# Patient Record
Sex: Male | Born: 1945 | Race: Black or African American | Hispanic: No | Marital: Married | Smoking: Never smoker
Health system: Southern US, Community
[De-identification: ages and names within clinical notes are randomized; demographics above are authoritative.]

## PROBLEM LIST (undated history)

## (undated) DIAGNOSIS — E119 Type 2 diabetes mellitus without complications: Secondary | ICD-10-CM

## (undated) DIAGNOSIS — H269 Unspecified cataract: Secondary | ICD-10-CM

## (undated) DIAGNOSIS — K219 Gastro-esophageal reflux disease without esophagitis: Secondary | ICD-10-CM

## (undated) DIAGNOSIS — F329 Major depressive disorder, single episode, unspecified: Secondary | ICD-10-CM

## (undated) DIAGNOSIS — I10 Essential (primary) hypertension: Secondary | ICD-10-CM

## (undated) DIAGNOSIS — F419 Anxiety disorder, unspecified: Secondary | ICD-10-CM

## (undated) DIAGNOSIS — G8929 Other chronic pain: Secondary | ICD-10-CM

## (undated) DIAGNOSIS — K5790 Diverticulosis of intestine, part unspecified, without perforation or abscess without bleeding: Secondary | ICD-10-CM

## (undated) DIAGNOSIS — T7840XA Allergy, unspecified, initial encounter: Secondary | ICD-10-CM

## (undated) DIAGNOSIS — H409 Unspecified glaucoma: Secondary | ICD-10-CM

## (undated) DIAGNOSIS — K589 Irritable bowel syndrome without diarrhea: Secondary | ICD-10-CM

## (undated) DIAGNOSIS — F32A Depression, unspecified: Secondary | ICD-10-CM

## (undated) DIAGNOSIS — J45909 Unspecified asthma, uncomplicated: Secondary | ICD-10-CM

## (undated) DIAGNOSIS — J189 Pneumonia, unspecified organism: Secondary | ICD-10-CM

## (undated) DIAGNOSIS — R51 Headache: Secondary | ICD-10-CM

## (undated) DIAGNOSIS — G4730 Sleep apnea, unspecified: Secondary | ICD-10-CM

## (undated) DIAGNOSIS — E785 Hyperlipidemia, unspecified: Secondary | ICD-10-CM

## (undated) DIAGNOSIS — R519 Headache, unspecified: Secondary | ICD-10-CM

## (undated) DIAGNOSIS — I499 Cardiac arrhythmia, unspecified: Secondary | ICD-10-CM

## (undated) DIAGNOSIS — J449 Chronic obstructive pulmonary disease, unspecified: Secondary | ICD-10-CM

## (undated) HISTORY — DX: Headache: R51

## (undated) HISTORY — DX: Other chronic pain: G89.29

## (undated) HISTORY — DX: Hyperlipidemia, unspecified: E78.5

## (undated) HISTORY — DX: Allergy, unspecified, initial encounter: T78.40XA

## (undated) HISTORY — DX: Chronic obstructive pulmonary disease, unspecified: J44.9

## (undated) HISTORY — DX: Headache, unspecified: R51.9

## (undated) HISTORY — DX: Cardiac arrhythmia, unspecified: I49.9

## (undated) HISTORY — DX: Depression, unspecified: F32.A

## (undated) HISTORY — DX: Unspecified glaucoma: H40.9

## (undated) HISTORY — DX: Type 2 diabetes mellitus without complications: E11.9

## (undated) HISTORY — DX: Pneumonia, unspecified organism: J18.9

## (undated) HISTORY — DX: Unspecified cataract: H26.9

## (undated) HISTORY — DX: Sleep apnea, unspecified: G47.30

## (undated) HISTORY — PX: OTHER SURGICAL HISTORY: SHX169

## (undated) HISTORY — DX: Diverticulosis of intestine, part unspecified, without perforation or abscess without bleeding: K57.90

## (undated) HISTORY — DX: Anxiety disorder, unspecified: F41.9

## (undated) HISTORY — DX: Gastro-esophageal reflux disease without esophagitis: K21.9

## (undated) HISTORY — PX: COLONOSCOPY: SHX174

## (undated) HISTORY — DX: Major depressive disorder, single episode, unspecified: F32.9

## (undated) HISTORY — DX: Irritable bowel syndrome, unspecified: K58.9

## (undated) HISTORY — DX: Essential (primary) hypertension: I10

## (undated) HISTORY — PX: ESOPHAGOGASTRODUODENOSCOPY ENDOSCOPY: SHX5814

## (undated) HISTORY — DX: Unspecified asthma, uncomplicated: J45.909

---

## 1999-04-22 ENCOUNTER — Ambulatory Visit (HOSPITAL_COMMUNITY): Admission: RE | Admit: 1999-04-22 | Discharge: 1999-04-22 | Payer: Self-pay | Admitting: Gastroenterology

## 2000-06-08 ENCOUNTER — Emergency Department (HOSPITAL_COMMUNITY): Admission: EM | Admit: 2000-06-08 | Discharge: 2000-06-08 | Payer: Self-pay | Admitting: Emergency Medicine

## 2000-06-11 ENCOUNTER — Encounter: Payer: Self-pay | Admitting: Pulmonary Disease

## 2000-06-11 ENCOUNTER — Ambulatory Visit (HOSPITAL_COMMUNITY): Admission: RE | Admit: 2000-06-11 | Discharge: 2000-06-11 | Payer: Self-pay | Admitting: Pulmonary Disease

## 2001-04-13 ENCOUNTER — Encounter: Payer: Self-pay | Admitting: Urology

## 2001-04-13 ENCOUNTER — Encounter: Admission: RE | Admit: 2001-04-13 | Discharge: 2001-04-13 | Payer: Self-pay | Admitting: Urology

## 2001-05-20 ENCOUNTER — Ambulatory Visit (HOSPITAL_BASED_OUTPATIENT_CLINIC_OR_DEPARTMENT_OTHER): Admission: RE | Admit: 2001-05-20 | Discharge: 2001-05-20 | Payer: Self-pay | Admitting: Urology

## 2002-12-19 ENCOUNTER — Ambulatory Visit (HOSPITAL_COMMUNITY): Admission: RE | Admit: 2002-12-19 | Discharge: 2002-12-19 | Payer: Self-pay | Admitting: Pulmonary Disease

## 2002-12-19 ENCOUNTER — Encounter: Payer: Self-pay | Admitting: Pulmonary Disease

## 2003-02-03 ENCOUNTER — Ambulatory Visit (HOSPITAL_COMMUNITY): Admission: RE | Admit: 2003-02-03 | Discharge: 2003-02-03 | Payer: Self-pay | Admitting: Pulmonary Disease

## 2003-02-03 ENCOUNTER — Encounter: Payer: Self-pay | Admitting: Pulmonary Disease

## 2005-04-15 ENCOUNTER — Ambulatory Visit: Payer: Self-pay | Admitting: Gastroenterology

## 2005-04-30 ENCOUNTER — Encounter (INDEPENDENT_AMBULATORY_CARE_PROVIDER_SITE_OTHER): Payer: Self-pay | Admitting: *Deleted

## 2005-04-30 ENCOUNTER — Ambulatory Visit: Payer: Self-pay | Admitting: Gastroenterology

## 2005-11-06 ENCOUNTER — Ambulatory Visit (HOSPITAL_COMMUNITY): Admission: RE | Admit: 2005-11-06 | Discharge: 2005-11-06 | Payer: Self-pay | Admitting: Pulmonary Disease

## 2005-11-18 ENCOUNTER — Ambulatory Visit: Payer: Self-pay | Admitting: Gastroenterology

## 2005-11-19 ENCOUNTER — Encounter (INDEPENDENT_AMBULATORY_CARE_PROVIDER_SITE_OTHER): Payer: Self-pay | Admitting: *Deleted

## 2005-11-19 ENCOUNTER — Ambulatory Visit: Payer: Self-pay | Admitting: Gastroenterology

## 2005-12-05 ENCOUNTER — Ambulatory Visit: Payer: Self-pay | Admitting: Gastroenterology

## 2006-07-14 HISTORY — PX: ROTATOR CUFF REPAIR: SHX139

## 2008-07-14 HISTORY — PX: LUMBAR SPINE SURGERY: SHX701

## 2008-12-04 ENCOUNTER — Encounter: Admission: RE | Admit: 2008-12-04 | Discharge: 2008-12-04 | Payer: Self-pay | Admitting: Pulmonary Disease

## 2008-12-13 ENCOUNTER — Encounter: Admission: RE | Admit: 2008-12-13 | Discharge: 2008-12-13 | Payer: Self-pay | Admitting: Orthopedic Surgery

## 2009-01-08 ENCOUNTER — Encounter: Admission: RE | Admit: 2009-01-08 | Discharge: 2009-01-30 | Payer: Self-pay | Admitting: Orthopedic Surgery

## 2009-03-09 ENCOUNTER — Ambulatory Visit (HOSPITAL_COMMUNITY): Admission: RE | Admit: 2009-03-09 | Discharge: 2009-03-09 | Payer: Self-pay | Admitting: Pulmonary Disease

## 2009-04-05 ENCOUNTER — Encounter: Admission: RE | Admit: 2009-04-05 | Discharge: 2009-05-05 | Payer: Self-pay | Admitting: Orthopedic Surgery

## 2009-04-22 ENCOUNTER — Emergency Department (HOSPITAL_COMMUNITY): Admission: EM | Admit: 2009-04-22 | Discharge: 2009-04-22 | Payer: Self-pay | Admitting: Emergency Medicine

## 2009-04-25 ENCOUNTER — Ambulatory Visit (HOSPITAL_COMMUNITY): Admission: RE | Admit: 2009-04-25 | Discharge: 2009-04-25 | Payer: Self-pay | Admitting: Pulmonary Disease

## 2009-04-26 ENCOUNTER — Ambulatory Visit (HOSPITAL_COMMUNITY): Admission: RE | Admit: 2009-04-26 | Discharge: 2009-04-26 | Payer: Self-pay | Admitting: Pulmonary Disease

## 2009-04-27 ENCOUNTER — Ambulatory Visit (HOSPITAL_COMMUNITY): Admission: RE | Admit: 2009-04-27 | Discharge: 2009-04-28 | Payer: Self-pay | Admitting: Neurosurgery

## 2010-04-10 ENCOUNTER — Ambulatory Visit (HOSPITAL_BASED_OUTPATIENT_CLINIC_OR_DEPARTMENT_OTHER): Admission: RE | Admit: 2010-04-10 | Discharge: 2010-04-10 | Payer: Self-pay | Admitting: Pulmonary Disease

## 2010-04-13 ENCOUNTER — Ambulatory Visit: Payer: Self-pay | Admitting: Internal Medicine

## 2010-10-17 LAB — CBC
MCHC: 34.2 g/dL (ref 30.0–36.0)
MCV: 91.4 fL (ref 78.0–100.0)
Platelets: 274 10*3/uL (ref 150–400)
RDW: 12.8 % (ref 11.5–15.5)
WBC: 6.7 10*3/uL (ref 4.0–10.5)

## 2010-10-17 LAB — BASIC METABOLIC PANEL
CO2: 27 mEq/L (ref 19–32)
GFR calc Af Amer: >60 mL/min (ref >60)
GFR calc non Af Amer: >60 mL/min (ref >60)
Glucose, Bld: 133 mg/dL — ABNORMAL HIGH (ref 70–99)
Sodium: 141 mEq/L (ref 135–145)

## 2010-10-17 LAB — GLUCOSE, CAPILLARY
Glucose-Capillary: 121 mg/dL — ABNORMAL HIGH (ref 70–99)
Glucose-Capillary: 110 mg/dL — ABNORMAL HIGH (ref 70–99)

## 2010-10-17 LAB — POCT I-STAT, CHEM 8
BUN: 14 mg/dL (ref 6–23)
Creatinine, Ser: 1 mg/dL (ref 0.4–1.5)
HCT: 42 % (ref 39.0–52.0)
Sodium: 141 mEq/L (ref 135–145)

## 2010-11-29 NOTE — Op Note (Signed)
[facility]  Patient:    Derek [name], Derek [name] Visit Number: [id number] MRN: [id number]          Service Type: NES Location: [facility] Attending Physician:  Lindaann Slough Proc. Date: 05/20/01 Admit Date:  05/20/2001                             Operative Report  PREOPERATIVE DIAGNOSES: 1. Urinary tract infection. 2. Benign prostatic hypertrophy.  POSTOPERATIVE DIAGNOSES: 1. Urinary tract infection. 2. Benign prostatic hypertrophy. 3. Bladder diverticula.  PROCEDURE:  Cystoscopy.  SURGEON:  Lindaann Slough, M.D.  ANESTHESIA:  General.  INDICATION:  The patient is a 65 year old male, who [facility]  He had symptoms of frequency, hesitancy, and straining on urination.  An IVP showed a cyst in the right kidney and a bladder diverticulum.  The patient is scheduled today for cystoscopy.  DESCRIPTION OF PROCEDURE:  Under general anesthesia, the patient was prepped and draped and placed in the dorsal lithotomy position.  A #22 Wappler cystoscope was inserted in the bladder.  The patient has trilobar prostatic hypertrophy.  The bladder is trabeculated.  There are two diverticula in the bladder.  Each diverticulum is above each ureteral orifice.  There is no stone or tumor in the bladder nor in the diverticula.  The ureteral orifices are in normal position and shape with clear efflux.  The bladder was then emptied and the cystoscope removed.  The patient tolerated the procedure well and left the OR in satisfactory condition to postanesthesia care unit. Attending Physician:  Lindaann Slough DD:  05/20/01 TD:  05/21/01 Job: [id number]

## 2011-08-29 ENCOUNTER — Other Ambulatory Visit: Payer: Self-pay | Admitting: Dermatology

## 2011-11-13 ENCOUNTER — Other Ambulatory Visit: Payer: Self-pay | Admitting: Neurosurgery

## 2011-11-13 DIAGNOSIS — M5416 Radiculopathy, lumbar region: Secondary | ICD-10-CM

## 2011-11-24 ENCOUNTER — Ambulatory Visit
Admission: RE | Admit: 2011-11-24 | Discharge: 2011-11-24 | Disposition: A | Discharge status: Home / Self Care | Payer: Medicare Other | Source: Ambulatory Visit | Attending: Neurosurgery | Admitting: Neurosurgery

## 2011-11-24 DIAGNOSIS — M5416 Radiculopathy, lumbar region: Secondary | ICD-10-CM

## 2011-11-24 MED ORDER — GADOBENATE DIMEGLUMINE 529 MG/ML IV SOLN
17.0000 mL | Freq: Once | INTRAVENOUS | Status: AC | PRN
  Administered 2011-11-24: 17 mL via INTRAVENOUS

## 2012-09-04 ENCOUNTER — Ambulatory Visit (INDEPENDENT_AMBULATORY_CARE_PROVIDER_SITE_OTHER): Payer: Medicare Other | Admitting: Family Medicine

## 2012-09-04 VITALS — BP 130/83 | HR 98 | Temp 98.5°F | Resp 12 | Ht 69.0 in | Wt 178.0 lb

## 2012-09-04 DIAGNOSIS — R197 Diarrhea, unspecified: Secondary | ICD-10-CM

## 2012-09-04 DIAGNOSIS — R1032 Left lower quadrant pain: Secondary | ICD-10-CM

## 2012-09-04 DIAGNOSIS — Z8719 Personal history of other diseases of the digestive system: Secondary | ICD-10-CM

## 2012-09-04 DIAGNOSIS — K5732 Diverticulitis of large intestine without perforation or abscess without bleeding: Secondary | ICD-10-CM

## 2012-09-04 LAB — POCT CBC
Hemoglobin: 14.4 g/dL (ref 14.1–18.1)
Lymph, poc: 1.6 (ref 0.6–3.4)
MCHC: 32.3 g/dL (ref 31.8–35.4)
MID (cbc): 0.6 (ref 0–0.9)
MPV: 10.5 fL (ref 0–99.8)
POC Granulocyte: 4.7 (ref 2–6.9)
POC LYMPH PERCENT: 23.2 %L (ref 10–50)
POC MID %: 8.1 %M (ref 0–12)
Platelet Count, POC: 302 10*3/uL (ref 142–424)
RDW, POC: 13.4 %

## 2012-09-04 LAB — COMPREHENSIVE METABOLIC PANEL
ALT: 24 U/L (ref 0–53)
AST: 22 U/L (ref 0–37)
Alkaline Phosphatase: 108 U/L (ref 39–117)
Creat: 0.87 mg/dL (ref 0.50–1.35)
Sodium: 139 mEq/L (ref 135–145)
Total Bilirubin: 0.4 mg/dL (ref 0.3–1.2)
Total Protein: 7.8 g/dL (ref 6.0–8.3)

## 2012-09-04 MED ORDER — CIPROFLOXACIN HCL 500 MG PO TABS: 500.0000 mg | ORAL_TABLET | Freq: Two times a day (BID) | ORAL | Status: DC

## 2012-09-04 MED ORDER — DICYCLOMINE HCL 10 MG PO CAPS: ORAL_CAPSULE | ORAL | Status: DC

## 2012-09-04 NOTE — Progress Notes (Signed)
Subjective: 67 year old man who is here with history of diarrhea for the past 10 days or so. He been doing okay before that. Over 20 years ago he had some problem with ulcers. He was treated for that. Off and on he has been had having some episodes of diarrhea. A few years ago he may have had a ten-day stretch of it. However over the last 10 days he is persisted with loose bowel movements, sometimes 3 or more times a day. They're usually a dumping syndrome type of thing. When he eats, he gets pain in his left lower quadrant about an hour later. He then moves his bowels and feels better. He's not been seen blood is a little bit of pain 1 day, just a speck of it.   He is retired, generally does pretty well. He is on medication for his blood pressure, nerves and and a few other things. He last had a colonoscopy about 7 years ago. He did have some diverticulosis on that. He has been drinking Ensure and Atkins Adavantage.  He doesn't have much of an appetite. However his weight is about at the lower end of the usual range that he has.  Objective: Well-developed well-nourished alert gentleman in no obvious distress. Neck supple without nodes. Throat clear. Chest clear. Heart regular without murmurs. And soft without masses or major tenderness. Mild tenderness in the left mid descending colon region. Bowel sounds a little bit active. Digital rectal exam was normal with an unremarkable prostate gland. Only trace of stool was present, but Hemoccult was taken.  Assessment: Left abdominal pain Diarrhea History of peptic ulcer disease History of diverticulosis/diverticulitis History of irritable bowel syndrome  Plan: Check labs and decide treat  Results for orders placed in visit on 09/04/12  POCT CBC      Result Value Range   WBC 6.9  4.6 - 10.2 K/uL   Lymph, poc 1.6  0.6 - 3.4   POC LYMPH PERCENT 23.2  10 - 50 %L   MID (cbc) 0.6  0 - 0.9   POC MID % 8.1  0 - 12 %M   POC Granulocyte 4.7  2 - 6.9   Granulocyte percent 68.7  37 - 80 %G   RBC 4.88  4.69 - 6.13 M/uL   Hemoglobin 14.4  14.1 - 18.1 g/dL   HCT, POC 16.1  09.6 - 53.7 %   MCV 91.3  80 - 97 fL   MCH, POC 29.5  27 - 31.2 pg   MCHC 32.3  31.8 - 35.4 g/dL   RDW, POC 04.5     Platelet Count, POC 302  142 - 424 K/uL   MPV 10.5  0 - 99.8 fL  IFOBT (OCCULT BLOOD)      Result Value Range   IFOBT Negative     Irritable bowel syndrome Normal diverticulitis  Plan: Bentyl 10 before meals and at bedtime Cipro 500 twice a day  Return for

## 2012-09-04 NOTE — Patient Instructions (Signed)
Continue to drink plenty of fluids.  Avoid dairy products   Gradually advance diet with bland type foods initially  Return if worse

## 2012-09-06 ENCOUNTER — Encounter: Payer: Self-pay | Admitting: *Deleted

## 2013-03-07 ENCOUNTER — Other Ambulatory Visit: Payer: Self-pay | Admitting: *Deleted

## 2013-03-07 DIAGNOSIS — E119 Type 2 diabetes mellitus without complications: Secondary | ICD-10-CM | POA: Insufficient documentation

## 2013-03-08 ENCOUNTER — Other Ambulatory Visit (INDEPENDENT_AMBULATORY_CARE_PROVIDER_SITE_OTHER): Payer: Medicare Other

## 2013-03-08 ENCOUNTER — Other Ambulatory Visit: Payer: Self-pay | Admitting: *Deleted

## 2013-03-08 DIAGNOSIS — E119 Type 2 diabetes mellitus without complications: Secondary | ICD-10-CM

## 2013-03-08 LAB — URINALYSIS
Hgb urine dipstick: NEGATIVE
Nitrite: NEGATIVE
Specific Gravity, Urine: 1.015 (ref 1.000–1.030)
Urine Glucose: NEGATIVE
Urobilinogen, UA: 0.2 (ref 0.0–1.0)

## 2013-03-08 LAB — POTASSIUM: Potassium: 3.7 mEq/L (ref 3.5–5.1)

## 2013-03-08 LAB — MICROALBUMIN / CREATININE URINE RATIO
Creatinine,U: 119.1 mg/dL
Microalb, Ur: 0.5 mg/dL (ref 0.0–1.9)

## 2013-03-10 ENCOUNTER — Ambulatory Visit: Payer: PRIVATE HEALTH INSURANCE | Admitting: Endocrinology

## 2013-03-16 ENCOUNTER — Encounter: Payer: Self-pay | Admitting: Endocrinology

## 2013-03-16 ENCOUNTER — Ambulatory Visit (INDEPENDENT_AMBULATORY_CARE_PROVIDER_SITE_OTHER): Payer: Medicare Other | Admitting: Endocrinology

## 2013-03-16 VITALS — BP 118/74 | HR 86 | Temp 98.1°F | Resp 12 | Ht 70.0 in | Wt 184.2 lb

## 2013-03-16 DIAGNOSIS — E7800 Pure hypercholesterolemia, unspecified: Secondary | ICD-10-CM

## 2013-03-16 DIAGNOSIS — E119 Type 2 diabetes mellitus without complications: Secondary | ICD-10-CM

## 2013-03-16 DIAGNOSIS — E785 Hyperlipidemia, unspecified: Secondary | ICD-10-CM | POA: Insufficient documentation

## 2013-03-16 DIAGNOSIS — I10 Essential (primary) hypertension: Secondary | ICD-10-CM

## 2013-03-16 NOTE — Patient Instructions (Signed)
Please check blood sugars at least half the time about 2 hours after any meal and as directed on waking up.   Please bring blood sugar monitor to each visit  

## 2013-03-16 NOTE — Progress Notes (Signed)
Patient ID: Derek [name], male   DOB: [birth year], 67 y.o.   MRN: [id number]  Derek [name] is an 67 y.o. male.   Reason for Appointment: Diabetes follow-up   History of Present Illness   Diagnosis: Type 2 DIABETES MELITUS, date of diagnosis: 2008      Past history: Although his glucose was 600 at diagnosis he was initially treated with metformin and subsequently had relatively milder diabetes Januvia was added to his regimen in 2011  He has had relatively mild diabetes which is consistently well controlled with regimen of Janumet twice a day Usually has upper normal A1c He has checked blood sugar somewhat sporadically and overall does not appear to be as compliant as before      Side effects from medications: None      Monitors blood glucose:  less than once a day.    Glucometer:  FreeStyle           Blood Glucose readings from meter download: readings before breakfast: 118-121,  afternoon 72, evening 100 Hypoglycemia frequency: Never.          Meals: 3 meals per day.  eating out more  and not consistently following diet        Physical activity: exercise:  Less walking recently           Dietician visit: Most recent: Unknown         Complications: are: None    Wt Readings from Last 3 Encounters:  03/16/13 184 lb 3.2 oz (83.553 kg)  09/04/12 178 lb (80.74 kg)    No visits with results within 1 Week(s) from this visit. Latest known visit with results is:  Appointment on 03/08/2013  Component Date Value Range Status  . Hemoglobin A1C 03/08/2013 6.5  4.6 - 6.5 % Final   Glycemic Control Guidelines for People with Diabetes:Non Diabetic:  <6%Goal of Therapy: <7%Additional Action Suggested:  >8%   . Microalb, Ur 03/08/2013 0.5  0.0 - 1.9 mg/dL Final  . Creatinine,U 40/98/1191 119.1   Final  . Microalb Creat Ratio 03/08/2013 0.4  0.0 - 30.0 mg/g Final  . Color, Urine 03/08/2013 LT. YELLOW  Yellow;Lt. Yellow Final  . APPearance 03/08/2013 CLEAR  Clear Final  . Specific Gravity, Urine  03/08/2013 1.015  1.000-1.030 Final  . pH 03/08/2013 6.0  5.0 - 8.0 Final  . Total Protein, Urine 03/08/2013 NEGATIVE  Negative Final  . Urine Glucose 03/08/2013 NEGATIVE  Negative Final  . Ketones, ur 03/08/2013 NEGATIVE  Negative Final  . Bilirubin Urine 03/08/2013 NEGATIVE  Negative Final  . Hgb urine dipstick 03/08/2013 NEGATIVE  Negative Final  . Urobilinogen, UA 03/08/2013 0.2  0.0 - 1.0 Final  . Leukocytes, UA 03/08/2013 NEGATIVE  Negative Final  . Nitrite 03/08/2013 NEGATIVE  Negative Final  . Potassium 03/08/2013 3.7  3.5 - 5.1 mEq/L Final      Medication List       This list is accurate as of: 03/16/13  2:01 PM.  Always use your most recent med list.               aspirin 81 MG tablet  Take 81 mg by mouth daily.     bimatoprost 0.03 % ophthalmic solution  Commonly known as:  LUMIGAN  1 drop at bedtime.     cetirizine 10 MG tablet  Commonly known as:  ZYRTEC  Take 10 mg by mouth daily.     chlordiazePOXIDE 10 MG capsule  Commonly known as:  LIBRIUM  Take 10 mg by mouth daily.     ciprofloxacin 500 MG tablet  Commonly known as:  CIPRO  Take 1 tablet (500 mg total) by mouth 2 (two) times daily.     cyclobenzaprine 5 MG tablet  Commonly known as:  FLEXERIL  Take 5 mg by mouth at bedtime.     diclofenac 50 MG EC tablet  Commonly known as:  VOLTAREN  Take 50 mg by mouth daily.     dicyclomine 10 MG capsule  Commonly known as:  BENTYL  Use one before meals and at bedtime as needed for diarrhea or cramping     esomeprazole 20 MG capsule  Commonly known as:  NEXIUM  Take 20 mg by mouth daily before breakfast.     HYDROcodone-acetaminophen 5-325 MG per tablet  Commonly known as:  NORCO/VICODIN  Take 1 tablet by mouth every 6 (six) hours as needed for pain.     irbesartan-hydrochlorothiazide 150-12.5 MG per tablet  Commonly known as:  AVALIDE  Take 1 tablet by mouth daily.     JANUMET 50-1000 MG per tablet  Generic drug:  sitaGLIPtin-metformin  Take 1  tablet by mouth daily.     metoprolol succinate 25 MG 24 hr tablet  Commonly known as:  TOPROL-XL  Take 25 mg by mouth daily.     potassium chloride 10 MEQ tablet  Commonly known as:  K-DUR,KLOR-CON  Take 10 mEq by mouth 2 (two) times daily.     ranitidine 150 MG capsule  Commonly known as:  ZANTAC  Take 150 mg by mouth 2 (two) times daily.     sertraline 100 MG tablet  Commonly known as:  ZOLOFT  Take 100 mg by mouth daily.     tamsulosin 0.4 MG Caps capsule  Commonly known as:  FLOMAX  Take 0.4 mg by mouth.     TraZODone HCl 150 MG Tb24  Take 150 mg by mouth.     triamcinolone cream 0.1 %  Commonly known as:  KENALOG  Apply 1 application topically daily.     zolpidem 10 MG tablet  Commonly known as:  AMBIEN  Take 10 mg by mouth at bedtime as needed for sleep.        Allergies:  Allergies  Allergen Reactions  . Penicillins Rash    No past medical history on file.  No past surgical history on file.  No family history on file.  Social History:  reports that he has never smoked. He has never used smokeless tobacco. His alcohol and drug histories are not on file.  Review of Systems:  HYPERTENSION:  appears well controlled with Avalide which he has taken for years   HYPERLIPIDEMIA: The lipid abnormality consists of elevated LDL treated with simvastatin.     Examination:   BP 118/74  Pulse 86  Temp(Src) 98.1 F (36.7 C)  Resp 12  Ht 5\' 10"  (1.778 m)  Wt 184 lb 3.2 oz (83.553 kg)  BMI 26.43 kg/m2  SpO2 96%  Body mass index is 26.43 kg/(m^2).   ASSESSMENT/ PLAN::   Diabetes type 2   The patient's diabetes control appears to be well controlled with near-normal blood sugars  Although he has an A1c of 6.5 recently has not been very compliant with his diet and exercise regimen and he feels like he can improve He will call if blood sugars are unusually high and also discussed checking more consistently including postprandial No obvious diabetes  complications  Derek [name] 03/16/2013, 2:01  PM    

## 2013-07-19 ENCOUNTER — Other Ambulatory Visit (INDEPENDENT_AMBULATORY_CARE_PROVIDER_SITE_OTHER): Payer: Medicare Other

## 2013-07-19 DIAGNOSIS — E7800 Pure hypercholesterolemia, unspecified: Secondary | ICD-10-CM

## 2013-07-19 DIAGNOSIS — E119 Type 2 diabetes mellitus without complications: Secondary | ICD-10-CM

## 2013-07-19 LAB — COMPREHENSIVE METABOLIC PANEL
ALBUMIN: 4.3 g/dL (ref 3.5–5.2)
ALK PHOS: 98 U/L (ref 39–117)
ALT: 23 U/L (ref 0–53)
AST: 22 U/L (ref 0–37)
BILIRUBIN TOTAL: 0.4 mg/dL (ref 0.3–1.2)
BUN: 11 mg/dL (ref 6–23)
CO2: 29 mEq/L (ref 19–32)
Calcium: 10.6 mg/dL — ABNORMAL HIGH (ref 8.4–10.5)
Chloride: 105 mEq/L (ref 96–112)
Creatinine, Ser: 1 mg/dL (ref 0.4–1.5)
GFR: 92.14 mL/min (ref >60.00)
GLUCOSE: 114 mg/dL — AB (ref 70–99)
POTASSIUM: 4.2 meq/L (ref 3.5–5.1)
SODIUM: 143 meq/L (ref 135–145)
TOTAL PROTEIN: 8 g/dL (ref 6.0–8.3)

## 2013-07-19 LAB — HEMOGLOBIN A1C: Hgb A1c MFr Bld: 6.4 % (ref 4.6–6.5)

## 2013-07-19 LAB — LIPID PANEL
CHOL/HDL RATIO: 4
CHOLESTEROL: 149 mg/dL (ref 0–200)
HDL: 33.4 mg/dL — ABNORMAL LOW (ref >39.00)
LDL CALC: 89 mg/dL (ref 0–99)
Triglycerides: 133 mg/dL (ref 0.0–149.0)
VLDL: 26.6 mg/dL (ref 0.0–40.0)

## 2013-07-20 ENCOUNTER — Encounter: Payer: Self-pay | Admitting: Endocrinology

## 2013-07-20 ENCOUNTER — Ambulatory Visit (INDEPENDENT_AMBULATORY_CARE_PROVIDER_SITE_OTHER): Payer: Medicare Other | Admitting: Endocrinology

## 2013-07-20 VITALS — BP 124/70 | HR 78 | Temp 98.2°F | Resp 14 | Ht 70.0 in | Wt 188.2 lb

## 2013-07-20 DIAGNOSIS — I10 Essential (primary) hypertension: Secondary | ICD-10-CM

## 2013-07-20 DIAGNOSIS — E119 Type 2 diabetes mellitus without complications: Secondary | ICD-10-CM

## 2013-07-20 DIAGNOSIS — E7800 Pure hypercholesterolemia, unspecified: Secondary | ICD-10-CM

## 2013-07-20 NOTE — Patient Instructions (Addendum)
Walk daily  Please check blood sugars at least half the time about 2 hours after any meal and as directed on waking up. Please bring blood sugar monitor to each visit   

## 2013-07-20 NOTE — Progress Notes (Signed)
Patient ID: Derek [name], male   DOB: 1946/02/14, 68 y.o.   MRN: [id number]  Reason for Appointment: Diabetes follow-up   History of Present Illness   Diagnosis: Type 2 DIABETES MELITUS, date of diagnosis: 2008      Past history: Although his glucose was 600 at diagnosis he was initially treated with metformin and subsequently had relatively milder diabetes Januvia was added to his regimen in 2011  He has had relatively mild diabetes which is consistently well controlled with regimen of Janumet twice a day Usually has upper normal A1c He has checked blood sugar somewhat sporadically and do not have meter for review Has gained weight from inconsistent diet and exercise     Side effects from medications: None      Monitors blood glucose:  less than once a day.    Glucometer:  FreeStyle           Blood Glucose readings from recall 130 or less; am readings are about 110    Hypoglycemia frequency: Never.          Meals: 3 meals per day.  eating out more  and not consistently following diet        Physical activity: exercise:  Less walking last month           Dietician visit: Most recent: Unknown         Complications: are: None    Wt Readings from Last 3 Encounters:  07/20/13 188 lb 3.2 oz (85.367 kg)  03/16/13 184 lb 3.2 oz (83.553 kg)  09/04/12 178 lb (80.74 kg)   Lab Results  Component Value Date   HGBA1C 6.4 07/19/2013   HGBA1C 6.5 03/08/2013   Lab Results  Component Value Date   MICROALBUR 0.5 03/08/2013   LDLCALC 89 07/19/2013   CREATININE 1.0 07/19/2013    Appointment on 07/19/2013  Component Date Value Range Status  . Sodium 07/19/2013 143  135 - 145 mEq/L Final  . Potassium 07/19/2013 4.2  3.5 - 5.1 mEq/L Final  . Chloride 07/19/2013 105  96 - 112 mEq/L Final  . CO2 07/19/2013 29  19 - 32 mEq/L Final  . Glucose, Bld 07/19/2013 114* 70 - 99 mg/dL Final  . BUN 40/98/1191 11  6 - 23 mg/dL Final  . Creatinine, Ser 07/19/2013 1.0  0.4 - 1.5 mg/dL Final  . Total Bilirubin  07/19/2013 0.4  0.3 - 1.2 mg/dL Final  . Alkaline Phosphatase 07/19/2013 98  39 - 117 U/L Final  . AST 07/19/2013 22  0 - 37 U/L Final  . ALT 07/19/2013 23  0 - 53 U/L Final  . Total Protein 07/19/2013 8.0  6.0 - 8.3 g/dL Final  . Albumin 47/82/9562 4.3  3.5 - 5.2 g/dL Final  . Calcium 13/02/6577 10.6* 8.4 - 10.5 mg/dL Final  . GFR 46/96/2952 92.14  >60.00 mL/min Final  . Cholesterol 07/19/2013 149  0 - 200 mg/dL Final   ATP III Classification       Desirable:  < 200 mg/dL               Borderline High:  200 - 239 mg/dL          High:  > = 841 mg/dL  . Triglycerides 07/19/2013 133.0  0.0 - 149.0 mg/dL Final   Normal:  <324 mg/dLBorderline High:  150 - 199 mg/dL  . HDL 07/19/2013 33.40* >39.00 mg/dL Final  . VLDL 40/04/2724 26.6  0.0 - 40.0 mg/dL Final  .  LDL Cholesterol 07/19/2013 89  0 - 99 mg/dL Final  . Total CHOL/HDL Ratio 07/19/2013 4   Final                  Men          Women1/2 Average Risk     3.4          3.3Average Risk          5.0          4.42X Average Risk          9.6          7.13X Average Risk          15.0          11.0                      . Hemoglobin A1C 07/19/2013 6.4  4.6 - 6.5 % Final   Glycemic Control Guidelines for People with Diabetes:Non Diabetic:  <6%Goal of Therapy: <7%Additional Action Suggested:  >8%       Medication List       This list is accurate as of: 07/20/13  1:46 PM.  Always use your most recent med list.               aspirin 81 MG tablet  Take 81 mg by mouth daily.     bimatoprost 0.03 % ophthalmic solution  Commonly known as:  LUMIGAN  1 drop at bedtime.     cetirizine 10 MG tablet  Commonly known as:  ZYRTEC  Take 10 mg by mouth daily.     chlordiazePOXIDE 10 MG capsule  Commonly known as:  LIBRIUM  Take 10 mg by mouth daily.     cyclobenzaprine 5 MG tablet  Commonly known as:  FLEXERIL  Take 5 mg by mouth at bedtime.     diclofenac 50 MG EC tablet  Commonly known as:  VOLTAREN  Take 50 mg by mouth daily.      dicyclomine 10 MG capsule  Commonly known as:  BENTYL  Use one before meals and at bedtime as needed for diarrhea or cramping     esomeprazole 20 MG capsule  Commonly known as:  NEXIUM  Take 20 mg by mouth daily before breakfast.     HYDROcodone-acetaminophen 5-325 MG per tablet  Commonly known as:  NORCO/VICODIN  Take 1 tablet by mouth every 6 (six) hours as needed for pain.     irbesartan-hydrochlorothiazide 150-12.5 MG per tablet  Commonly known as:  AVALIDE  Take 1 tablet by mouth daily.     JANUMET 50-1000 MG per tablet  Generic drug:  sitaGLIPtin-metformin  Take 1 tablet by mouth daily.     metoprolol succinate 25 MG 24 hr tablet  Commonly known as:  TOPROL-XL  Take 25 mg by mouth daily.     potassium chloride 10 MEQ tablet  Commonly known as:  K-DUR,KLOR-CON  Take 10 mEq by mouth 2 (two) times daily.     ranitidine 150 MG capsule  Commonly known as:  ZANTAC  Take 150 mg by mouth 2 (two) times daily.     sertraline 100 MG tablet  Commonly known as:  ZOLOFT  Take 100 mg by mouth daily.     simvastatin 40 MG tablet  Commonly known as:  ZOCOR  Take 40 mg by mouth every evening.     tamsulosin 0.4 MG Caps capsule  Commonly known as:  FLOMAX  Take 0.4 mg by mouth.     TraZODone HCl 150 MG Tb24  Take 150 mg by mouth.     zolpidem 10 MG tablet  Commonly known as:  AMBIEN  Take 10 mg by mouth at bedtime as needed for sleep.        Allergies:  Allergies  Allergen Reactions  . Penicillins Rash    No past medical history on file.  No past surgical history on file.  No family history on file.  Social History:  reports that he has never smoked. He has never used smokeless tobacco. His alcohol and drug histories are not on file.  Review of Systems:  HYPERTENSION:  appears well controlled with Avalide which he has taken for years   HYPERLIPIDEMIA: The lipid abnormality consists of elevated LDL and low HDL treated with simvastatin.  Lab Results   Component Value Date   CHOL 149 07/19/2013   HDL 33.40* 07/19/2013   LDLCALC 89 07/19/2013   TRIG 133.0 07/19/2013   CHOLHDL 4 07/19/2013       Examination:   BP 124/70  Pulse 78  Temp(Src) 98.2 F (36.8 C)  Resp 14  Ht 5\' 10"  (1.778 m)  Wt 188 lb 3.2 oz (85.367 kg)  BMI 27.00 kg/m2  SpO2 98%  Body mass index is 27 kg/(m^2).   ASSESSMENT/ PLAN::   Diabetes type 2   The patient's diabetes control appears to be  Reasonably well controlled with near-normal blood sugars  and upper normal in A1c   He  has not been very compliant with his diet and exercise regimen and Has gained some weight gain Half discussed walking indoors during winter months and also discussed benefits of exercise and weight loss an HDL and cardiovascular events long-term Hypertension: Well controlled  HYPERCHOLESTEROLEMIA: LDL is at target but HDL needs improvement, previous levels not available for comparison  No obvious diabetes complications  Day Greb 07/20/2013, 1:46 PM

## 2013-07-21 ENCOUNTER — Other Ambulatory Visit: Payer: Self-pay | Admitting: *Deleted

## 2013-07-21 MED ORDER — GLUCOSE BLOOD VI STRP: ORAL_STRIP | Status: DC

## 2013-08-22 ENCOUNTER — Other Ambulatory Visit: Payer: Self-pay | Admitting: *Deleted

## 2013-08-22 MED ORDER — SITAGLIPTIN PHOS-METFORMIN HCL 50-1000 MG PO TABS: 1.0000 | ORAL_TABLET | Freq: Every day | ORAL | Status: DC

## 2013-10-01 ENCOUNTER — Ambulatory Visit (INDEPENDENT_AMBULATORY_CARE_PROVIDER_SITE_OTHER): Payer: Medicare Other | Admitting: Emergency Medicine

## 2013-10-01 VITALS — BP 110/72 | HR 76 | Temp 98.1°F | Resp 16 | Ht 69.25 in | Wt 182.0 lb

## 2013-10-01 DIAGNOSIS — B356 Tinea cruris: Secondary | ICD-10-CM

## 2013-10-01 DIAGNOSIS — L509 Urticaria, unspecified: Secondary | ICD-10-CM

## 2013-10-01 DIAGNOSIS — R972 Elevated prostate specific antigen [PSA]: Secondary | ICD-10-CM

## 2013-10-01 LAB — POCT CBC
Granulocyte percent: 67.1 %G (ref 37–80)
HCT, POC: 42.5 % — AB (ref 43.5–53.7)
Hemoglobin: 13.8 g/dL — AB (ref 14.1–18.1)
Lymph, poc: 1.9 (ref 0.6–3.4)
MCH, POC: 29.7 pg (ref 27–31.2)
MCHC: 32.5 g/dL (ref 31.8–35.4)
MCV: 91.7 fL (ref 80–97)
MID (cbc): 0.6 (ref 0–0.9)
MPV: 10.9 fL (ref 0–99.8)
PLATELET COUNT, POC: 275 10*3/uL (ref 142–424)
POC GRANULOCYTE: 5.2 (ref 2–6.9)
POC LYMPH %: 24.6 % (ref 10–50)
POC MID %: 8.3 %M (ref 0–12)
RBC: 4.64 M/uL — AB (ref 4.69–6.13)
RDW, POC: 14.6 %
WBC: 7.7 10*3/uL (ref 4.6–10.2)

## 2013-10-01 MED ORDER — MICONAZOLE NITRATE 2 % EX AERP: INHALATION_SPRAY | CUTANEOUS | Status: DC

## 2013-10-01 NOTE — Progress Notes (Signed)
[facility] [address], [place] [id number] [phone]  Date:  10/01/2013   Name:  Derek BoatmanFred A [name]   DOB:  17-Feb-1946   MRN:  [id number]  PCP:  Eino FarberKILPATRICK [name],Derek R, MD    Chief Complaint: Sore on Groin   History of Present Illness:  Derek BoatmanFred A [name] is a 68 y.o. very pleasant male patient who presents with the following:  Has a three month history of pruritis.  Has tried a number of OTC meds with no result.  No improvement with over the counter medications or other home remedies. Denies other complaint or health concern today.   Patient Active Problem List   Diagnosis Date Noted  . Unspecified essential hypertension 03/16/2013  . Pure hypercholesterolemia 03/16/2013  . Type II or unspecified type diabetes mellitus without mention of complication, not stated as uncontrolled 03/07/2013    History reviewed. No pertinent past medical history.  History reviewed. No pertinent past surgical history.  History  Substance Use Topics  . Smoking status: Never Smoker   . Smokeless tobacco: Never Used  . Alcohol Use: Not on file    History reviewed. No pertinent family history.  Allergies  Allergen Reactions  . Penicillins Rash    Medication list has been reviewed and updated.  Current Outpatient Prescriptions on File Prior to Visit  Medication Sig Dispense Refill  . aspirin 81 MG tablet Take 81 mg by mouth daily.      . bimatoprost (LUMIGAN) 0.03 % ophthalmic solution 1 drop at bedtime.      . cetirizine (ZYRTEC) 10 MG tablet Take 10 mg by mouth daily.      . chlordiazePOXIDE (LIBRIUM) 10 MG capsule Take 10 mg by mouth daily.      . cyclobenzaprine (FLEXERIL) 5 MG tablet Take 5 mg by mouth at bedtime.      . diclofenac (VOLTAREN) 50 MG EC tablet Take 50 mg by mouth daily.      Marland Kitchen. dicyclomine (BENTYL) 10 MG capsule Use one before meals and at bedtime as needed for diarrhea or cramping  30 capsule  0  . esomeprazole (NEXIUM) 20 MG capsule Take 20 mg by mouth  daily before breakfast.      . glucose blood (FREESTYLE LITE) test strip Use as instructed to check blood sugars 2 times per day dx code 250.00  100 each  5  . HYDROcodone-acetaminophen (NORCO/VICODIN) 5-325 MG per tablet Take 1 tablet by mouth every 6 (six) hours as needed for pain.      Marland Kitchen. irbesartan-hydrochlorothiazide (AVALIDE) 150-12.5 MG per tablet Take 1 tablet by mouth daily.      . metoprolol succinate (TOPROL-XL) 25 MG 24 hr tablet Take 25 mg by mouth daily.      . potassium chloride (K-DUR,KLOR-CON) 10 MEQ tablet Take 10 mEq by mouth 2 (two) times daily.      . ranitidine (ZANTAC) 150 MG capsule Take 150 mg by mouth 2 (two) times daily.      . sertraline (ZOLOFT) 100 MG tablet Take 100 mg by mouth daily.      . simvastatin (ZOCOR) 40 MG tablet Take 40 mg by mouth every evening.      . sitaGLIPtin-metformin (JANUMET) 50-1000 MG per tablet Take 1 tablet by mouth daily.  30 tablet  5  . tamsulosin (FLOMAX) 0.4 MG CAPS capsule Take 0.4 mg by mouth.      . TraZODone HCl 150 MG TB24 Take 150 mg by mouth.      .Marland Kitchen  zolpidem (AMBIEN) 10 MG tablet Take 10 mg by mouth at bedtime as needed for sleep.       No current facility-administered medications on file prior to visit.    Review of Systems:  As per HPI, otherwise negative.    Physical Examination: Filed Vitals:   10/01/13 1325  BP: 110/72  Pulse: 76  Temp: 98.1 F (36.7 C)  Resp: 16   Filed Vitals:   10/01/13 1325  Height: 5' 9.25" (1.759 m)  Weight: 182 lb (82.555 kg)   Body mass index is 26.68 kg/(m^2). Ideal Body Weight: Weight in (lb) to have BMI = 25: 170.2   GEN: WDWN, NAD, Non-toxic, Alert & Oriented x 3 HEENT: Atraumatic, Normocephalic.  Ears and Nose: No external deformity. EXTR: No clubbing/cyanosis/edema NEURO: Normal gait.  PSYCH: Normally interactive. Conversant. Not depressed or anxious appearing.  Calm demeanor.  SKIN:  Erythematous rash on groin.    Assessment and Plan: Tinea cruris   Signed,   Phillips Odor, MD

## 2013-10-01 NOTE — Addendum Note (Signed)
Addended by: Carmelina DaneANDERSON, Ario Mcdiarmid S on: 10/01/2013 02:03 PM   Modules accepted: Orders

## 2013-10-01 NOTE — Patient Instructions (Signed)
Jock Itch Jock itch is a fungal infection of the skin in the groin area. It is sometimes called "ringworm" even though it is not caused by a worm. A fungus is a type of germ that thrives in dark, damp places.  CAUSES  This infection may spread from:  A fungus infection elsewhere on the body (such as athlete's foot).  Sharing towels or clothing. This infection is more common in:  Hot, humid climates.  People who wear tight-fitting clothing or wet bathing suits for long periods of time.  Athletes.  Overweight people.  People with diabetes. SYMPTOMS  Jock itch causes the following symptoms:  Red, pink or brown rash in the groin. Rash may spread to the thighs, anus, and buttocks.  Itching. DIAGNOSIS  Your caregiver may make the diagnosis by looking at the rash. Sometimes a skin scraping will be sent to test for fungus. Testing can be done either by looking under the microscope or by doing a culture (test to try to grow the fungus). A culture can take up to 2 weeks to come back. TREATMENT  Jock itch may be treated with:  Skin cream or ointment to kill fungus.  Medicine by mouth to kill fungus.  Skin cream or ointment to calm the itching.  Compresses or medicated powders to dry the infected skin. HOME CARE INSTRUCTIONS   Be sure to treat the rash completely. Follow your caregiver's instructions. It can take a couple of weeks to treat. If you do not treat the infection long enough, the rash can come back.  Wear loose-fitting clothing.  Men should wear cotton boxer shorts.  Women should wear cotton underwear.  Avoid hot baths.  Dry the groin area well after bathing. SEEK MEDICAL CARE IF:   Your rash is worse.  Your rash is spreading.  Your rash returns after treatment is finished.  Your rash is not gone in 4 weeks. Fungal infections are slow to respond to treatment. Some redness may remain for several weeks after the fungus is gone. SEEK IMMEDIATE MEDICAL CARE  IF:  The area becomes red, warm, tender, and swollen.  You have a fever. Document Released: 06/20/2002 Document Revised: 09/22/2011 Document Reviewed: 05/19/2008 Exit[employer] Patient Information 2014 [employer].  

## 2013-10-02 LAB — COMPREHENSIVE METABOLIC PANEL
ALBUMIN: 4.5 g/dL (ref 3.5–5.2)
ALT: 18 U/L (ref 0–53)
AST: 19 U/L (ref 0–37)
Alkaline Phosphatase: 94 U/L (ref 39–117)
BUN: 10 mg/dL (ref 6–23)
CALCIUM: 10.3 mg/dL (ref 8.4–10.5)
CHLORIDE: 100 meq/L (ref 96–112)
CO2: 28 mEq/L (ref 19–32)
CREATININE: 0.85 mg/dL (ref 0.50–1.35)
Glucose, Bld: 58 mg/dL — ABNORMAL LOW (ref 70–99)
Potassium: 4.1 mEq/L (ref 3.5–5.3)
Sodium: 138 mEq/L (ref 135–145)
Total Bilirubin: 0.6 mg/dL (ref 0.2–1.2)
Total Protein: 7.9 g/dL (ref 6.0–8.3)

## 2013-10-02 LAB — TSH: TSH: 2.03 u[IU]/mL (ref 0.350–4.500)

## 2013-10-02 LAB — ACUTE HEP PANEL AND HEP B SURFACE AB
HCV Ab: NEGATIVE
HEP B S AB: NEGATIVE
HEP B S AG: NEGATIVE
Hep A IgM: NONREACTIVE
Hep B C IgM: NONREACTIVE

## 2013-10-03 LAB — PSA: PSA: 7.05 ng/mL — ABNORMAL HIGH (ref <=4.00)

## 2013-10-05 NOTE — Addendum Note (Signed)
Addended by: Carmelina DaneANDERSON, Leathie Weich S on: 10/05/2013 09:02 AM   Modules accepted: Orders

## 2013-10-10 ENCOUNTER — Telehealth: Payer: Self-pay | Admitting: Gastroenterology

## 2013-10-10 NOTE — Telephone Encounter (Signed)
Patient does not have a preference for new MD. He reports several years of burping and some GERD. It is now very severe. Reports he has to eat bland diet or he has terrible GERD and burping. Scheduled with Doug SouJessica Zehr, PA on 10/11/13 at 1:30 PM.

## 2013-10-11 ENCOUNTER — Encounter: Payer: Self-pay | Admitting: Gastroenterology

## 2013-10-11 ENCOUNTER — Ambulatory Visit (INDEPENDENT_AMBULATORY_CARE_PROVIDER_SITE_OTHER): Payer: Medicare Other | Admitting: Gastroenterology

## 2013-10-11 VITALS — BP 116/78 | HR 80 | Ht 70.0 in | Wt 183.0 lb

## 2013-10-11 DIAGNOSIS — R141 Gas pain: Secondary | ICD-10-CM

## 2013-10-11 DIAGNOSIS — R143 Flatulence: Secondary | ICD-10-CM

## 2013-10-11 DIAGNOSIS — K219 Gastro-esophageal reflux disease without esophagitis: Secondary | ICD-10-CM

## 2013-10-11 DIAGNOSIS — R142 Eructation: Secondary | ICD-10-CM

## 2013-10-11 MED ORDER — ESOMEPRAZOLE MAGNESIUM 40 MG PO CPDR: 40.0000 mg | DELAYED_RELEASE_CAPSULE | Freq: Every day | ORAL | Status: DC

## 2013-10-11 NOTE — Progress Notes (Addendum)
10/11/2013 Derek BoatmanFred A [name] [id number] 08/16/1945   HISTORY OF PRESENT ILLNESS:  This is a pleasant 68 year old male who was known to Dr. Jarold MottoPatterson previously for colonoscopy (says that it was 5 or 6 years ago and it was normal at that time, but we cannot access those records at this time).  He is being seen today for complaints of reflux and severe belching.  He describes the belching as "violent".  Says that he's had these issues for several years, but they have been worsening.  This is worse with any type of seasoned foods; if he eats a bland diet then it is much better for a while.  He is taking Nexium 20 mg alternating with zantac 150 mg (takes one for a while then switches to the other).  The symptoms are worse usually about 2 hours or so after eating and then last a couple of hours.  Denies any nausea, vomiting, weight loss, or abdominal pain.  No dysphagia.    Past Medical History  Diagnosis Date  . COPD (chronic obstructive pulmonary disease)   . Anxiety   . Arrhythmia   . Asthma   . Chronic headaches   . COPD (chronic obstructive pulmonary disease)   . Depression   . Diabetes   . Diverticulosis   . GERD (gastroesophageal reflux disease)   . Glaucoma   . Hyperlipidemia   . Hypertension   . IBS (irritable bowel syndrome)   . Pneumonia   . Sleep apnea    Past Surgical History  Procedure Laterality Date  . Rotator cuff repair  2008  . Lumbar spine surgery  2010    reports that he has never smoked. He has never used smokeless tobacco. He reports that he does not drink alcohol or use illicit drugs. family history includes Diabetes in an other family member. Allergies  Allergen Reactions  . Penicillins Rash      Outpatient Encounter Prescriptions as of 10/11/2013  Medication Sig  . aspirin 81 MG tablet Take 81 mg by mouth daily.  . bimatoprost (LUMIGAN) 0.03 % ophthalmic solution 1 drop at bedtime.  . cetirizine (ZYRTEC) 10 MG tablet Take 10 mg by mouth daily.  .  chlordiazePOXIDE (LIBRIUM) 10 MG capsule Take 10 mg by mouth daily.  . cyclobenzaprine (FLEXERIL) 5 MG tablet Take 5 mg by mouth at bedtime.  . diclofenac (VOLTAREN) 50 MG EC tablet Take 50 mg by mouth daily.  Marland Kitchen. dicyclomine (BENTYL) 10 MG capsule Use one before meals and at bedtime as needed for diarrhea or cramping  . esomeprazole (NEXIUM) 20 MG capsule Take 20 mg by mouth daily before breakfast.  . glucose blood (FREESTYLE LITE) test strip Use as instructed to check blood sugars 2 times per day dx code 250.00  . HYDROcodone-acetaminophen (NORCO/VICODIN) 5-325 MG per tablet Take 1 tablet by mouth every 6 (six) hours as needed for pain.  Marland Kitchen. irbesartan-hydrochlorothiazide (AVALIDE) 150-12.5 MG per tablet Take 1 tablet by mouth daily.  . metoprolol succinate (TOPROL-XL) 25 MG 24 hr tablet Take 25 mg by mouth daily.  . Miconazole Nitrate 2 % AERP Apply to groin BID  . potassium chloride (K-DUR,KLOR-CON) 10 MEQ tablet Take 10 mEq by mouth 2 (two) times daily.  . ranitidine (ZANTAC) 150 MG capsule Take 150 mg by mouth 2 (two) times daily.  . sertraline (ZOLOFT) 100 MG tablet Take 100 mg by mouth daily.  . simvastatin (ZOCOR) 40 MG tablet Take 40 mg by mouth every evening.  . sitaGLIPtin-metformin (  JANUMET) 50-1000 MG per tablet Take 1 tablet by mouth daily.  . tamsulosin (FLOMAX) 0.4 MG CAPS capsule Take 0.4 mg by mouth.  . TraZODone HCl 150 MG TB24 Take 150 mg by mouth.  . zolpidem (AMBIEN) 10 MG tablet Take 10 mg by mouth at bedtime as needed for sleep.     REVIEW OF SYSTEMS  : All other systems reviewed and negative except where noted in the History of Present Illness.   PHYSICAL EXAM: BP 116/78  Pulse 80  Ht 5\' 10"  (1.778 m)  Wt 183 lb (83.008 kg)  BMI 26.26 kg/m2 General: Well developed black male in no acute distress Head: Normocephalic and atraumatic Eyes:  Sclerae anicteric, conjunctiva pink. Ears: Normal auditory acuity. Lungs: Clear throughout to auscultation Heart: Regular  rate and rhythm Abdomen: Soft, non-distended.  Normal bowel sounds.  Non-tender. Musculoskeletal: Symmetrical with no gross deformities  Skin: No lesions on visible extremities Extremities: No edema  Neurological: Alert oriented x 4, grossly non-focal Psychological:  Alert and cooperative. Normal mood and affect  ASSESSMENT AND PLAN: -GERD/belching:  Present for years, but worse recently.  Will have him take Nexium 40 mg daily.  Also, we will give him the GERD dietary measures to follow.  Schedule EGD for further evaluation.  The risks, benefits, and alternatives were discussed with the patient and he consents to proceed.   Addendum: Reviewed and agree with initial management. Beverley Fiedler, MD

## 2013-10-11 NOTE — Patient Instructions (Signed)
You have been scheduled for an endoscopy with propofol. Please follow written instructions given to you at your visit today. If you use inhalers (even only as needed), please bring them with you on the day of your procedure. Your physician has requested that you go to www.startemmi.com and enter the access code given to you at your visit today. This web site gives a general overview about your procedure. However, you should still follow specific instructions given to you by our office regarding your preparation for the procedure.  Nexium 40 mg was sent to your pharmacy, please take one capsule by mouth once daily  _________________________________________________________________________________________  Diet for Gastroesophageal Reflux Disease, Adult Reflux (acid reflux) is when acid from your stomach flows up into the esophagus. When acid comes in contact with the esophagus, the acid causes irritation and soreness (inflammation) in the esophagus. When reflux happens often or so severely that it causes damage to the esophagus, it is called gastroesophageal reflux disease (GERD). Nutrition therapy can help ease the discomfort of GERD. FOODS OR DRINKS TO AVOID OR LIMIT  Smoking or chewing tobacco. Nicotine is one of the most potent stimulants to acid production in the gastrointestinal tract.  Caffeinated and decaffeinated coffee and black tea.  Regular or low-calorie carbonated beverages or energy drinks (caffeine-free carbonated beverages are allowed).   Strong spices, such as black pepper, white pepper, red pepper, cayenne, curry powder, and chili powder.  Peppermint or spearmint.  Chocolate.  High-fat foods, including meats and fried foods. Extra added fats including oils, butter, salad dressings, and nuts. Limit these to less than 8 tsp per day.  Fruits and vegetables if they are not tolerated, such as citrus fruits or tomatoes.  Alcohol.  Any food that seems to aggravate your  condition. If you have questions regarding your diet, call your caregiver or a registered dietitian. OTHER THINGS THAT MAY HELP GERD INCLUDE:   Eating your meals slowly, in a relaxed setting.  Eating 5 to 6 small meals per day instead of 3 large meals.  Eliminating food for a period of time if it causes distress.  Not lying down until 3 hours after eating a meal.  Keeping the head of your bed raised 6 to 9 inches (15 to 23 cm) by using a foam wedge or blocks under the legs of the bed. Lying flat may make symptoms worse.  Being physically active. Weight loss may be helpful in reducing reflux in overweight or obese adults.  Wear loose fitting clothing EXAMPLE MEAL PLAN This meal plan is approximately 2,000 calories based on https://www.[place].org/ChooseMyPlate.gov meal planning guidelines. Breakfast   cup cooked oatmeal.  1 cup strawberries.  1 cup low-fat milk.  1 oz almonds. Snack  1 cup cucumber slices.  6 oz yogurt (made from low-fat or fat-free milk). Lunch  2 slice whole-wheat bread.  2 oz sliced [place].  2 tsp mayonnaise.  1 cup blueberries.  1 cup snap peas. Snack  6 whole-wheat crackers.  1 oz string cheese. Dinner   cup brown rice.  1 cup mixed veggies.  1 tsp olive oil.  3 oz grilled fish. Document Released: 06/30/2005 Document Revised: 09/22/2011 Document Reviewed: 05/16/2011 [facility] Patient Information 2014 [place], [place].

## 2013-10-17 ENCOUNTER — Encounter: Payer: Medicare Other | Admitting: Internal Medicine

## 2013-10-27 ENCOUNTER — Encounter: Payer: Medicare Other | Admitting: Internal Medicine

## 2013-11-17 ENCOUNTER — Other Ambulatory Visit (INDEPENDENT_AMBULATORY_CARE_PROVIDER_SITE_OTHER): Payer: Medicare Other

## 2013-11-17 DIAGNOSIS — E119 Type 2 diabetes mellitus without complications: Secondary | ICD-10-CM

## 2013-11-17 DIAGNOSIS — E7800 Pure hypercholesterolemia, unspecified: Secondary | ICD-10-CM

## 2013-11-17 LAB — COMPREHENSIVE METABOLIC PANEL
ALK PHOS: 75 U/L (ref 39–117)
ALT: 14 U/L (ref 0–53)
AST: 21 U/L (ref 0–37)
Albumin: 4 g/dL (ref 3.5–5.2)
BUN: 12 mg/dL (ref 6–23)
CO2: 29 mEq/L (ref 19–32)
Calcium: 9.3 mg/dL (ref 8.4–10.5)
Chloride: 104 mEq/L (ref 96–112)
Creatinine, Ser: 1 mg/dL (ref 0.4–1.5)
GFR: 96.35 mL/min (ref >60.00)
GLUCOSE: 81 mg/dL (ref 70–99)
Potassium: 4 mEq/L (ref 3.5–5.1)
SODIUM: 139 meq/L (ref 135–145)
TOTAL PROTEIN: 7.3 g/dL (ref 6.0–8.3)
Total Bilirubin: 0.5 mg/dL (ref 0.2–1.2)

## 2013-11-17 LAB — LIPID PANEL
Cholesterol: 123 mg/dL (ref 0–200)
HDL: 37.2 mg/dL — ABNORMAL LOW (ref >39.00)
LDL CALC: 63 mg/dL (ref 0–99)
Total CHOL/HDL Ratio: 3
Triglycerides: 112 mg/dL (ref 0.0–149.0)
VLDL: 22.4 mg/dL (ref 0.0–40.0)

## 2013-11-17 LAB — HEMOGLOBIN A1C: HEMOGLOBIN A1C: 6.2 % (ref 4.6–6.5)

## 2013-11-23 ENCOUNTER — Ambulatory Visit: Payer: Medicare Other | Admitting: Endocrinology

## 2013-11-23 ENCOUNTER — Encounter: Payer: Medicare Other | Admitting: Internal Medicine

## 2013-12-30 ENCOUNTER — Ambulatory Visit (INDEPENDENT_AMBULATORY_CARE_PROVIDER_SITE_OTHER): Payer: Medicare Other | Admitting: Endocrinology

## 2013-12-30 ENCOUNTER — Encounter: Payer: Self-pay | Admitting: Endocrinology

## 2013-12-30 VITALS — BP 118/64 | HR 80 | Temp 98.0°F | Resp 16 | Ht 70.0 in | Wt 178.0 lb

## 2013-12-30 DIAGNOSIS — E119 Type 2 diabetes mellitus without complications: Secondary | ICD-10-CM

## 2013-12-30 DIAGNOSIS — I10 Essential (primary) hypertension: Secondary | ICD-10-CM

## 2013-12-30 DIAGNOSIS — E785 Hyperlipidemia, unspecified: Secondary | ICD-10-CM

## 2013-12-30 NOTE — Patient Instructions (Signed)
Janumet 50/500, 2 daily; the XR tab can be taken 1x daily

## 2013-12-30 NOTE — Progress Notes (Addendum)
Patient ID: Derek [name], male   DOB: 1945/10/26, 68 y.o.   MRN: [id number]  Reason for Appointment: Diabetes follow-up   History of Present Illness   Diagnosis: Type 2 DIABETES MELITUS, date of diagnosis: 2008      Past history: Although his glucose was 600 at diagnosis he was initially treated with metformin and subsequently had relatively milder diabetes Januvia was added to his regimen in 2011  He has had relatively mild diabetes which is consistently well controlled with regimen of Janumet  He is now complaining about the cost of Janumet and apparently is taking it only once a day Usually has upper normal A1c and this is excellent now He has checked blood sugar somewhat sporadically and has only a couple of readings Has has lost some of the previous weight that he had gained and is back to walking     Side effects from medications: None      Monitors blood glucose:  less than once a day.    Glucometer:  FreeStyle           Blood Glucose readings 131 in 115 in the afternoon/6 PM, only 2 readings recently  Hypoglycemia: Never.          Meals: 3 meals per day.  eating out less now          Physical activity: exercise: walking 3/7, 2 miles            Dietician visit: Most recent: Unknown         Complications: are: None    Wt Readings from Last 3 Encounters:  12/30/13 178 lb (80.74 kg)  10/11/13 183 lb (83.008 kg)  10/01/13 182 lb (82.555 kg)   Lab Results  Component Value Date   HGBA1C 6.2 11/17/2013   HGBA1C 6.4 07/19/2013   HGBA1C 6.5 03/08/2013   Lab Results  Component Value Date   MICROALBUR 0.5 03/08/2013   LDLCALC 63 11/17/2013   CREATININE 1.0 11/17/2013    No visits with results within 1 Week(s) from this visit. Latest known visit with results is:  Appointment on 11/17/2013  Component Date Value Ref Range Status  . Hemoglobin A1C 11/17/2013 6.2  4.6 - 6.5 % Final   Glycemic Control Guidelines for People with Diabetes:Non Diabetic:  <6%Goal of Therapy: <7%Additional  Action Suggested:  >8%   . Sodium 11/17/2013 139  135 - 145 mEq/L Final  . Potassium 11/17/2013 4.0  3.5 - 5.1 mEq/L Final  . Chloride 11/17/2013 104  96 - 112 mEq/L Final  . CO2 11/17/2013 29  19 - 32 mEq/L Final  . Glucose, Bld 11/17/2013 81  70 - 99 mg/dL Final  . BUN 95/62/1308 12  6 - 23 mg/dL Final  . Creatinine, Ser 11/17/2013 1.0  0.4 - 1.5 mg/dL Final  . Total Bilirubin 11/17/2013 0.5  0.2 - 1.2 mg/dL Final  . Alkaline Phosphatase 11/17/2013 75  39 - 117 U/L Final  . AST 11/17/2013 21  0 - 37 U/L Final  . ALT 11/17/2013 14  0 - 53 U/L Final  . Total Protein 11/17/2013 7.3  6.0 - 8.3 g/dL Final  . Albumin 65/78/4696 4.0  3.5 - 5.2 g/dL Final  . Calcium 29/52/8413 9.3  8.4 - 10.5 mg/dL Final  . GFR 24/40/1027 96.35  >60.00 mL/min Final  . Cholesterol 11/17/2013 123  0 - 200 mg/dL Final   ATP III Classification       Desirable:  < 200 mg/dL  Borderline High:  200 - 239 mg/dL          High:  > = 098240 mg/dL  . Triglycerides 11/17/2013 112.0  0.0 - 149.0 mg/dL Final   Normal:  <119<150 mg/dLBorderline High:  150 - 199 mg/dL  . HDL 11/17/2013 37.20* >39.00 mg/dL Final  . VLDL 14/78/295605/01/2014 22.4  0.0 - 40.0 mg/dL Final  . LDL Cholesterol 11/17/2013 63  0 - 99 mg/dL Final  . Total CHOL/HDL Ratio 11/17/2013 3   Final                  Men          Women1/2 Average Risk     3.4          3.3Average Risk          5.0          4.42X Average Risk          9.6          7.13X Average Risk          15.0          11.0                          Medication List       This list is accurate as of: 12/30/13  3:39 PM.  Always use your most recent med list.               aspirin 81 MG tablet  Take 81 mg by mouth daily.     bimatoprost 0.03 % ophthalmic solution  Commonly known as:  LUMIGAN  1 drop at bedtime.     cetirizine 10 MG tablet  Commonly known as:  ZYRTEC  Take 10 mg by mouth daily.     chlordiazePOXIDE 10 MG capsule  Commonly known as:  LIBRIUM  Take 10 mg by mouth daily.      cyclobenzaprine 5 MG tablet  Commonly known as:  FLEXERIL  Take 5 mg by mouth at bedtime.     diclofenac 50 MG EC tablet  Commonly known as:  VOLTAREN  Take 50 mg by mouth daily.     dicyclomine 10 MG capsule  Commonly known as:  BENTYL  Use one before meals and at bedtime as needed for diarrhea or cramping     esomeprazole 40 MG capsule  Commonly known as:  NEXIUM  Take 1 capsule (40 mg total) by mouth daily.     glucose blood test strip  Commonly known as:  FREESTYLE LITE  Use as instructed to check blood sugars 2 times per day dx code 250.00     HYDROcodone-acetaminophen 5-325 MG per tablet  Commonly known as:  NORCO/VICODIN  Take 1 tablet by mouth every 6 (six) hours as needed for pain.     irbesartan-hydrochlorothiazide 150-12.5 MG per tablet  Commonly known as:  AVALIDE  Take 1 tablet by mouth daily.     metoprolol succinate 25 MG 24 hr tablet  Commonly known as:  TOPROL-XL  Take 25 mg by mouth daily.     Miconazole Nitrate 2 % Aerp  Apply to groin BID     potassium chloride 10 MEQ tablet  Commonly known as:  K-DUR,KLOR-CON  Take 10 mEq by mouth 2 (two) times daily.     ranitidine 150 MG capsule  Commonly known as:  ZANTAC  Take 150 mg by mouth 2 (two) times daily.  sertraline 100 MG tablet  Commonly known as:  ZOLOFT  Take 100 mg by mouth daily.     simvastatin 40 MG tablet  Commonly known as:  ZOCOR  Take 40 mg by mouth every evening.     sitaGLIPtin-metformin 50-1000 MG per tablet  Commonly known as:  JANUMET  Take 1 tablet by mouth daily.     tamsulosin 0.4 MG Caps capsule  Commonly known as:  FLOMAX  Take 0.4 mg by mouth.     TraZODone HCl 150 MG Tb24  Take 150 mg by mouth.     zolpidem 10 MG tablet  Commonly known as:  AMBIEN  Take 10 mg by mouth at bedtime as needed for sleep.        Allergies:  Allergies  Allergen Reactions  . Penicillins Rash    Past Medical History  Diagnosis Date  . COPD (chronic obstructive  pulmonary disease)   . Anxiety   . Arrhythmia   . Asthma   . Chronic headaches   . COPD (chronic obstructive pulmonary disease)   . Depression   . Diabetes   . Diverticulosis   . GERD (gastroesophageal reflux disease)   . Glaucoma   . Hyperlipidemia   . Hypertension   . IBS (irritable bowel syndrome)   . Pneumonia   . Sleep apnea     Past Surgical History  Procedure Laterality Date  . Rotator cuff repair  2008  . Lumbar spine surgery  2010    Family History  Problem Relation Age of Onset  . Diabetes      Social History:  reports that he has never smoked. He has never used smokeless tobacco. He reports that he does not drink alcohol or use illicit drugs.  Review of Systems:  HYPERTENSION:  appears well controlled with Avalide which he has taken for years   On Toprol for relatively fast heart rate  HYPERLIPIDEMIA: The lipid abnormality consists of elevated LDL and low HDL treated with simvastatin.  Lab Results  Component Value Date   CHOL 123 11/17/2013   HDL 37.20* 11/17/2013   LDLCALC 63 11/17/2013   TRIG 112.0 11/17/2013   CHOLHDL 3 11/17/2013     Numbness in feet: Mild    Examination:   BP 118/64  Pulse 80  Temp(Src) 98 F (36.7 C)  Resp 16  Ht 5\' 10"  (1.778 m)  Wt 178 lb (80.74 kg)  BMI 25.54 kg/m2  SpO2 97%  Body mass index is 25.54 kg/(m^2).   ASSESSMENT/ PLAN:   Diabetes type 2   The patient's diabetes control appears to be  well controlled with near-normal blood sugars  and upper normal in A1c  On this visit he has done better with his diet and exercise regimen and weight is coming down He is taking relatively low dose of Januvia and metformin He is asking for a lower cost alternative and since [facility] he can take 2 of these daily Discussed again blood sugar targets and to check periodically after meals also  Hypertension: Well controlled  HYPERCHOLESTEROLEMIA: LDL is at target but HDL  needs improvement    Pierre Dellarocco 12/30/2013, 3:39 PM

## 2014-01-11 ENCOUNTER — Telehealth: Payer: Self-pay | Admitting: Endocrinology

## 2014-01-11 NOTE — Telephone Encounter (Signed)
Patient needs help with his meter  He states there is something PC on it and needs help taking it off  Thank you

## 2014-03-14 LAB — HM DIABETES EYE EXAM

## 2014-03-15 ENCOUNTER — Encounter: Payer: Self-pay | Admitting: *Deleted

## 2014-03-28 ENCOUNTER — Other Ambulatory Visit: Payer: Self-pay | Admitting: *Deleted

## 2014-03-28 MED ORDER — SITAGLIPTIN PHOS-METFORMIN HCL 50-1000 MG PO TABS: 1.0000 | ORAL_TABLET | Freq: Every day | ORAL | Status: DC

## 2014-05-12 ENCOUNTER — Ambulatory Visit (INDEPENDENT_AMBULATORY_CARE_PROVIDER_SITE_OTHER): Payer: Medicare Other | Admitting: Cardiovascular Disease

## 2014-05-12 ENCOUNTER — Encounter: Payer: Self-pay | Admitting: Cardiovascular Disease

## 2014-05-12 ENCOUNTER — Telehealth: Payer: Self-pay | Admitting: *Deleted

## 2014-05-12 VITALS — BP 120/84 | HR 71 | Resp 16 | Ht 70.0 in | Wt 173.4 lb

## 2014-05-12 DIAGNOSIS — E119 Type 2 diabetes mellitus without complications: Secondary | ICD-10-CM

## 2014-05-12 DIAGNOSIS — I38 Endocarditis, valve unspecified: Secondary | ICD-10-CM | POA: Insufficient documentation

## 2014-05-12 DIAGNOSIS — I080 Rheumatic disorders of both mitral and aortic valves: Secondary | ICD-10-CM

## 2014-05-12 DIAGNOSIS — E785 Hyperlipidemia, unspecified: Secondary | ICD-10-CM

## 2014-05-12 DIAGNOSIS — I10 Essential (primary) hypertension: Secondary | ICD-10-CM

## 2014-05-12 NOTE — Progress Notes (Signed)
Patient ID: Derek [name], male   DOB: 1946-01-19, 68 y.o.   MRN: [id number]      Reason for office visit Hypertension, leaky heart valve, abnormal stress test  Derek [name] is a 68 year old gentleman with several cardiac risk factors (hypertension, type 2 diabetes mellitus, hyperlipidemia) which all appear to be well addressed with pharmacological therapy and lifestyle. He recently had an echocardiogram and a stress test in Dr. Merrilee Jansky office. Unfortunately those results are not available. He was told that his stress test was "not too bad" and that his echocardiogram showed leaky heart valve. It was recommended that he continue taking the same medications. Further workup was not recommended.  Derek [name] requests another evaluation. I think he may simply not have found enough reassurance that there was no evidence of significant heart disease.  He is physically active. He walks 3 days a week usually 2 miles in 45 minutes. He is a retired [employer]. He does not feel limited by shortness of breath or chest discomfort when walking. He does have occasional acid reflux which is clearly associated with meals and he never has "heartburn" during physical activity. He denies edema, intermittent claudication, palpitations, syncope or focal neurological deficits.   His father had a heart attack at age 40 but lived till he was 69 years old. There is a very strong family pattern of diabetes and high blood pressure in all 4 of his siblings, 3 of which still survive. One of his children had a congenital heart abnormality and died at age 68. His other 2 children are in good health. His mother is alive at age 49 and has high blood pressure.   Allergies  Allergen Reactions  . Penicillins Rash    Current Outpatient Prescriptions  Medication Sig Dispense Refill  . aspirin 81 MG tablet Take 81 mg by mouth daily.      . bimatoprost (LUMIGAN) 0.03 % ophthalmic solution 1 drop at bedtime.      . cetirizine (ZYRTEC)  10 MG tablet Take 10 mg by mouth daily.      . chlordiazePOXIDE (LIBRIUM) 10 MG capsule Take 10 mg by mouth daily.      Marland Kitchen esomeprazole (NEXIUM) 40 MG capsule Take 1 capsule (40 mg total) by mouth daily.  30 capsule  3  . glucose blood (FREESTYLE LITE) test strip Use as instructed to check blood sugars 2 times per day dx code 250.00  100 each  5  . HYDROcodone-acetaminophen (NORCO/VICODIN) 5-325 MG per tablet Take 1 tablet by mouth every 6 (six) hours as needed for pain.      Marland Kitchen irbesartan-hydrochlorothiazide (AVALIDE) 150-12.5 MG per tablet Take 1 tablet by mouth daily.      . metoprolol succinate (TOPROL-XL) 25 MG 24 hr tablet Take 25 mg by mouth daily.      . Miconazole Nitrate 2 % AERP Apply to groin BID  85 g  1  . potassium chloride (K-DUR,KLOR-CON) 10 MEQ tablet Take 10 mEq by mouth 2 (two) times daily.      . ranitidine (ZANTAC) 150 MG capsule Take 150 mg by mouth 2 (two) times daily.      . sertraline (ZOLOFT) 100 MG tablet Take 100 mg by mouth daily.      . simvastatin (ZOCOR) 40 MG tablet Take 40 mg by mouth every evening.      . sitaGLIPtin-metformin (JANUMET) 50-1000 MG per tablet Take 1 tablet by mouth daily.  30 tablet  3  . tamsulosin (FLOMAX) 0.4  MG CAPS capsule Take 0.4 mg by mouth.      . zolpidem (AMBIEN) 10 MG tablet Take 10 mg by mouth at bedtime as needed for sleep.       No current facility-administered medications for this visit.    Past Medical History  Diagnosis Date  . COPD (chronic obstructive pulmonary disease)   . Anxiety   . Arrhythmia   . Asthma   . Chronic headaches   . COPD (chronic obstructive pulmonary disease)   . Depression   . Diabetes   . Diverticulosis   . GERD (gastroesophageal reflux disease)   . Glaucoma   . Hyperlipidemia   . Hypertension   . IBS (irritable bowel syndrome)   . Pneumonia   . Sleep apnea     Past Surgical History  Procedure Laterality Date  . Rotator cuff repair  2008  . Lumbar spine surgery  2010    Family  History  Problem Relation Age of Onset  . Diabetes      History   Social History  . Marital Status: Married    Spouse Name: N/A    Number of Children: 2  . Years of Education: N/A   Occupational History  . Retired    Social History Main Topics  . Smoking status: Never Smoker   . Smokeless tobacco: Never Used  . Alcohol Use: No  . Drug Use: No  . Sexual Activity: Not on file   Other Topics Concern  . Not on file   Social History Narrative  . No narrative on file    Review of systems: The patient specifically denies any chest pain at rest or with exertion, dyspnea at rest or with exertion, orthopnea, paroxysmal nocturnal dyspnea, syncope, palpitations, focal neurological deficits, intermittent claudication, lower extremity edema, unexplained weight gain, cough, hemoptysis or wheezing.  The patient also denies abdominal pain, nausea, vomiting, dysphagia, diarrhea, constipation, polyuria, polydipsia, dysuria, hematuria, frequency, urgency, abnormal bleeding or bruising, fever, chills, unexpected weight changes, mood swings, change in skin or hair texture, change in voice quality, auditory or visual problems, allergic reactions or rashes, new musculoskeletal complaints other than usual "aches and pains".   PHYSICAL EXAM BP 120/84  Pulse 71  Resp 16  Ht $R'5\' 10"'dZ$  (1.778 m)  Wt 173 lb 6.4 oz (78.654 kg)  BMI 24.88 kg/m2  General: Alert, oriented x3, no distress Head: no evidence of trauma, PERRL, EOMI, no exophtalmos or lid lag, no myxedema, no xanthelasma; normal ears, nose and oropharynx Neck: normal jugular venous pulsations and no hepatojugular reflux; brisk carotid pulses without delay and no carotid bruits Chest: clear to auscultation, no signs of consolidation by percussion or palpation, normal fremitus, symmetrical and full respiratory excursions Cardiovascular: normal position and quality of the apical impulse, regular rhythm, normal first and second heart sounds, no  murmurs, rubs or gallops. He seems to have a very faint systolic click that changes slightly with the Valsalva maneuver. I cannot induce a murmur with any provocative maneuvers. Abdomen: no tenderness or distention, no masses by palpation, no abnormal pulsatility or arterial bruits, normal bowel sounds, no hepatosplenomegaly Extremities: no clubbing, cyanosis or edema; 2+ radial, ulnar and brachial pulses bilaterally; 2+ right femoral, posterior tibial and dorsalis pedis pulses; 2+ left femoral, posterior tibial and dorsalis pedis pulses; no subclavian or femoral bruits Neurological: grossly nonfocal   EKG: Normal sinus rhythm, normal tracing  Lipid Panel     Component Value Date/Time   CHOL 123 11/17/2013 1325   TRIG  112.0 11/17/2013 1325   HDL 37.20* 11/17/2013 1325   CHOLHDL 3 11/17/2013 1325   VLDL 22.4 11/17/2013 1325   LDLCALC 63 11/17/2013 1325   hemoglobin A1c consistently between 6.2 and 6.5  BMET    Component Value Date/Time   NA 139 11/17/2013 1325   K 4.0 11/17/2013 1325   CL 104 11/17/2013 1325   CO2 29 11/17/2013 1325   GLUCOSE 81 11/17/2013 1325   BUN 12 11/17/2013 1325   CREATININE 1.0 11/17/2013 1325   CREATININE 0.85 10/01/2013 1406   CALCIUM 9.3 11/17/2013 1325   GFRNONAA >60 04/27/2009 1523   GFRAA  Value: >60        The eGFR has been calculated using the MDRD equation. This calculation has not been validated in all clinical situations. eGFR's persistently <60 mL/min signify possible Chronic Kidney Disease. 04/27/2009 1523     ASSESSMENT AND PLAN  Mr. Hence has well-controlled type 2 diabetes mellitus, hypertension and hyperlipidemia. He has a healthy diet and exercises regularly and is on appropriate medications. He does not have symptoms that suggest active cardiac problems. His physical exam does not suggest that he has significant valve regurgitation. He has subtle findings that suggest possible mitral valve prolapse without significant insufficiency.  Unfortunately Dr. Merrilee Jansky  office is not open yet today and I cannot retrieve the results of his echocardiogram and stress test. My suspicion will be that the studies showed normal results or at most mild abnormalities, but there simply was a gap in communication. Once I have a chance to review those records I will give Mr. Ambrosia a call and tell him my opinion. I suspect that he does not require additional cardiac workup and he can return to the excellent care of his primary care physician.   Holli Humbles, MD, [place] [phone] office [phone] pager

## 2014-05-12 NOTE — Telephone Encounter (Signed)
[facility] results of echo and stress test faxed from Dr. Algie CofferKadakia- per Dr. Salena Saner tests are basically normal.  No need for follow-up unless he has any problems.

## 2014-05-12 NOTE — Patient Instructions (Signed)
We will call you once we receive your test results from Dr. Algie CofferKadakia.  Dr. Royann Shiversroitoru recommends that you schedule a follow-up appointment in: AS NEEDED.

## 2014-05-17 ENCOUNTER — Telehealth: Payer: Self-pay | Admitting: Cardiovascular Disease

## 2014-05-17 NOTE — Telephone Encounter (Signed)
Returning your call. If no answer please call him [phone]

## 2014-05-17 NOTE — Telephone Encounter (Signed)
Returning your call from last week. If he is not there,please [phone]

## 2014-05-18 NOTE — Telephone Encounter (Signed)
Informed echo and nuc results faxed from Dr. Algie CofferKadakia were basically normal per Dr. Salena Saner and patient does not need to schedule a return appt unless he has any problems.  Patient voiced understanding.

## 2014-06-07 ENCOUNTER — Encounter: Payer: Self-pay | Admitting: Internal Medicine

## 2014-06-07 ENCOUNTER — Ambulatory Visit (INDEPENDENT_AMBULATORY_CARE_PROVIDER_SITE_OTHER): Payer: Medicare Other | Admitting: Internal Medicine

## 2014-06-07 VITALS — BP 122/70 | HR 72 | Ht 70.0 in | Wt 175.8 lb

## 2014-06-07 DIAGNOSIS — K5900 Constipation, unspecified: Secondary | ICD-10-CM

## 2014-06-07 DIAGNOSIS — K219 Gastro-esophageal reflux disease without esophagitis: Secondary | ICD-10-CM

## 2014-06-07 DIAGNOSIS — R140 Abdominal distension (gaseous): Secondary | ICD-10-CM

## 2014-06-07 DIAGNOSIS — K5909 Other constipation: Secondary | ICD-10-CM

## 2014-06-07 MED ORDER — LINACLOTIDE 145 MCG PO CAPS: 145.0000 ug | ORAL_CAPSULE | Freq: Every day | ORAL | Status: DC

## 2014-06-07 NOTE — Patient Instructions (Addendum)
You have been scheduled for an endoscopy. Please follow written instructions given to you at your visit today. If you use inhalers (even only as needed), please bring them with you on the day of your procedure. Your physician has requested that you go to www.startemmi.com and enter the access code given to you at your visit today. This web site gives a general overview about your procedure. However, you should still follow specific instructions given to you by our office regarding your preparation for the procedure. We have sent the following medications to your pharmacy for you to pick up at your convenience: Linzess and given you a savings card. CC:  Corine ShelterGeorge Kilpatrick MD

## 2014-06-07 NOTE — Progress Notes (Signed)
Subjective:    Patient ID: Derek [name], male    DOB: 06-Jun-1946, 68 y.o.   MRN: [id number]  HPI Derek [name] is a 68 year old Derek [name] with past medical history of GERD, peptic esophageal stricture, hiatal hernia, gastritis, diverticulosis, COPD, hypertension, hyperlipidemia who is seen in follow-up. He was most recently seen in March 2015 by Doug SouJessica Zehr, PA-C to evaluate GERD and eructation. Upper endoscopy is recommended and scheduled and canceled 3 times by the patient. He returns today for follow-up.  His biggest complaint today is eructation. This seems to be somewhat cyclical. He reports he will have several days of belching and this is almost always improved when he has a bowel movement. He uses laxatives in the form of Ex-Lax about twice a week to induce bowel movement. Occasionally he will use enema. Without laxatives he has a bowel movement less than once per week. He denies heartburn when taking Nexium. He uses Nexium 40 mg daily. He denies dysphagia or odynophagia. When he is belching frequently he does report upper abdominal bloating and he trustees of bland soft food diet during this period.  Again he does not have dysphagia. Not using Zantac for breakthrough heartburn as he is not having heartburn recently. He denies blood in his stool or melena. Denies weight loss. Denies hepatobiliary complaint   Review of Systems As per history of present illness, otherwise negative  Current Medications, Allergies, Past Medical History, Past Surgical History, Family History and Social History were reviewed in [facility] electronic medical record.     Objective:   Physical Exam BP 122/70 mmHg  Pulse 72  Ht 5\' 10"  (1.778 m)  Wt 175 lb 12.8 oz (79.742 kg)  BMI 25.22 kg/m2 Constitutional: Well-developed and well-nourished. No distress. HEENT: Normocephalic and atraumatic. Oropharynx is clear and moist. No oropharyngeal exudate. Conjunctivae are normal.  No scleral icterus. Neck: Neck supple.  Trachea midline. Cardiovascular: Normal rate, regular rhythm and intact distal pulses. No M/R/G Pulmonary/chest: Effort normal and breath sounds normal. No wheezing, rales or rhonchi. Abdominal: Soft, nontender, nondistended. Bowel sounds active throughout Extremities: no clubbing, cyanosis, or edema Lymphadenopathy: No cervical adenopathy noted. Neurological: Alert and oriented to person place and time. Skin: Skin is warm and dry. No rashes noted. Psychiatric: Normal mood and affect. Behavior is normal.  EGD 11/19/2005 Dr. Jarold MottoPatterson -- esophageal stricture at GE junction dilated with [facility] dilator [address] [place]. Stricture biopsied to rule out Barrett's. 4 cm hiatal hernia. Antral erosive gastritis. Biopsied. Pathology = reflux esophagitis. No intestinal metaplasia seen. Colonoscopy 11/19/2005 -- exam to the cecum, fair prep. Diverticulosis from the cecum to the sigmoid colon. No polyps seen.  Pathology records indicate endoscopy from 04/30/2005 -- small bowel biopsies benign, no active inflammation or villous atrophy identified.      Assessment & Plan:   68 year old male with past medical history of GERD, peptic esophageal stricture, hiatal hernia, gastritis, diverticulosis, COPD, hypertension, hyperlipidemia who is seen in follow-up. He was most recently seen in March 2015 by Doug SouJessica Zehr, PA-C to evaluate GERD and eructation.   1. Eructation/hx of GERD -- given his persistent eructation and history of GERD I would like to perform repeat endoscopy to exclude gastric outlet obstruction which would cause increased belching and gastric distention. Endoscopy will also allow for the exclusion of Barrett's esophagus giving long-standing GERD. He does not have dysphagia, though in the past as needed dilation of a GE junction stricture. Is also possible that some of his belching and eructation or secondary to  constipation as symptoms seem to improve when he uses laxatives. See #2.   He will continue  Nexium 40 mg daily.  2. Constipation -- long-standing. Trial of Linzess 145 g daily. This may help some of his symptoms as described in #1  3. CRC screening -- repeat colonoscopy recommended May 2017

## 2014-06-12 ENCOUNTER — Encounter: Payer: Self-pay | Admitting: Cardiovascular Disease

## 2014-06-13 ENCOUNTER — Encounter: Payer: Self-pay | Admitting: Cardiovascular Disease

## 2014-06-15 ENCOUNTER — Encounter: Payer: Self-pay | Admitting: Cardiovascular Disease

## 2014-06-22 ENCOUNTER — Encounter: Payer: Self-pay | Admitting: Internal Medicine

## 2014-06-28 ENCOUNTER — Other Ambulatory Visit: Payer: Medicare Other

## 2014-07-03 ENCOUNTER — Ambulatory Visit: Payer: Medicare Other | Admitting: Endocrinology

## 2014-07-27 ENCOUNTER — Other Ambulatory Visit: Payer: Self-pay | Admitting: *Deleted

## 2014-07-27 MED ORDER — SITAGLIPTIN PHOS-METFORMIN HCL 50-1000 MG PO TABS: 1.0000 | ORAL_TABLET | Freq: Every day | ORAL | Status: DC

## 2014-07-28 ENCOUNTER — Encounter: Payer: Self-pay | Admitting: Internal Medicine

## 2014-07-28 ENCOUNTER — Ambulatory Visit (AMBULATORY_SURGERY_CENTER): Payer: Medicare Other | Admitting: Internal Medicine

## 2014-07-28 VITALS — BP 111/69 | HR 65 | Temp 97.0°F | Resp 21 | Ht 70.0 in | Wt 173.0 lb

## 2014-07-28 DIAGNOSIS — K219 Gastro-esophageal reflux disease without esophagitis: Secondary | ICD-10-CM

## 2014-07-28 DIAGNOSIS — R140 Abdominal distension (gaseous): Secondary | ICD-10-CM

## 2014-07-28 DIAGNOSIS — R142 Eructation: Secondary | ICD-10-CM

## 2014-07-28 LAB — GLUCOSE, CAPILLARY
GLUCOSE-CAPILLARY: 105 mg/dL — AB (ref 70–99)
Glucose-Capillary: 75 mg/dL (ref 70–99)
Glucose-Capillary: 84 mg/dL (ref 70–99)

## 2014-07-28 MED ORDER — SODIUM CHLORIDE 0.9 % IV SOLN: 500.0000 mL | INTRAVENOUS | Status: DC

## 2014-07-28 MED ORDER — DEXTROSE 5 % IV SOLN: INTRAVENOUS | Status: DC

## 2014-07-28 NOTE — Op Note (Signed)
[facility] [address] [place], [id number]   ENDOSCOPY PROCEDURE REPORT  PATIENT: Derek [name], Derek [name]  MR#: [id number] BIRTHDATE: 12-23-1945 , 68  yrs. old GENDER: male ENDOSCOPIST: Beverley FiedlerJay M Dareth Andrew, MD PROCEDURE DATE:  07/28/2014 PROCEDURE:  EGD, diagnostic ASA CLASS:     Class II INDICATIONS:  history of GERD and eructation and bloating. MEDICATIONS: Monitored anesthesia care and Propofol 140 mg IV TOPICAL ANESTHETIC: none  DESCRIPTION OF PROCEDURE: After the risks benefits and alternatives of the procedure were thoroughly explained, informed consent was obtained.  The LB [id number] endoscope was introduced through the mouth and advanced to the second portion of the duodenum , Without limitations.  The instrument was slowly withdrawn as the mucosa was fully examined.      EXAM: The esophagus and gastroesophageal junction were completely normal in appearance.  The stomach was entered and closely examined.The antrum, angularis, and lesser curvature were well visualized, including [name] retroflexed view of the cardia and fundus. The stomach wall was normally distensible.  The scope passed easily through the pylorus into the duodenum.  Retroflexed views revealed no abnormalities.     The scope was then withdrawn from the patient and the procedure completed.  COMPLICATIONS: There were no immediate complications.  ENDOSCOPIC IMPRESSION: Normal upper endoscopy  RECOMMENDATIONS: Continue current meds  eSigned:  Beverley FiedlerJay M Ilia Engelbert, MD 07/28/2014 2:34 PM    CC:The Patient and Corine ShelterKilpatrick, George MD

## 2014-07-28 NOTE — Progress Notes (Signed)
Procedure ends, to recovery, report given and VSS. 

## 2014-07-28 NOTE — Patient Instructions (Signed)

## 2014-07-31 ENCOUNTER — Telehealth: Payer: Self-pay | Admitting: *Deleted

## 2014-07-31 NOTE — Telephone Encounter (Signed)
  Follow up Call-  Call back number 07/28/2014  Post procedure Call Back phone  # [phone]  Permission to leave phone message Yes    [facility]

## 2014-08-14 ENCOUNTER — Other Ambulatory Visit (INDEPENDENT_AMBULATORY_CARE_PROVIDER_SITE_OTHER): Payer: Medicare Other

## 2014-08-14 DIAGNOSIS — E785 Hyperlipidemia, unspecified: Secondary | ICD-10-CM

## 2014-08-14 DIAGNOSIS — E119 Type 2 diabetes mellitus without complications: Secondary | ICD-10-CM

## 2014-08-14 LAB — COMPREHENSIVE METABOLIC PANEL
ALT: 12 U/L (ref 0–53)
AST: 14 U/L (ref 0–37)
Albumin: 4.1 g/dL (ref 3.5–5.2)
Alkaline Phosphatase: 87 U/L (ref 39–117)
BUN: 9 mg/dL (ref 6–23)
CO2: 28 meq/L (ref 19–32)
Calcium: 9.4 mg/dL (ref 8.4–10.5)
Chloride: 105 mEq/L (ref 96–112)
Creatinine, Ser: 0.79 mg/dL (ref 0.40–1.50)
GFR: 124.74 mL/min (ref >60.00)
GLUCOSE: 73 mg/dL (ref 70–99)
Potassium: 3.7 mEq/L (ref 3.5–5.1)
SODIUM: 140 meq/L (ref 135–145)
TOTAL PROTEIN: 7.1 g/dL (ref 6.0–8.3)
Total Bilirubin: 0.3 mg/dL (ref 0.2–1.2)

## 2014-08-14 LAB — LIPID PANEL
CHOL/HDL RATIO: 3
Cholesterol: 133 mg/dL (ref 0–200)
HDL: 40.4 mg/dL (ref >39.00)
LDL Cholesterol: 53 mg/dL (ref 0–99)
NONHDL: 92.6
TRIGLYCERIDES: 196 mg/dL — AB (ref 0.0–149.0)
VLDL: 39.2 mg/dL (ref 0.0–40.0)

## 2014-08-14 LAB — MICROALBUMIN / CREATININE URINE RATIO
Creatinine,U: 135.6 mg/dL
MICROALB/CREAT RATIO: 0.8 mg/g (ref 0.0–30.0)
Microalb, Ur: 1.1 mg/dL (ref 0.0–1.9)

## 2014-08-14 LAB — HEMOGLOBIN A1C: HEMOGLOBIN A1C: 6.2 % (ref 4.6–6.5)

## 2014-08-17 ENCOUNTER — Ambulatory Visit: Payer: Medicare Other | Admitting: Endocrinology

## 2014-08-17 ENCOUNTER — Ambulatory Visit (INDEPENDENT_AMBULATORY_CARE_PROVIDER_SITE_OTHER): Payer: Medicare Other | Admitting: Endocrinology

## 2014-08-17 ENCOUNTER — Encounter: Payer: Self-pay | Admitting: Endocrinology

## 2014-08-17 VITALS — BP 132/72 | HR 70 | Temp 98.0°F | Resp 14 | Ht 70.0 in | Wt 174.2 lb

## 2014-08-17 DIAGNOSIS — E782 Mixed hyperlipidemia: Secondary | ICD-10-CM

## 2014-08-17 DIAGNOSIS — E119 Type 2 diabetes mellitus without complications: Secondary | ICD-10-CM

## 2014-08-17 NOTE — Patient Instructions (Signed)
Please check blood sugars at least half the time about 2 hours after any meal and 1 times per week on waking up. Please bring blood sugar monitor to each visit. Recommended blood sugar levels about 2 hours after meal is 140-180 and on waking up 90-130

## 2014-08-17 NOTE — Progress Notes (Signed)
Patient ID: Derek [name], male   DOB: 05-Mar-1946, 69 y.o.   MRN: [id number]           Reason for Appointment: Diabetes follow-up   History of Present Illness   Diagnosis: Type 2 DIABETES MELITUS, date of diagnosis: 2008      Past history: Although his glucose was 600 at diagnosis he was initially treated with metformin and subsequently had relatively milder diabetes Januvia was added to his regimen in 2011  He has had relatively mild diabetes which is consistently well controlled with regimen of Janumet  Usually has upper normal A1c and this is unchanged now  He has checked blood sugar only very infrequently and has only one reading on his monitor  He is still fairly good with his walking program and has maintained his weight. He thinks he is generally watching his diet also      Side effects from medications: None      Monitors blood glucose:  less than once a day.    Glucometer:  FreeStyle           Blood Glucose readings: has only one reading of 100   Hypoglycemia: Never.          Meals: 3 meals per day.  eating out less         Physical activity: exercise: walking 3/7 days, 2 miles  usually            Dietician visit: Most recent: Unknown         Complications: are: None    Wt Readings from Last 3 Encounters:  08/17/14 174 lb 3.2 oz (79.017 kg)  07/28/14 173 lb (78.472 kg)  06/07/14 175 lb 12.8 oz (79.742 kg)   Lab Results  Component Value Date   HGBA1C 6.2 08/14/2014   HGBA1C 6.2 11/17/2013   HGBA1C 6.4 07/19/2013   Lab Results  Component Value Date   MICROALBUR 1.1 08/14/2014   LDLCALC 53 08/14/2014   CREATININE 0.79 08/14/2014    Appointment on 08/14/2014  Component Date Value Ref Range Status  . Hgb A1c MFr Bld 08/14/2014 6.2  4.6 - 6.5 % Final   Glycemic Control Guidelines for People with Diabetes:Non Diabetic:  <6%Goal of Therapy: <7%Additional Action Suggested:  >8%   . Sodium 08/14/2014 140  135 - 145 mEq/L Final  . Potassium 08/14/2014 3.7  3.5 - 5.1  mEq/L Final  . Chloride 08/14/2014 105  96 - 112 mEq/L Final  . CO2 08/14/2014 28  19 - 32 mEq/L Final  . Glucose, Bld 08/14/2014 73  70 - 99 mg/dL Final  . BUN 40/98/1191 9  6 - 23 mg/dL Final  . Creatinine, Ser 08/14/2014 0.79  0.40 - 1.50 mg/dL Final  . Total Bilirubin 08/14/2014 0.3  0.2 - 1.2 mg/dL Final  . Alkaline Phosphatase 08/14/2014 87  39 - 117 U/L Final  . AST 08/14/2014 14  0 - 37 U/L Final  . ALT 08/14/2014 12  0 - 53 U/L Final  . Total Protein 08/14/2014 7.1  6.0 - 8.3 g/dL Final  . Albumin 47/82/9562 4.1  3.5 - 5.2 g/dL Final  . Calcium 13/02/6577 9.4  8.4 - 10.5 mg/dL Final  . GFR 46/96/2952 124.74  >60.00 mL/min Final  . Cholesterol 08/14/2014 133  0 - 200 mg/dL Final   ATP III Classification       Desirable:  < 200 mg/dL  Borderline High:  200 - 239 mg/dL          High:  > = 161240 mg/dL  . Triglycerides 08/14/2014 196.0* 0.0 - 149.0 mg/dL Final   Normal:  <096<150 mg/dLBorderline High:  150 - 199 mg/dL  . HDL 08/14/2014 40.40  >39.00 mg/dL Final  . VLDL 04/54/098102/07/2014 39.2  0.0 - 40.0 mg/dL Final  . LDL Cholesterol 08/14/2014 53  0 - 99 mg/dL Final  . Total CHOL/HDL Ratio 08/14/2014 3   Final                  Men          Women1/2 Average Risk     3.4          3.3Average Risk          5.0          4.42X Average Risk          9.6          7.13X Average Risk          15.0          11.0                      . NonHDL 08/14/2014 92.60   Final   NOTE:  Non-HDL goal should be 30 mg/dL higher than patient's LDL goal (i.e. LDL goal of < 70 mg/dL, would have non-HDL goal of < 100 mg/dL)  . Microalb, Ur 08/14/2014 1.1  0.0 - 1.9 mg/dL Final  . Creatinine,U 19/14/782902/07/2014 135.6   Final  . Microalb Creat Ratio 08/14/2014 0.8  0.0 - 30.0 mg/g Final      Medication List       This list is accurate as of: 08/17/14  3:40 PM.  Always use your most recent med list.               aspirin 81 MG tablet  Take 81 mg by mouth daily.     bimatoprost 0.03 % ophthalmic solution    Commonly known as:  LUMIGAN  1 drop at bedtime.     cetirizine 10 MG tablet  Commonly known as:  ZYRTEC  Take 10 mg by mouth daily.     chlordiazePOXIDE 10 MG capsule  Commonly known as:  LIBRIUM  Take 10 mg by mouth daily.     chlordiazePOXIDE 25 MG capsule  Commonly known as:  LIBRIUM  Take 25 mg by mouth daily.     esomeprazole 40 MG capsule  Commonly known as:  NEXIUM  Take 1 capsule (40 mg total) by mouth daily.     glucose blood test strip  Commonly known as:  FREESTYLE LITE  Use as instructed to check blood sugars 2 times per day dx code 250.00     HYDROcodone-acetaminophen 5-325 MG per tablet  Commonly known as:  NORCO/VICODIN  Take 1 tablet by mouth every 6 (six) hours as needed for pain.     irbesartan-hydrochlorothiazide 150-12.5 MG per tablet  Commonly known as:  AVALIDE  Take 1 tablet by mouth daily.     Linaclotide 145 MCG Caps capsule  Commonly known as:  LINZESS  Take 1 capsule (145 mcg total) by mouth daily.     metoprolol succinate 25 MG 24 hr tablet  Commonly known as:  TOPROL-XL  Take 25 mg by mouth daily.     Miconazole Nitrate 2 % Aerp  Apply to groin BID  potassium chloride 10 MEQ tablet  Commonly known as:  K-DUR,KLOR-CON  Take 10 mEq by mouth 2 (two) times daily.     ranitidine 150 MG capsule  Commonly known as:  ZANTAC  Take 150 mg by mouth 2 (two) times daily.     sertraline 100 MG tablet  Commonly known as:  ZOLOFT  Take 100 mg by mouth daily.     simvastatin 40 MG tablet  Commonly known as:  ZOCOR  Take 40 mg by mouth every evening.     sitaGLIPtin-metformin 50-1000 MG per tablet  Commonly known as:  JANUMET  Take 1 tablet by mouth daily.     tamsulosin 0.4 MG Caps capsule  Commonly known as:  FLOMAX  Take 0.4 mg by mouth.     VENTOLIN HFA 108 (90 BASE) MCG/ACT inhaler  Generic drug:  albuterol     zolpidem 10 MG tablet  Commonly known as:  AMBIEN  Take 10 mg by mouth at bedtime as needed for sleep.         Allergies:  Allergies  Allergen Reactions  . Penicillins Rash    Past Medical History  Diagnosis Date  . COPD (chronic obstructive pulmonary disease)   . Anxiety   . Arrhythmia   . Asthma   . Chronic headaches   . COPD (chronic obstructive pulmonary disease)   . Depression   . Diabetes   . Diverticulosis   . GERD (gastroesophageal reflux disease)   . Glaucoma   . Hyperlipidemia   . Hypertension   . IBS (irritable bowel syndrome)   . Pneumonia   . Sleep apnea     Past Surgical History  Procedure Laterality Date  . Rotator cuff repair  2008  . Lumbar spine surgery  2010    Family History  Problem Relation Age of Onset  . Diabetes      Social History:  reports that he has never smoked. He has never used smokeless tobacco. He reports that he does not drink alcohol or use illicit drugs.  Review of Systems:  HYPERTENSION:  appears well controlled with Avalide which he has taken for years  On Toprol for  fast heart rate  HYPERLIPIDEMIA: The lipid abnormality consists of elevated LDL and low HDL treated with simvastatin; his HDL is improved .   Lab Results  Component Value Date   CHOL 133 08/14/2014   HDL 40.40 08/14/2014   LDLCALC 53 08/14/2014   TRIG 196.0* 08/14/2014   CHOLHDL 3 08/14/2014     Numbness in feet: Mild on left since back surgery    Examination:   BP 132/72 mmHg  Pulse 70  Temp(Src) 98 F (36.7 C)  Resp 14  Ht 5\' 10"  (1.778 m)  Wt 174 lb 3.2 oz (79.017 kg)  BMI 25.00 kg/m2  SpO2 98%  Body mass index is 25 kg/(m^2).   Diabetic foot exam shows normal monofilament sensation in the toes and plantar surfaces, no skin lesions or ulcers on the feet and normal pedal pulses No edema  ASSESSMENT/ PLAN:   Diabetes type 2   The patient's diabetes control appears to be  excellent again with near-normal blood sugars  and upper normal in A1c  He is still fairly consistent with his diet and walking program Weight is stable He has however  not checked his blood sugars and discussed need to check them at least once a week to make sure it does not  He can continue the  [facility]  2 [facility] these daily Discussed again blood sugar targets and to check periodically after meals also  Hypertension: Well controlled  HYPERCHOLESTEROLEMIA: LDL is at target but HDL still low normal although better.  Continue simvastatin    Griffen Frayne 08/17/2014, 3:40 PM

## 2014-08-25 ENCOUNTER — Ambulatory Visit: Payer: Medicare Other | Admitting: Cardiovascular Disease

## 2014-10-12 NOTE — Telephone Encounter (Signed)
error 

## 2014-10-31 ENCOUNTER — Other Ambulatory Visit: Payer: Self-pay | Admitting: Endocrinology

## 2014-11-09 ENCOUNTER — Encounter: Payer: Self-pay | Admitting: Cardiovascular Disease

## 2014-11-09 ENCOUNTER — Ambulatory Visit (INDEPENDENT_AMBULATORY_CARE_PROVIDER_SITE_OTHER): Payer: Medicare Other | Admitting: Cardiovascular Disease

## 2014-11-09 VITALS — BP 122/78 | HR 74 | Ht 70.0 in | Wt 179.0 lb

## 2014-11-09 DIAGNOSIS — I10 Essential (primary) hypertension: Secondary | ICD-10-CM

## 2014-11-09 DIAGNOSIS — E119 Type 2 diabetes mellitus without complications: Secondary | ICD-10-CM | POA: Diagnosis not present

## 2014-11-09 DIAGNOSIS — E785 Hyperlipidemia, unspecified: Secondary | ICD-10-CM

## 2014-11-09 NOTE — Patient Instructions (Signed)
Dr. Croitoru recommends that you schedule a follow-up appointment in: One Year.   

## 2014-11-09 NOTE — Progress Notes (Signed)
Patient ID: Derek [name], male   DOB: [birth year], 69 y.o.   MRN: [id number]     Cardiology Office Note   Date:  11/09/2014   ID:  DILRAJ KILLGORE, DOB [birth year], MRN [id number]  PCP:  Eino Farber, MD  Cardiologist:   Thurmon Fair, MD   Chief complaint: Follow-up on cardiac risk factors and previous heart tests   History of Present Illness: Derek [name] is a 69 y.o. male who presents for follow-up on hyperlipidemia, type 2 diabetes mellitus, hypertension and mild abnormalities in previous noninvasive cardiac testing. He feels well. He walks in the park 3-4 times a week and tries to push himself whatever he has to go up steep hills. He can easily out paces wife and does not need to stop and catch his breath like she does at the top of the hill. He denies any chest pain, palpitations, syncope or lower extremity edema.  He has well-controlled diabetes mellitus with a hemoglobin A1c of only 6.2%. On statin therapy he has excellent LDL levels and only mildly elevated triglycerides. He is only borderline overweight with a BMI just over 25. He has a 35 inch waistline. He does not smoke. 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    Past Medical History  Diagnosis Date  . COPD (chronic obstructive pulmonary disease)   . Anxiety   . Arrhythmia   . Asthma   . Chronic headaches   . COPD (chronic obstructive pulmonary disease)   . Depression   . Diabetes   . Diverticulosis   . GERD (gastroesophageal reflux disease)   . Glaucoma   . Hyperlipidemia   . Hypertension   . IBS (irritable bowel syndrome)   . Pneumonia   . Sleep apnea     Past Surgical History  Procedure Laterality Date  . Rotator cuff repair  2008  . Lumbar spine surgery  2010     Current Outpatient Prescriptions  Medication Sig Dispense Refill  . aspirin 81 MG tablet Take 81 mg by mouth daily.    . bimatoprost (LUMIGAN) 0.03 % ophthalmic solution 1 drop at bedtime.    . chlordiazePOXIDE (LIBRIUM) 25 MG capsule Take 25 mg  by mouth daily.  1  . esomeprazole (NEXIUM) 40 MG capsule Take 1 capsule (40 mg total) by mouth daily. 30 capsule 3  . glucose blood (FREESTYLE LITE) test strip Use as instructed to check blood sugars 2 times per day dx code 250.00 100 each 5  . HYDROcodone-acetaminophen (NORCO/VICODIN) 5-325 MG per tablet Take 1 tablet by mouth every 6 (six) hours as needed for pain.    Marland Kitchen irbesartan-hydrochlorothiazide (AVALIDE) 150-12.5 MG per tablet Take 1 tablet by mouth daily.    Marland Kitchen JANUMET 50-1000 MG per tablet TAKE 1 TABLET BY MOUTH DAILY. 30 tablet 3  . LUMIGAN 0.01 % SOLN   11  . metoprolol succinate (TOPROL-XL) 25 MG 24 hr tablet Take 25 mg by mouth daily.    . Miconazole Nitrate 2 % AERP Apply to groin BID 85 g 1  . potassium chloride (K-DUR,KLOR-CON) 10 MEQ tablet Take 10 mEq by mouth 2 (two) times daily.    . ranitidine (ZANTAC) 150 MG capsule Take 150 mg by mouth 2 (two) times daily.    . sertraline (ZOLOFT) 100 MG tablet Take 100 mg by mouth daily.    . simvastatin (ZOCOR) 20 MG tablet Take 20 mg by mouth at bedtime.  4  . simvastatin (ZOCOR) 40 MG tablet Take 40 mg  by mouth every evening.    . tamsulosin (FLOMAX) 0.4 MG CAPS capsule Take 0.4 mg by mouth.    . VENTOLIN HFA 108 (90 BASE) MCG/ACT inhaler   4  . zolpidem (AMBIEN) 10 MG tablet Take 10 mg by mouth at bedtime as needed for sleep.     No current facility-administered medications for this visit.    Allergies:   Penicillins    Social History:  The patient  reports that he has never smoked. He has never used smokeless tobacco. He reports that he does not drink alcohol or use illicit drugs.   Family History:  The patient's family history includes Arthritis in his mother; Diabetes in an other family member; Heart failure in his father; Hypertension in his mother.    ROS:  Please see the history of present illness.    Otherwise, review of systems positive for The patient specifically denies any chest pain at rest or with exertion,  dyspnea at rest or with exertion, orthopnea, paroxysmal nocturnal dyspnea, syncope, palpitations, focal neurological deficits, intermittent claudication, lower extremity edema, unexplained weight gain, cough, hemoptysis or wheezing.  The patient also denies abdominal pain, nausea, vomiting, dysphagia, diarrhea, constipation, polyuria, polydipsia, dysuria, hematuria, frequency, urgency, abnormal bleeding or bruising, fever, chills, unexpected weight changes, mood swings, change in skin or hair texture, change in voice quality, auditory or visual problems, allergic reactions or rashes, new musculoskeletal complaints other than usual "aches and pains". .   All other systems are reviewed and negative.    PHYSICAL EXAM: VS:  BP 122/78 mmHg  Pulse 74  Ht  (1.778 m)  Wt 179 lb (81.194 kg)  BMI 25.68 kg/m2 , BMI Body mass index is 25.68 kg/(m^2).  General: Alert, oriented x3, no distress Head: no evidence of trauma, PERRL, EOMI, no exophtalmos or lid lag, no myxedema, no xanthelasma; normal ears, nose and oropharynx Neck: normal jugular venous pulsations and no hepatojugular reflux; brisk carotid pulses without delay and no carotid bruits Chest: clear to auscultation, no signs of consolidation by percussion or palpation, normal fremitus, symmetrical and full respiratory excursions Cardiovascular: normal position and quality of the apical impulse, regular rhythm, normal first and second heart sounds, no murmurs, rubs or gallops Abdomen: no tenderness or distention, no masses by palpation, no abnormal pulsatility or arterial bruits, normal bowel sounds, no hepatosplenomegaly Extremities: no clubbing, cyanosis or edema; 2+ radial, ulnar and brachial pulses bilaterally; 2+ right femoral, posterior tibial and dorsalis pedis pulses; 2+ left femoral, posterior tibial and dorsalis pedis pulses; no subclavian or femoral bruits Neurological: grossly nonfocal Psych: euthymic mood, full affect   EKG:  EKG  is ordered today. The ekg ordered today demonstrates normal sinus rhythm, delayed anterior R-wave progression, no repolarization abnormalities   Recent Labs: 08/14/2014: ALT 12; BUN 9; Creatinine 0.79; Potassium 3.7; Sodium 140    Lipid Panel    Component Value Date/Time   CHOL 133 08/14/2014 1357   TRIG 196.0* 08/14/2014 1357   HDL 40.40 08/14/2014 1357   CHOLHDL 3 08/14/2014 1357   VLDL 39.2 08/14/2014 1357   LDLCALC 53 08/14/2014 1357      Wt Readings from Last 3 Encounters:  11/09/14 179 lb (81.194 kg)  08/17/14 174 lb 3.2 oz (79.017 kg)  07/28/14 173 lb (78.472 kg)     ASSESSMENT AND PLAN:  Mr. Cozart has excellent functional status and is physically active and generally fit. His blood pressure is well controlled. I think the medications chosen by his primary care physician  and endocrinologist are excellent. He is only minimally overweight. He specifically inquires about ways to improve his diet. Keeping in mind the presence of type 2 diabetes mellitus and the mildly elevated triglycerides I advised a diet low in carbohydrates, especially low in sweets and starches with high glycemic index. I encouraged continued daily exercise. Target weight of around the 170 pounds and/or waistline less than 34 inches. Offered follow-up as needed but he would like to continue yearly visits.  Current medicines are reviewed at length with the patient today.  The patient does not have concerns regarding medicines.  The following changes have been made:  no change  Labs/ tests ordered today include:  No orders of the defined types were placed in this encounter.    Patient Instructions  Dr. Royann Shiversroitoru recommends that you schedule a follow-up appointment in: One Year.       Joie BimlerSigned, Aikam Hellickson, MD  11/09/2014 5:17 PM    Thurmon FairMihai Zadyn Yardley, MD, [facility]FACC [facility] [phone] office [phone] pager

## 2014-11-10 ENCOUNTER — Telehealth: Payer: Self-pay | Admitting: Cardiovascular Disease

## 2014-11-10 NOTE — Telephone Encounter (Signed)
Returned call to patient he stated he saw Dr.Croitoru yesterday 11/09/14.Stated he forgot to ask Dr.Croitoru if he should continue metoprolol and aspirin.Advised to continue current medications.Message sent to Dr.Croitoru for review.

## 2014-11-10 NOTE — Telephone Encounter (Signed)
Yes, please continue the same meds for now

## 2014-11-10 NOTE — Telephone Encounter (Signed)
Pt called in wanting to know if he should continue his regime of taking Metoprolol and an aspirin every day. Please f/u  Thanks

## 2014-11-15 NOTE — Telephone Encounter (Signed)
Can this encounter be closed?

## 2014-12-08 ENCOUNTER — Ambulatory Visit (INDEPENDENT_AMBULATORY_CARE_PROVIDER_SITE_OTHER): Payer: Medicare Other | Admitting: Family Medicine

## 2014-12-08 VITALS — BP 110/80 | HR 82 | Temp 98.9°F | Resp 17 | Ht 69.5 in | Wt 178.8 lb

## 2014-12-08 DIAGNOSIS — M7989 Other specified soft tissue disorders: Secondary | ICD-10-CM

## 2014-12-08 MED ORDER — PREDNISONE 20 MG PO TABS: 40.0000 mg | ORAL_TABLET | Freq: Every day | ORAL | Status: DC

## 2014-12-08 NOTE — Patient Instructions (Signed)
Ok to continue benadryl today (caution with lightheadedness, sedation, and if trouble initiating urination - stop the benadryl). Keep hand elevated today.  If not improving tonight or tomorrow morning, can start prednisone as discussed. Monitor blood sugars on prednisone and if over 250 - return to discuss this. Once swelling starts to improve, can change benadryl to claritin once per day.  Return to the clinic or go to the nearest emergency room if any of your symptoms worsen or new symptoms occur.

## 2014-12-08 NOTE — Progress Notes (Signed)
Subjective:    Patient ID: Derek [name], male    DOB: [birth year], 69 y.o.   MRN: [id number] This chart was scribed for Meredith Staggers, MD by Chestine Spore, ED Scribe. The patient was seen in room 1 at 1:38 PM.   Chief Complaint  Patient presents with  . Angioedema    left hand-no pain    HPI  Derek [name] is a 69 y.o. male with a medical hx of GERD, HTN, type II DM, COPD, who presents today complaining of left hand swelling onset 2 days ago. Pt symptoms began with hives on the top of his left hand. Pt reports that he went to see his PCP yesterday and was informed that he may be allergic to something. Pt has had swelling in his left hand that occurs every 3-4 years and it starts as an hives appearance. Pt notes that he has angioedema and that it will have a hive patch and then go away. Pt denies pain or itching of the left hand. Pt denies being bit by something. Pt reports that in the past the pt will take prednisone for his ailments daily for 3-4 days. Pt took 2 benadryl and aleve last night with no relief for his symptoms. He denies fevers and any other symptoms. Denies any new medications/creams/lotions/dermatological products. Pt denies taking any daily antihistamines.   Pt last A1C was 6.2 on 08/14/14 and he does have a blood sugar meter at home. Pt blood sugar typically run 90-100 with his medications.       Patient Active Problem List   Diagnosis Date Noted  . Leaky heart valve 05/12/2014  . GERD (gastroesophageal reflux disease) 10/11/2013  . Belching 10/11/2013  . Essential hypertension 03/16/2013  . Hyperlipidemia 03/16/2013  . Type 2 diabetes mellitus 03/07/2013   Past Medical History  Diagnosis Date  . COPD (chronic obstructive pulmonary disease)   . Anxiety   . Arrhythmia   . Asthma   . Chronic headaches   . COPD (chronic obstructive pulmonary disease)   . Depression   . Diabetes   . Diverticulosis   . GERD (gastroesophageal reflux disease)   . Glaucoma   .  Hyperlipidemia   . Hypertension   . IBS (irritable bowel syndrome)   . Pneumonia   . Sleep apnea    Past Surgical History  Procedure Laterality Date  . Rotator cuff repair  2008  . Lumbar spine surgery  2010   Allergies  Allergen Reactions  . Penicillins Rash   Prior to Admission medications   Medication Sig Start Date End Date Taking? Authorizing Provider  aspirin 81 MG tablet Take 81 mg by mouth daily.   Yes Historical Provider, MD  bimatoprost (LUMIGAN) 0.03 % ophthalmic solution 1 drop at bedtime.   Yes Historical Provider, MD  chlordiazePOXIDE (LIBRIUM) 25 MG capsule Take 25 mg by mouth daily. 08/03/14  Yes Historical Provider, MD  esomeprazole (NEXIUM) 40 MG capsule Take 1 capsule (40 mg total) by mouth daily. 10/11/13  Yes Jessica D Zehr, PA-C  glucose blood (FREESTYLE LITE) test strip Use as instructed to check blood sugars 2 times per day dx code 250.00 07/21/13  Yes Reather Littler, MD  HYDROcodone-acetaminophen (NORCO/VICODIN) 5-325 MG per tablet Take 1 tablet by mouth every 6 (six) hours as needed for pain.   Yes Historical Provider, MD  irbesartan-hydrochlorothiazide (AVALIDE) 150-12.5 MG per tablet Take 1 tablet by mouth daily.   Yes Historical Provider, MD  JANUMET 50-1000 MG per tablet  TAKE 1 TABLET BY MOUTH DAILY. 10/31/14  Yes Reather Littler, MD  LUMIGAN 0.01 % SOLN  10/16/14  Yes Historical Provider, MD  metoprolol succinate (TOPROL-XL) 25 MG 24 hr tablet Take 25 mg by mouth daily.   Yes Historical Provider, MD  Miconazole Nitrate 2 % AERP Apply to groin BID 10/01/13  Yes Carmelina Dane, MD  potassium chloride (K-DUR,KLOR-CON) 10 MEQ tablet Take 10 mEq by mouth 2 (two) times daily.   Yes Historical Provider, MD  ranitidine (ZANTAC) 150 MG capsule Take 150 mg by mouth 2 (two) times daily.   Yes Historical Provider, MD  sertraline (ZOLOFT) 100 MG tablet Take 100 mg by mouth daily.   Yes Historical Provider, MD  simvastatin (ZOCOR) 20 MG tablet Take 20 mg by mouth at bedtime.  10/25/14  Yes Historical Provider, MD  simvastatin (ZOCOR) 40 MG tablet Take 40 mg by mouth every evening.   Yes Historical Provider, MD  tamsulosin (FLOMAX) 0.4 MG CAPS capsule Take 0.4 mg by mouth.   Yes Historical Provider, MD  VENTOLIN HFA 108 (90 BASE) MCG/ACT inhaler  07/17/14  Yes Historical Provider, MD  zolpidem (AMBIEN) 10 MG tablet Take 10 mg by mouth at bedtime as needed for sleep.   Yes Historical Provider, MD      Review of Systems  Constitutional: Negative for fever.  Musculoskeletal: Positive for joint swelling (left hand). Negative for arthralgias.  Skin: Negative for wound.       Objective:   Physical Exam  Constitutional: He is oriented to person, place, and time. He appears well-developed and well-nourished. No distress.  HENT:  Head: Normocephalic and atraumatic.  Eyes: EOM are normal.  Neck: Neck supple. No tracheal deviation present.  Cardiovascular: Normal rate.   Pulmonary/Chest: Effort normal. No respiratory distress.  Musculoskeletal: Normal range of motion.       Left shoulder: Normal.       Left elbow: Normal.       Left wrist: He exhibits swelling. He exhibits normal range of motion, no bony tenderness, no deformity and no laceration.  FROM of left shoulder and left elbow with no focal tenderness. No pain or redness on upper or forearm with no swelling noted.  Left wrist: FROM with no bony tenderness. Soft tissue swelling across dorsum of left hand that stops approximately at the MCP joint. No rash and no wounds. minimal warmth no redness. NVI distally with cap refill less than 1 second. Finger tips are warm. Arm veins flattened with left arm elevation.   Neurological: He is alert and oriented to person, place, and time.  Skin: Skin is warm and dry.  Psychiatric: He has a normal mood and affect. His behavior is normal.  Nursing note and vitals reviewed.        BP 110/80 mmHg  Pulse 82  Temp(Src) 98.9 F (37.2 C) (Oral)  Resp 17  Ht 5' 9.5"  (1.765 m)  Wt 178 lb 12.8 oz (81.103 kg)  BMI 26.03 kg/m2  SpO2 98%  Assessment & Plan:   Derek [name] is a 69 y.o. male Swelling of left hand - Plan: predniSONE (DELTASONE) 20 MG tablet  - allergic vs. Inflammatory. No known bite/sting.  Hx of similar sx's in past by history that improve with prednisone.   -continue low dose benadryl, elevate hand o/n, then if not improving in next 24 hrs, start prednisone  QD for 3 days. Follow cbg's on prednisone.   -as improving - can change to claritin.   -  rtc precautions.   Meds ordered this encounter  Medications  . predniSONE (DELTASONE) 20 MG tablet    Sig: Take 2 tablets (40 mg total) by mouth daily with breakfast.    Dispense:  6 tablet    Refill:  0   Patient Instructions  Ok to continue benadryl today (caution with lightheadedness, sedation, and if trouble initiating urination - stop the benadryl). Keep hand elevated today.  If not improving tonight or tomorrow morning, can start prednisone as discussed. Monitor blood sugars on prednisone and if over 250 - return to discuss this. Once swelling starts to improve, can change benadryl to claritin once per day.  Return to the clinic or go to the nearest emergency room if any of your symptoms worsen or new symptoms occur.     I personally performed the services described in this documentation, which was scribed in my presence. The recorded information has been reviewed and considered, and addended by me as needed.

## 2014-12-25 ENCOUNTER — Telehealth: Payer: Self-pay | Admitting: Cardiovascular Disease

## 2014-12-25 MED ORDER — METOPROLOL SUCCINATE ER 25 MG PO TB24: 25.0000 mg | ORAL_TABLET | Freq: Every day | ORAL | Status: DC

## 2014-12-25 NOTE — Telephone Encounter (Signed)
°  1. Which medications need to be refilled? Metoprolol-new prescription  2. Which pharmacy is medication to be sent to?[phone]  3. Do they need a 30 day or 90 day supply? 90 and refills  4. Would they like a call back once the medication has been sent to the pharmacy? yes

## 2014-12-25 NOTE — Telephone Encounter (Signed)
Refill submitted to patient's preferred pharmacy. Informed patient. Pt voiced understanding, no other stated concerns at this time.  

## 2015-01-03 ENCOUNTER — Ambulatory Visit (INDEPENDENT_AMBULATORY_CARE_PROVIDER_SITE_OTHER): Payer: Medicare Other | Admitting: Podiatry

## 2015-01-03 DIAGNOSIS — M79676 Pain in unspecified toe(s): Secondary | ICD-10-CM

## 2015-01-03 DIAGNOSIS — E119 Type 2 diabetes mellitus without complications: Secondary | ICD-10-CM

## 2015-01-03 DIAGNOSIS — B351 Tinea unguium: Secondary | ICD-10-CM

## 2015-01-03 DIAGNOSIS — M79675 Pain in left toe(s): Secondary | ICD-10-CM

## 2015-01-03 DIAGNOSIS — M79674 Pain in right toe(s): Secondary | ICD-10-CM

## 2015-01-03 NOTE — Progress Notes (Signed)
Patient ID: Derek [name], male   DOB: [birth year], 69 y.o.   MRN: [id number] Complaint:  Visit Type: Patient returns to my office for continued preventative foot care services. Complaint: Patient states" my nails have grown long and thick and become painful to walk and wear shoes" Patient has been diagnosed with DM with no complications. He presents for preventative foot care services. No changes to ROS  Podiatric Exam: Vascular: dorsalis pedis and posterior tibial pulses are palpable bilateral. Capillary return is immediate. Temperature gradient is WNL. Skin turgor WNL  Sensorium: Normal Semmes Weinstein monofilament test. Normal tactile sensation bilaterally. Nail Exam: Pt has thick disfigured discolored nails with subungual debris noted bilateral entire nail hallux through fifth toenails Ulcer Exam: There is no evidence of ulcer or pre-ulcerative changes or infection. Orthopedic Exam: Muscle tone and strength are WNL. No limitations in general ROM. No crepitus or effusions noted. Foot type and digits show no abnormalities. Bony prominences are unremarkable. Skin: No Porokeratosis. No infection or ulcers  Diagnosis:  Tinea unguium, Pain in right toe, pain in left toes  Treatment & Plan Procedures and Treatment: Consent by patient was obtained for treatment procedures. The patient understood the discussion of treatment and procedures well. All questions were answered thoroughly reviewed. Debridement of mycotic and hypertrophic toenails, 1 through 5 bilateral and clearing of subungual debris. No ulceration, no infection noted.  Return Visit-Office Procedure: Patient instructed to return to the office for a follow up visit 3 months for continued evaluation and treatment.

## 2015-01-08 ENCOUNTER — Other Ambulatory Visit: Payer: Self-pay

## 2015-01-23 ENCOUNTER — Ambulatory Visit: Payer: Medicare Other | Attending: Pulmonary Disease | Admitting: Physical Therapy

## 2015-01-23 ENCOUNTER — Encounter: Payer: Self-pay | Admitting: Physical Therapy

## 2015-01-23 DIAGNOSIS — M5442 Lumbago with sciatica, left side: Secondary | ICD-10-CM | POA: Insufficient documentation

## 2015-01-23 DIAGNOSIS — M2560 Stiffness of unspecified joint, not elsewhere classified: Secondary | ICD-10-CM | POA: Diagnosis present

## 2015-01-23 DIAGNOSIS — M6281 Muscle weakness (generalized): Secondary | ICD-10-CM | POA: Diagnosis present

## 2015-01-23 NOTE — Therapy (Signed)
[facility] [address] [place], [place], [address] Phone: [phone]   Fax:  [phone]  Physical Therapy Evaluation  Patient Details  Name: Derek [name] MRN: [id number] Date of Birth: [birth year] Referring Provider:  Corine Shelter, MD  Encounter Date: 01/23/2015      PT End of Session - 01/23/15 1737    Visit Number 1   Number of Visits 16   Date for PT Re-Evaluation 03/20/15   Authorization Type [employer] Medicare G code and PT progress note at visit 10; 15th visit KX   PT Start Time 1145   PT Stop Time 1235   PT Time Calculation (min) 50 min   Activity Tolerance Patient tolerated treatment well      Past Medical History  Diagnosis Date  . COPD (chronic obstructive pulmonary disease)   . Anxiety   . Arrhythmia   . Asthma   . Chronic headaches   . COPD (chronic obstructive pulmonary disease)   . Depression   . Diabetes   . Diverticulosis   . GERD (gastroesophageal reflux disease)   . Glaucoma   . Hyperlipidemia   . Hypertension   . IBS (irritable bowel syndrome)   . Pneumonia   . Sleep apnea     Past Surgical History  Procedure Laterality Date  . Rotator cuff repair  2008  . Lumbar spine surgery  2010    There were no vitals filed for this visit.  Visit Diagnosis:  Bilateral low back pain with left-sided sciatica - Plan: PT plan of care cert/re-cert  Joint stiffness of spine - Plan: PT plan of care cert/re-cert  Muscle weakness - Plan: PT plan of care cert/re-cert      Subjective Assessment - 01/23/15 1157    Subjective Long history of back pain with surgery 2010.  Sudden worsening of symptoms in January and had to use walker for 2 months.  Saw surgeon and he talked about more surgery but got some better with muscle relaxers.  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 2 hours   How long can you stand comfortably? 1 hour   How long can you walk comfortably? 1 hour  uses cane only in community   Diagnostic tests 5-6 years ago MRI   Patient Stated Goals I want to prevent more surgery   Currently in Pain? Yes   Pain Score 4    Pain Location Back   Pain Orientation Left;Right   Pain Type Chronic pain   Pain Radiating Towards left foot numbness since surgery   Pain Onset More than a month ago   Pain Frequency Intermittent   Aggravating Factors  walking, standing;  when drive too long hands will lock on steering wheel   Pain Relieving Factors seated bending over 20-30 sec 3x; lying            [facility] PT Assessment - 01/23/15 1210    Assessment   Medical Diagnosis low back pain   Onset Date/Surgical Date --  January   Hand Dominance Right   Next MD Visit September   Prior Therapy after RTC   Precautions   Precautions None   Restrictions   Weight Bearing Restrictions No   Balance Screen   Has the patient fallen in the past 6  months Yes   How many times? 1  January-fell at night   Has the patient had a decrease in activity level because of a fear of falling?  Yes   Is the patient reluctant to leave their home because of a fear of falling?  Yes   Home Environment   Living Environment Private residence   Living Arrangements Spouse/significant other   Type of Home House   Home Access Level entry   Home Layout One level   Home Equipment Walker - standard;Cane - single point   Prior Function   Level of Independence Independent   Vocation Retired   Leisure go to movies, walk around the park   Observation/Other Assessments   Focus on Therapeutic Outcomes (FOTO)  57% limit   Posture/Postural Control   Posture/Postural Control Postural limitations   Postural Limitations Decreased lumbar lordosis   ROM / Strength   AROM / PROM / Strength AROM;Strength   AROM    AROM Assessment Site Lumbar   Lumbar Flexion 50   Lumbar Extension 15   Lumbar - Right Side Bend 20   Lumbar - Left Side Bend 30   Strength   Strength Assessment Site Lumbar;Hip;Knee;Ankle   Right/Left Hip Right;Left   Right Hip Flexion 4/5   Right Hip Extension 4/5   Right Hip ABduction 4-/5   Left Hip Flexion 4/5   Left Hip Extension 4/5   Left Hip ABduction 4-/5   Right/Left Knee Right;Left   Right Knee Flexion 4/5   Right Knee Extension 4/5   Left Knee Flexion 4/5   Left Knee Extension 4/5   Right/Left Ankle Right;Left   Right Ankle Dorsiflexion 4/5   Right Ankle Plantar Flexion 4/5   Right Ankle Inversion 4/5   Right Ankle Eversion 4/5   Left Ankle Dorsiflexion 4-/5   Left Ankle Plantar Flexion 4-/5   Left Ankle Inversion 4-/5   Left Ankle Eversion 4-/5   Lumbar Flexion 4/5   Lumbar Extension 4/5   Flexibility   Soft Tissue Assessment /Muscle Length yes   Hamstrings 85   Quadriceps --  decreased hip flexor length right > left                           PT Education - 01/23/15 1733    Education provided Yes   Education Details abdominal brace, prone multifidi press   Person(s) Educated Patient   Methods Explanation;Demonstration;Handout   Comprehension Verbalized understanding;Returned demonstration          PT Short Term Goals - 01/23/15 1750    PT SHORT TERM GOAL #1   Title The patient will have improved hip flexor muscle length and improved hip extension to 10 degrees bilaterally needed for walking in the community and park   Baseline 8/9   Time 4   Period Weeks   Status New   PT SHORT TERM GOAL #2   Title The patient will have improved lumbar AROM with flexion to 60 degrees, extension 20 degrees needed for greater ease getting in/out of the car   Time 4   Period Weeks   Status New   PT SHORT TERM GOAL #3   Title The patient will report and overall improvement in pain and function 25%   Time 4   Period Weeks   Status New            PT Long Term Goals - 01/23/15 1753    PT LONG  TERM GOAL #1   Title The patient will be independent in a HEP for further gains in ROM and strength needed for daily function   Baseline 9/6   Time 8   Period Weeks   Status New   PT LONG TERM GOAL #2   Title Abdominal and trunk extensor strength 4+/5 needed for standing/walking longer periods of time at the store and park   Time 8   Period Weeks   Status New   PT LONG TERM GOAL #3   Title Bilateral hip abduction strength improved to 4/5 needed for standing and walking longer periods of time with greater ease   Time 8   Period Weeks   Status New   PT LONG TERM GOAL #4   Title Patient will report overall improvement in pain and function at 50%   Time 8   Period Weeks   Status New   PT LONG TERM GOAL #5   Title FOTO functional outcome score improved from 57% limitation to 46% indicating improved function with less pain   Time 8   Period Weeks   Status New               Plan - 01/23/15 1738    Clinical Impression Statement The patient is a [age] year old male with a long history of LBP and left LE radiculopathy.  He first began to have pain in 2010 with a sudden onset while walking. He had back surgery and did well until January his pain worsened so badly that he had to use a walker for 2 months.  He eventually improved to wean to a single point cane.  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  He has also been told he has peripheral neuropathy in "both arms and legs" and his left foot is constantly numb on the bottom since 2010.  His lumbar AROM is limited:  flex 50, ext 15, left sidebend 30, right sidebend 20.  Negative SLR test.  Negative slump test.  Decreased right > left hip flexor length.  Bilateral LE strength  4/5 except B hip abd 4-/5 and left ankle 4-/5.  Patient able to rise from a standard chair without UE  assist.  Decreased activation of transverse abdominals and multifidi. 4/5.  Possible balance deficits secondary to neuropathy with 1 fall.  Patient would benefit from PT to address these deficits.     Pt will benefit from skilled therapeutic intervention in order to improve on the following deficits Decreased range of motion;Decreased strength;Pain;Hypomobility;Impaired flexibility   Rehab Potential Good   PT Frequency 2x / week   PT Duration 8 weeks   PT Treatment/Interventions ADLs/Self Care Home Management;Electrical Stimulation;Moist Heat;Therapeutic exercise;Manual techniques;Dry needling;Taping;Traction   PT Next Visit Plan Do BERG; progress supine abdominal brace series and  prone multifidi series; stretch hip flexors bilaterally in sidelying or prone;  gluteus medius strengthening;modalities if needed   PT Home Exercise Plan supine abdominal brace; prone pelvic press          G-Codes - 01/23/15 1758    Functional Assessment Tool Used FOTO; clinical judgement   Functional Limitation Mobility: Walking and moving around   Mobility: Walking and Moving Around Current Status ([id number]) At least 40 percent but less than 60 percent impaired, limited or restricted   Mobility: Walking and Moving Around Goal Status (Y7829(G8979) At least 20 percent but less than 40 percent  impaired, limited or restricted       Problem List Patient Active Problem List   Diagnosis Date Noted  . Leaky heart valve 05/12/2014  . GERD (gastroesophageal reflux disease) 10/11/2013  . Belching 10/11/2013  . Essential hypertension 03/16/2013  . Hyperlipidemia 03/16/2013  . Type 2 diabetes mellitus 03/07/2013    Vivien Presto 01/23/2015, 6:00 PM  [facility] [address] [place], [place], [address] Phone: [phone]   Fax:  [phone]  Lavinia Sharps, PT 01/23/2015 6:01 PM Phone: [phone] Fax: [phone]

## 2015-01-23 NOTE — Patient Instructions (Signed)
Lying on your back abdominal brace handout 5x 5 sec hold multiple x per day  Lie face down, pelvic press handout 5x 5 sec hold

## 2015-01-25 ENCOUNTER — Ambulatory Visit: Payer: Medicare Other | Admitting: Physical Therapy

## 2015-01-25 DIAGNOSIS — M6281 Muscle weakness (generalized): Secondary | ICD-10-CM

## 2015-01-25 DIAGNOSIS — M2560 Stiffness of unspecified joint, not elsewhere classified: Secondary | ICD-10-CM

## 2015-01-25 DIAGNOSIS — M5442 Lumbago with sciatica, left side: Secondary | ICD-10-CM | POA: Diagnosis not present

## 2015-01-25 NOTE — Therapy (Signed)
[facility] [address] [place], [place], [address] Phone: [phone]   Fax:  [phone]  Physical Therapy Treatment  Patient Details  Name: Derek [name] MRN: [id number] Date of Birth: Jun 17, 1946 Referring Provider:  Corine Shelter, MD  Encounter Date: 01/25/2015      PT End of Session - 01/25/15 1627    Visit Number 2   Number of Visits 16   Date for PT Re-Evaluation 03/20/15   PT Start Time 1332   PT Stop Time 1415   PT Time Calculation (min) 43 min   Activity Tolerance Patient tolerated treatment well   Behavior During Therapy [facility] for tasks assessed/performed      Past Medical History  Diagnosis Date  . COPD (chronic obstructive pulmonary disease)   . Anxiety   . Arrhythmia   . Asthma   . Chronic headaches   . COPD (chronic obstructive pulmonary disease)   . Depression   . Diabetes   . Diverticulosis   . GERD (gastroesophageal reflux disease)   . Glaucoma   . Hyperlipidemia   . Hypertension   . IBS (irritable bowel syndrome)   . Pneumonia   . Sleep apnea     Past Surgical History  Procedure Laterality Date  . Rotator cuff repair  2008  . Lumbar spine surgery  2010    There were no vitals filed for this visit.  Visit Diagnosis:  Joint stiffness of spine  Muscle weakness      Subjective Assessment - 01/25/15 1617    Currently in Pain? Yes   Multiple Pain Sites No                         [facility] Adult PT Treatment/Exercise - 01/25/15 1355    Berg Balance Test   Sit to Stand Able to stand without using hands and stabilize independently   Standing Unsupported Able to stand safely 2 minutes   Sitting with Back Unsupported but Feet Supported on Floor or Stool Able to sit safely and securely 2 minutes   Stand to Sit Sits safely with minimal use of hands   Transfers Able to transfer safely, minor use of hands   Standing Unsupported with Eyes Closed Able to stand 10 seconds safely    Standing Ubsupported with Feet Together Able to place feet together independently and stand 1 minute safely   From Standing, Reach Forward with Outstretched Arm Can reach forward >12 cm safely (5")   From Standing Position, Pick up Object from Floor Able to pick up shoe, needs supervision   From Standing Position, Turn to Look Behind Over each Shoulder Looks behind one side only/other side shows less weight shift   Turn 360 Degrees Able to turn 360 degrees safely but slowly   Standing Unsupported, Alternately Place Feet on Step/Stool Able to stand independently and safely and complete 8 steps in 20 seconds   Standing Unsupported, One Foot in Front Able to plae foot ahead of the other independently and hold 30 seconds   Standing on One Leg Able to lift leg independently and hold 5-10 seconds   Total Score 49   Lumbar Exercises: Supine   Other Supine Lumbar Exercises Transversus Abdominis series added to home exercise 10 to 5 reps.  cues given.    patient did not feel they were challanging enough.                PT Education - 01/25/15 1626  Education provided Yes   Education Details Transversus abdominus , clam, march and slide   Person(s) Educated Patient   Methods Explanation;Tactile cues;Verbal cues;Handout   Comprehension Verbalized understanding;Returned demonstration;Need further instruction          PT Short Term Goals - 01/23/15 1750    PT SHORT TERM GOAL #1   Title The patient will have improved hip flexor muscle length and improved hip extension to 10 degrees bilaterally needed for walking in the community and park   Baseline 8/9   Time 4   Period Weeks   Status New   PT SHORT TERM GOAL #2   Title The patient will have improved lumbar AROM with flexion to 60 degrees, extension 20 degrees needed for greater ease getting in/out of the car   Time 4   Period Weeks   Status New   PT SHORT TERM GOAL #3   Title The patient will report and overall improvement in  pain and function 25%   Time 4   Period Weeks   Status New           PT Long Term Goals - 01/23/15 1753    PT LONG TERM GOAL #1   Title The patient will be independent in a HEP for further gains in ROM and strength needed for daily function   Baseline 9/6   Time 8   Period Weeks   Status New   PT LONG TERM GOAL #2   Title Abdominal and trunk extensor strength 4+/5 needed for standing/walking longer periods of time at the store and park   Time 8   Period Weeks   Status New   PT LONG TERM GOAL #3   Title Bilateral hip abduction strength improved to 4/5 needed for standing and walking longer periods of time with greater ease   Time 8   Period Weeks   Status New   PT LONG TERM GOAL #4   Title Patient will report overall improvement in pain and function at 50%   Time 8   Period Weeks   Status New   PT LONG TERM GOAL #5   Title FOTO functional outcome score improved from 57% limitation to 46% indicating improved function with less pain   Time 8   Period Weeks   Status New               Plan - 01/25/15 1720    Clinical Impression Statement Progress toward home exercise goals.  I get the impression he wants to work strenously.  I explained how he needs to start at the beginning and then progress toward more difficult tasks.  BERG 49/56.   PT Next Visit Plan Review  transversus abdominus exercises,  supine abdominal brace series and  prone multifidi series; stretch hip flexors bilaterally in sidelying or prone;  gluteus medius strengthening;modalities if needed   Consulted and Agree with Plan of Care Patient        Problem List Patient Active Problem List   Diagnosis Date Noted  . Leaky heart valve 05/12/2014  . GERD (gastroesophageal reflux disease) 10/11/2013  . Belching 10/11/2013  . Essential hypertension 03/16/2013  . Hyperlipidemia 03/16/2013  . Type 2 diabetes mellitus 03/07/2013    [facility]Shahan Starks 01/25/2015, 5:45 PM  [facility] [address] [place], [place], [address] Phone: [phone]   Fax:  [phone]     Liz BeachKaren Lang Zingg, PTA 01/25/2015 5:45 PM Phone: [phone] Fax: [phone]

## 2015-02-05 ENCOUNTER — Ambulatory Visit: Payer: Medicare Other | Admitting: Physical Therapy

## 2015-02-05 DIAGNOSIS — M5442 Lumbago with sciatica, left side: Secondary | ICD-10-CM

## 2015-02-05 DIAGNOSIS — M2560 Stiffness of unspecified joint, not elsewhere classified: Secondary | ICD-10-CM

## 2015-02-05 DIAGNOSIS — M6281 Muscle weakness (generalized): Secondary | ICD-10-CM

## 2015-02-05 NOTE — Therapy (Signed)
[facility] [address] [place], [place], [address] Phone: [phone]   Fax:  [phone]  Physical Therapy Treatment  Patient Details  Name: Derek [name] MRN: [id number] Date of Birth: [birth year] Referring Provider:  Corine Shelter, MD  Encounter Date: 02/05/2015      PT End of Session - 02/05/15 1540    Visit Number 3   Number of Visits 16   Date for PT Re-Evaluation 03/20/15   PT Start Time 0255   PT Stop Time 0340   PT Time Calculation (min) 45 min   Activity Tolerance Patient tolerated treatment well   Behavior During Therapy [facility] for tasks assessed/performed      Past Medical History  Diagnosis Date  . COPD (chronic obstructive pulmonary disease)   . Anxiety   . Arrhythmia   . Asthma   . Chronic headaches   . COPD (chronic obstructive pulmonary disease)   . Depression   . Diabetes   . Diverticulosis   . GERD (gastroesophageal reflux disease)   . Glaucoma   . Hyperlipidemia   . Hypertension   . IBS (irritable bowel syndrome)   . Pneumonia   . Sleep apnea     Past Surgical History  Procedure Laterality Date  . Rotator cuff repair  2008  . Lumbar spine surgery  2010    There were no vitals filed for this visit.  Visit Diagnosis:  Joint stiffness of spine  Muscle weakness  Bilateral low back pain with left-sided sciatica      Subjective Assessment - 02/05/15 1455    Subjective feeling good today, up and walking by myself   Currently in Pain? Yes   Pain Score 4    Pain Location Back   Pain Orientation Right;Left   Multiple Pain Sites No                         [facility] Adult PT Treatment/Exercise - 02/05/15 1500    Lumbar Exercises: Stretches   Active Hamstring Stretch 2 reps;20 seconds   Active Hamstring Stretch Limitations prone with sheet    Single Knee to Chest Stretch 2 reps;20 seconds   Lumbar Exercises: Supine   Bridge Compliant;15 reps;3 seconds   Other Supine  Lumbar Exercises Transversus Abdominis series 20 reps heel slides; 20 reps SLR  patient did not feel they were challanging enough.   Lumbar Exercises: Sidelying   Clam 10 reps;2 seconds   Clam Limitations red band   Lumbar Exercises: Prone   Other Prone Lumbar Exercises prone multifidi with hamstring curls x 10 each;                 PT Education - 02/05/15 1539    Education provided Yes   Education Details hip flexor stretch, knee to chest, clams   Person(s) Educated Patient   Methods Explanation;Demonstration;Handout   Comprehension Verbalized understanding;Returned demonstration          PT Short Term Goals - 01/23/15 1750    PT SHORT TERM GOAL #1   Title The patient will have improved hip flexor muscle length and improved hip extension to 10 degrees bilaterally needed for walking in the community and park   Baseline 8/9   Time 4   Period Weeks   Status New   PT SHORT TERM GOAL #2   Title The patient will have improved lumbar AROM with flexion to 60 degrees, extension 20 degrees needed for greater ease getting  in/out of the car   Time 4   Period Weeks   Status New   PT SHORT TERM GOAL #3   Title The patient will report and overall improvement in pain and function 25%   Time 4   Period Weeks   Status New           PT Long Term Goals - 01/23/15 1753    PT LONG TERM GOAL #1   Title The patient will be independent in a HEP for further gains in ROM and strength needed for daily function   Baseline 9/6   Time 8   Period Weeks   Status New   PT LONG TERM GOAL #2   Title Abdominal and trunk extensor strength 4+/5 needed for standing/walking longer periods of time at the store and park   Time 8   Period Weeks   Status New   PT LONG TERM GOAL #3   Title Bilateral hip abduction strength improved to 4/5 needed for standing and walking longer periods of time with greater ease   Time 8   Period Weeks   Status New   PT LONG TERM GOAL #4   Title Patient will  report overall improvement in pain and function at 50%   Time 8   Period Weeks   Status New   PT LONG TERM GOAL #5   Title FOTO functional outcome score improved from 57% limitation to 46% indicating improved function with less pain   Time 8   Period Weeks   Status New               Plan - 02/05/15 1541    Clinical Impression Statement pt. tolerated stretches and exercises well today; I with transverse abdominis with heel slides; progressed towards straight leg raises; basic bridges - clams could not hold; added hip flexor and single knee to chest stretch as well as BIL clams   PT Home Exercise Plan assess HEP; continue progression with TA exercises and bridging exercises; hip flexor and lumbar stretching; standing glute med exercise   Consulted and Agree with Plan of Care Patient     During this treatment session, the therapist was present, participating in and directing the treatment.   Problem List Patient Active Problem List   Diagnosis Date Noted  . Leaky heart valve 05/12/2014  . GERD (gastroesophageal reflux disease) 10/11/2013  . Belching 10/11/2013  . Essential hypertension 03/16/2013  . Hyperlipidemia 03/16/2013  . Type 2 diabetes mellitus 03/07/2013      Marin Comment, SPTA  02/05/2015 3:45 PM  Phone: [phone]  Fax: [phone]  Jannette Spanner, PTA 02/05/2015 4:31 PM Phone: [phone] Fax: [phone]   [facility] [address] [place], [place], [address] Phone: [phone]   Fax:  [phone]

## 2015-02-05 NOTE — Patient Instructions (Addendum)
Quads / HF, Prone   Lie face down, knees together. Grasp one ankle with same-side hand. Use towel if needed to reach. Gently pull foot toward buttock. Hold _20__ seconds. Repeat _2__ times per session. Do _2__ sessions per day.  Copyright  VHI. All rights reserved.   Hip Abduction: Side-Lying (Single Leg)   Lie on side with knees bent, tubing around thighs just above knees. Raise top leg, keeping knee bent. Repeat _10_ times per set. Repeat on other side. Do _2_ sets per session. Do _5_ sessions per week.  http://tub.exer.us/43   Copyright  VHI. All rights reserved.   Knee to Chest   Lying supine, bend  knee to chest. Hold 30 seconds . Repeat with other leg. Perform 3 times on each leg. Do 2__ times per day.  Copyright  VHI. All rights reserved.

## 2015-02-07 ENCOUNTER — Ambulatory Visit: Payer: Medicare Other | Admitting: Physical Therapy

## 2015-02-07 DIAGNOSIS — M2560 Stiffness of unspecified joint, not elsewhere classified: Secondary | ICD-10-CM

## 2015-02-07 DIAGNOSIS — M5442 Lumbago with sciatica, left side: Secondary | ICD-10-CM | POA: Diagnosis not present

## 2015-02-07 DIAGNOSIS — M6281 Muscle weakness (generalized): Secondary | ICD-10-CM

## 2015-02-07 NOTE — Therapy (Signed)
[facility] [address] [place], [place], [address] Phone: [phone]   Fax:  [phone]  Physical Therapy Treatment  Patient Details  Name: Derek [name] MRN: [id number] Date of Birth: 1945/09/13 Referring Provider:  Corine Shelter, MD  Encounter Date: 02/07/2015      PT End of Session - 02/07/15 1506    Visit Number 4   Number of Visits 16   Date for PT Re-Evaluation 03/20/15   Authorization Type [employer] Medicare G code and PT progress note at visit 10; 15th visit KX   PT Start Time 0300   PT Stop Time 0345   PT Time Calculation (min) 45 min      Past Medical History  Diagnosis Date  . COPD (chronic obstructive pulmonary disease)   . Anxiety   . Arrhythmia   . Asthma   . Chronic headaches   . COPD (chronic obstructive pulmonary disease)   . Depression   . Diabetes   . Diverticulosis   . GERD (gastroesophageal reflux disease)   . Glaucoma   . Hyperlipidemia   . Hypertension   . IBS (irritable bowel syndrome)   . Pneumonia   . Sleep apnea     Past Surgical History  Procedure Laterality Date  . Rotator cuff repair  2008  . Lumbar spine surgery  2010    There were no vitals filed for this visit.  Visit Diagnosis:  No diagnosis found.      Subjective Assessment - 02/07/15 1504    Subjective I'm here.    Currently in Pain? Yes   Pain Score 4    Pain Location Back   Pain Orientation Lower   Aggravating Factors  walking, sitting too long   Pain Relieving Factors stretch                         [facility] Adult PT Treatment/Exercise - 02/07/15 0001    Lumbar Exercises: Stretches   Single Knee to Chest Stretch 2 reps;30 seconds   Lower Trunk Rotation 5 reps;30 seconds   Lower Trunk Rotation Limitations upper trunk rotation book openings   Lumbar Exercises: Aerobic   Stationary Bike Nusep L3 x 5 min UE/LE   Lumbar Exercises: Supine   Bridge 10 reps;5 seconds   Other Supine Lumbar  Exercises Transversus Abdominis series 20 reps heel slides; 20 reps SLR, 20 reps marching   Lumbar Exercises: Sidelying   Clam 10 reps   Clam Limitations slow and contolled   Hip Abduction 10 reps   Hip Abduction Weights (lbs) cues for alignment                PT Education - 02/07/15 1545    Education provided Yes   Education Details Bridge ,  Ab set with [employer]) Educated Patient   Methods Explanation;Handout   Comprehension Verbalized understanding          PT Short Term Goals - 01/23/15 1750    PT SHORT TERM GOAL #1   Title The patient will have improved hip flexor muscle length and improved hip extension to 10 degrees bilaterally needed for walking in the community and park   Baseline 8/9   Time 4   Period Weeks   Status New   PT SHORT TERM GOAL #2   Title The patient will have improved lumbar AROM with flexion to 60 degrees, extension 20 degrees needed for greater ease getting in/out of  the car   Time 4   Period Weeks   Status New   PT SHORT TERM GOAL #3   Title The patient will report and overall improvement in pain and function 25%   Time 4   Period Weeks   Status New           PT Long Term Goals - 01/23/15 1753    PT LONG TERM GOAL #1   Title The patient will be independent in a HEP for further gains in ROM and strength needed for daily function   Baseline 9/6   Time 8   Period Weeks   Status New   PT LONG TERM GOAL #2   Title Abdominal and trunk extensor strength 4+/5 needed for standing/walking longer periods of time at the store and park   Time 8   Period Weeks   Status New   PT LONG TERM GOAL #3   Title Bilateral hip abduction strength improved to 4/5 needed for standing and walking longer periods of time with greater ease   Time 8   Period Weeks   Status New   PT LONG TERM GOAL #4   Title Patient will report overall improvement in pain and function at 50%   Time 8   Period Weeks   Status New   PT LONG TERM GOAL #5   Title  FOTO functional outcome score improved from 57% limitation to 46% indicating improved function with less pain   Time 8   Period Weeks   Status New               Plan - 02/07/15 1543    Clinical Impression Statement Pt reports difficulty with some of the exercises last visit. He like the Nustep today. Instructed pt in lumbar stretches hip abduction strengthening and core strengthening. Updated HEP. No c/o increased pan.    PT Next Visit Plan Review hip flexor stretch, progress as tolerated, check goals        Problem List Patient Active Problem List   Diagnosis Date Noted  . Leaky heart valve 05/12/2014  . GERD (gastroesophageal reflux disease) 10/11/2013  . Belching 10/11/2013  . Essential hypertension 03/16/2013  . Hyperlipidemia 03/16/2013  . Type 2 diabetes mellitus 03/07/2013    Sherrie Mustache , PTA  02/07/2015, 3:53 PM  [facility] [address] [place], [place], [address] Phone: [phone]   Fax:  [phone]

## 2015-02-07 NOTE — Patient Instructions (Signed)
  Straight Leg Raise:    Slide one leg down to straight. Lift up and down 10 times.  Be sure pelvis does not rock forward, tilt, rotate, or tip to side. Do _10__ times. Restabilize pelvis. Repeat with other leg. Do __1-2_ sets, __2_ times per day.  http://ss.exer.us/16   Copyright  VHI. All rights reserved.  Bridge   Lie back, legs bent. Inhale, pressing hips up. Keeping ribs in, lengthen lower back. Exhale, rolling down along spine from top. Repeat __10__ times. Do _2___ sessions per day.  Copyright  VHI. All rights reserved.

## 2015-02-12 ENCOUNTER — Ambulatory Visit: Payer: Medicare Other | Attending: Pulmonary Disease

## 2015-02-12 ENCOUNTER — Other Ambulatory Visit (INDEPENDENT_AMBULATORY_CARE_PROVIDER_SITE_OTHER): Payer: Medicare Other

## 2015-02-12 DIAGNOSIS — E119 Type 2 diabetes mellitus without complications: Secondary | ICD-10-CM

## 2015-02-12 DIAGNOSIS — M6281 Muscle weakness (generalized): Secondary | ICD-10-CM | POA: Diagnosis present

## 2015-02-12 DIAGNOSIS — M2560 Stiffness of unspecified joint, not elsewhere classified: Secondary | ICD-10-CM | POA: Diagnosis not present

## 2015-02-12 DIAGNOSIS — M5442 Lumbago with sciatica, left side: Secondary | ICD-10-CM | POA: Insufficient documentation

## 2015-02-12 LAB — COMPREHENSIVE METABOLIC PANEL
ALBUMIN: 4 g/dL (ref 3.5–5.2)
ALK PHOS: 83 U/L (ref 39–117)
ALT: 17 U/L (ref 0–53)
AST: 19 U/L (ref 0–37)
BUN: 11 mg/dL (ref 6–23)
CHLORIDE: 104 meq/L (ref 96–112)
CO2: 27 meq/L (ref 19–32)
CREATININE: 0.91 mg/dL (ref 0.40–1.50)
Calcium: 9.4 mg/dL (ref 8.4–10.5)
GFR: 105.81 mL/min (ref >60.00)
GLUCOSE: 136 mg/dL — AB (ref 70–99)
Potassium: 3.8 mEq/L (ref 3.5–5.1)
SODIUM: 140 meq/L (ref 135–145)
TOTAL PROTEIN: 7.1 g/dL (ref 6.0–8.3)
Total Bilirubin: 0.3 mg/dL (ref 0.2–1.2)

## 2015-02-12 LAB — HEMOGLOBIN A1C: HEMOGLOBIN A1C: 5.9 % (ref 4.6–6.5)

## 2015-02-12 NOTE — Therapy (Signed)
[place], [place], [address] Phone: [phone]   Fax:  [phone]  Physical Therapy Treatment  Patient Details  Name: Derek [name] MRN: [id number] Date of Birth: 07-Jan-1946 Referring Provider:  Vincente Liberty, MD  Encounter Date: 02/12/2015      PT End of Session - 02/12/15 1522    Visit Number 5   Number of Visits 16   Date for PT Re-Evaluation 03/20/15   PT Start Time 0300   PT Stop Time 0340   PT Time Calculation (min) 40 min   Activity Tolerance Patient tolerated treatment well   Behavior During Therapy [facility] for tasks assessed/performed      Past Medical History  Diagnosis Date  . COPD (chronic obstructive pulmonary disease)   . Anxiety   . Arrhythmia   . Asthma   . Chronic headaches   . COPD (chronic obstructive pulmonary disease)   . Depression   . Diabetes   . Diverticulosis   . GERD (gastroesophageal reflux disease)   . Glaucoma   . Hyperlipidemia   . Hypertension   . IBS (irritable bowel syndrome)   . Pneumonia   . Sleep apnea     Past Surgical History  Procedure Laterality Date  . Rotator cuff repair  2008  . Lumbar spine surgery  2010    There were no vitals filed for this visit.  Visit Diagnosis:  Joint stiffness of spine  Muscle weakness  Bilateral low back pain with left-sided sciatica      Subjective Assessment - 02/12/15 1458    Subjective A little better . lower pain level.  3/10   Currently in Pain? Yes   Pain Score 3    Pain Location Back   Pain Orientation Lower   Pain Descriptors / Indicators Sore   Pain Type Chronic pain   Pain Onset More than a month ago   Pain Frequency Constant   Aggravating Factors  Sitting for 2 hours, stanidng for couple of hours   Pain Relieving Factors stretch   Multiple Pain Sites No            [facility] PT Assessment - 02/12/15 1506    AROM   Overall AROM  --  10 degree hip ext passivve , active RT < 0  LT 5  degrees   Lumbar Flexion 65   Lumbar Extension 20   Strength   Right Hip Flexion 4+/5   Right Hip ABduction 4+/5   Left Hip Flexion 4+/5   Left Hip ABduction 4+/5                     [facility] Adult PT Treatment/Exercise - 02/12/15 1500    Lumbar Exercises: Aerobic   Stationary Bike Nusep L4 x 5 min UE/LE   Lumbar Exercises: Supine   Bridge 10 reps;3 seconds   Bridge Limitations both legs then x8 single leg bridge   Straight Leg Raise 10 reps;1 second  RT nd LT   Lumbar Exercises: Sidelying   Clam 10 reps  plus 2 RT and LT leg   Hip Abduction --  12 reps RT and LT. tactile cues for pelvic position   Lumbar Exercises: Prone   Straight Leg Raise 15 reps  RT and LT                  PT Short Term Goals - 02/12/15 1505    PT SHORT TERM GOAL #1   Baseline  Still < than 90 degrees, passively 10 degrees bilaterally   Status On-going   PT SHORT TERM GOAL #2   Title The patient will have improved lumbar AROM with flexion to 60 degrees, extension 20 degrees needed for greater ease getting in/out of the car   Baseline 65 degrees flexion , he was able to touch his feet   Status Achieved   PT SHORT TERM GOAL #3   Title The patient will report and overall improvement in pain and function 25%   Baseline He said his pain was improved but he reports 4/10 pain since eval   Status On-going           PT Long Term Goals - 01/23/15 1753    PT LONG TERM GOAL #1   Title The patient will be independent in a HEP for further gains in ROM and strength needed for daily function   Baseline 9/6   Time 8   Period Weeks   Status New   PT LONG TERM GOAL #2   Title Abdominal and trunk extensor strength 4+/5 needed for standing/walking longer periods of time at the store and park   Time 8   Period Weeks   Status New   PT LONG TERM GOAL #3   Title Bilateral hip abduction strength improved to 4/5 needed for standing and walking longer periods of time with greater ease   Time 8    Period Weeks   Status New   PT LONG TERM GOAL #4   Title Patient will report overall improvement in pain and function at 50%   Time 8   Period Weeks   Status New   PT LONG TERM GOAL #5   Title FOTO functional outcome score improved from 57% limitation to 46% indicating improved function with less pain   Time 8   Period Weeks   Status New               Plan - 02/12/15 1541    Clinical Impression Statement Derek [name] ended session with no pain. He needed cues on each exerciise but did all correct after cue. Strength improved   STG # 2-3 met   PT Next Visit Plan Continue LE and core strength   Consulted and Agree with Plan of Care Patient        Problem List Patient Active Problem List   Diagnosis Date Noted  . Leaky heart valve 05/12/2014  . GERD (gastroesophageal reflux disease) 10/11/2013  . Belching 10/11/2013  . Essential hypertension 03/16/2013  . Hyperlipidemia 03/16/2013  . Type 2 diabetes mellitus 03/07/2013    Darrel Hoover PT 02/12/2015, 3:43 PM  [facility] [address] [place], [place], [address] Phone: [phone]   Fax:  [phone]

## 2015-02-13 LAB — URINALYSIS, ROUTINE W REFLEX MICROSCOPIC
Bilirubin Urine: NEGATIVE
HGB URINE DIPSTICK: NEGATIVE
KETONES UR: NEGATIVE
Leukocytes, UA: NEGATIVE
Nitrite: NEGATIVE
PH: 7 (ref 5.0–8.0)
RBC / HPF: NONE SEEN (ref 0–?)
Specific Gravity, Urine: 1.015 (ref 1.000–1.030)
TOTAL PROTEIN, URINE-UPE24: NEGATIVE
URINE GLUCOSE: NEGATIVE
Urobilinogen, UA: 0.2 (ref 0.0–1.0)
WBC, UA: NONE SEEN (ref 0–?)

## 2015-02-15 ENCOUNTER — Ambulatory Visit (INDEPENDENT_AMBULATORY_CARE_PROVIDER_SITE_OTHER): Payer: Medicare Other | Admitting: Endocrinology

## 2015-02-15 ENCOUNTER — Encounter: Payer: Self-pay | Admitting: Endocrinology

## 2015-02-15 ENCOUNTER — Other Ambulatory Visit: Payer: Self-pay | Admitting: *Deleted

## 2015-02-15 VITALS — BP 120/68 | HR 87 | Temp 98.2°F | Resp 16 | Ht 69.5 in | Wt 182.8 lb

## 2015-02-15 DIAGNOSIS — E782 Mixed hyperlipidemia: Secondary | ICD-10-CM

## 2015-02-15 DIAGNOSIS — E119 Type 2 diabetes mellitus without complications: Secondary | ICD-10-CM | POA: Diagnosis not present

## 2015-02-15 MED ORDER — GLUCOSE BLOOD VI STRP: ORAL_STRIP | Status: DC

## 2015-02-15 NOTE — Patient Instructions (Signed)
Check blood sugars on waking up .Marland Kitchen1-2  .. times a week Also check blood sugars about 2 hours after a meal and do this after different meals by rotation  Recommended blood sugar levels on waking up is 90-130 and about 2 hours after meal is 120-150 Please bring blood sugar monitor to each visit.

## 2015-02-15 NOTE — Progress Notes (Signed)
Patient ID: Derek [name], male   DOB: April 26, 1946, 69 y.o.   MRN: [id number]           Reason for Appointment: Diabetes follow-up   History of Present Illness   Diagnosis: Type 2 DIABETES MELITUS, date of diagnosis: 2008      Past history: Although his glucose was 600 at diagnosis he was initially treated with metformin and subsequently had relatively milder diabetes Januvia was added to his regimen in 2011  He has had relatively mild diabetes which is consistently well controlled with regimen of Janumet 50/1000 daily He was told to get the 50/500 tablets from the [facility] and take 2 tablets but he did not do so and is getting the prescription from the drugstore Usually has upper normal A1c and this is slightly better He has checked blood sugar only very infrequently and has only one reading on his monitor despite reminders on his last visit He is still fairly good with his walking program  He thinks he is generally watching his diet also      Side effects from medications: None      Monitors blood glucose:  less than once a day.    Glucometer:  FreeStyle           Blood Glucose readings: has only one reading of 112 And his test strips are expired  Hypoglycemia: Never.              Physical activity: exercise: walking 3/7 days, 2 miles  usually            Dietician visit: Most recent: Unknown         Complications: are: None    Wt Readings from Last 3 Encounters:  02/15/15 182 lb 12.8 oz (82.918 kg)  12/08/14 178 lb 12.8 oz (81.103 kg)  11/09/14 179 lb (81.194 kg)   Lab Results  Component Value Date   HGBA1C 5.9 02/12/2015   HGBA1C 6.2 08/14/2014   HGBA1C 6.2 11/17/2013   Lab Results  Component Value Date   MICROALBUR 1.1 08/14/2014   LDLCALC 53 08/14/2014   CREATININE 0.91 02/12/2015    Appointment on 02/12/2015  Component Date Value Ref Range Status  . Hgb A1c MFr Bld 02/12/2015 5.9  4.6 - 6.5 % Final   Glycemic Control Guidelines for People with  Diabetes:Non Diabetic:  <6%Goal of Therapy: <7%Additional Action Suggested:  >8%   . Sodium 02/12/2015 140  135 - 145 mEq/L Final  . Potassium 02/12/2015 3.8  3.5 - 5.1 mEq/L Final  . Chloride 02/12/2015 104  96 - 112 mEq/L Final  . CO2 02/12/2015 27  19 - 32 mEq/L Final  . Glucose, Bld 02/12/2015 136* 70 - 99 mg/dL Final  . BUN 40/98/1191 11  6 - 23 mg/dL Final  . Creatinine, Ser 02/12/2015 0.91  0.40 - 1.50 mg/dL Final  . Total Bilirubin 02/12/2015 0.3  0.2 - 1.2 mg/dL Final  . Alkaline Phosphatase 02/12/2015 83  39 - 117 U/L Final  . AST 02/12/2015 19  0 - 37 U/L Final  . ALT 02/12/2015 17  0 - 53 U/L Final  . Total Protein 02/12/2015 7.1  6.0 - 8.3 g/dL Final  . Albumin 47/82/9562 4.0  3.5 - 5.2 g/dL Final  . Calcium 13/02/6577 9.4  8.4 - 10.5 mg/dL Final  . GFR 46/96/2952 105.81  >60.00 mL/min Final  . Color, Urine 02/12/2015 YELLOW  Yellow;Lt. Yellow Final  . APPearance 02/12/2015 CLEAR  Clear Final  .  Specific Gravity, Urine 02/12/2015 1.015  1.000-1.030 Final  . pH 02/12/2015 7.0  5.0 - 8.0 Final  . Total Protein, Urine 02/12/2015 NEGATIVE  Negative Final  . Urine Glucose 02/12/2015 NEGATIVE  Negative Final  . Ketones, ur 02/12/2015 NEGATIVE  Negative Final  . Bilirubin Urine 02/12/2015 NEGATIVE  Negative Final  . Hgb urine dipstick 02/12/2015 NEGATIVE  Negative Final  . Urobilinogen, UA 02/12/2015 0.2  0.0 - 1.0 Final  . Leukocytes, UA 02/12/2015 NEGATIVE  Negative Final  . Nitrite 02/12/2015 NEGATIVE  Negative Final  . WBC, UA 02/12/2015 none seen  0-2/hpf Final  . RBC / HPF 02/12/2015 none seen  0-2/hpf Final      Medication List       This list is accurate as of: 02/15/15 11:59 PM.  Always use your most recent med list.               aspirin 81 MG tablet  Take 81 mg by mouth daily.     LUMIGAN 0.01 % Soln  Generic drug:  bimatoprost     bimatoprost 0.03 % ophthalmic solution  Commonly known as:  LUMIGAN  1 drop at bedtime.     chlordiazePOXIDE 25 MG  capsule  Commonly known as:  LIBRIUM  Take 25 mg by mouth daily.     esomeprazole 20 MG capsule  Commonly known as:  NEXIUM  Take 20 mg by mouth 2 (two) times daily.     glucose blood test strip  Commonly known as:  FREESTYLE LITE  Use as instructed to check blood sugars ONE (1) time per day dx code E11.9     HYDROcodone-acetaminophen 5-325 MG per tablet  Commonly known as:  NORCO/VICODIN  Take 1 tablet by mouth every 6 (six) hours as needed for pain.     irbesartan-hydrochlorothiazide 150-12.5 MG per tablet  Commonly known as:  AVALIDE  Take 1 tablet by mouth daily.     JANUMET 50-1000 MG per tablet  Generic drug:  sitaGLIPtin-metformin  TAKE 1 TABLET BY MOUTH DAILY.     meclizine 25 MG tablet  Commonly known as:  ANTIVERT  Take 25 mg by mouth 3 (three) times daily.     metoprolol succinate 25 MG 24 hr tablet  Commonly known as:  TOPROL-XL  Take 1 tablet (25 mg total) by mouth daily.     Miconazole Nitrate 2 % Aerp  Apply to groin BID     potassium chloride 10 MEQ tablet  Commonly known as:  K-DUR,KLOR-CON  Take 10 mEq by mouth 2 (two) times daily.     ranitidine 150 MG capsule  Commonly known as:  ZANTAC  Take 150 mg by mouth 2 (two) times daily.     sertraline 100 MG tablet  Commonly known as:  ZOLOFT  Take 100 mg by mouth daily.     simvastatin 20 MG tablet  Commonly known as:  ZOCOR  Take 20 mg by mouth at bedtime.     simvastatin 40 MG tablet  Commonly known as:  ZOCOR  Take 40 mg by mouth every evening.     tamsulosin 0.4 MG Caps capsule  Commonly known as:  FLOMAX  Take 0.4 mg by mouth.     VENTOLIN HFA 108 (90 BASE) MCG/ACT inhaler  Generic drug:  albuterol     zolpidem 10 MG tablet  Commonly known as:  AMBIEN  Take 10 mg by mouth at bedtime as needed for sleep.  Allergies:  Allergies  Allergen Reactions  . Penicillins Rash    Past Medical History  Diagnosis Date  . COPD (chronic obstructive pulmonary disease)   . Anxiety     . Arrhythmia   . Asthma   . Chronic headaches   . COPD (chronic obstructive pulmonary disease)   . Depression   . Diabetes   . Diverticulosis   . GERD (gastroesophageal reflux disease)   . Glaucoma   . Hyperlipidemia   . Hypertension   . IBS (irritable bowel syndrome)   . Pneumonia   . Sleep apnea     Past Surgical History  Procedure Laterality Date  . Rotator cuff repair  2008  . Lumbar spine surgery  2010    Family History  Problem Relation Age of Onset  . Diabetes    . Arthritis Mother   . Hypertension Mother   . Heart failure Father     Social History:  reports that he has never smoked. He has never used smokeless tobacco. He reports that he does not drink alcohol or use illicit drugs.  Review of Systems:  HYPERTENSION:  appears well controlled with Avalide   On Toprol for  fast heart rate  HYPERLIPIDEMIA: The lipid abnormality consists of elevated LDL and low HDL treated with simvastatin; his HDL is improved .   Lab Results  Component Value Date   CHOL 133 08/14/2014   HDL 40.40 08/14/2014   LDLCALC 53 08/14/2014   TRIG 196.0* 08/14/2014   CHOLHDL 3 08/14/2014    Diabetic foot exam 2/16   Examination:   BP 120/68 mmHg  Pulse 87  Temp(Src) 98.2 F (36.8 C)  Resp 16  Ht 5' 9.5" (1.765 m)  Wt 182 lb 12.8 oz (82.918 kg)  BMI 26.62 kg/m2  SpO2 97%  Body mass index is 26.62 kg/(m^2).   No edema  ASSESSMENT/ PLAN:   Diabetes type 2   The patient's diabetes control appears to be  excellent again with near-normal blood sugars and upper normal in A1c  He is still fairly consistent with his diet and walking program Weight is slightly higher but he has a good BMI of 26 He has however not checked his blood sugars and discussed need to check them at least once a week  Also units to get new test strips He can continue the 50/1000 Janumet as he appears to have good control with this dose Encouraged him to continue watching his diet and regular  walking Reminded him about a Exams also  Hypertension: Well controlled     Aldo Sondgeroth 02/16/2015, 9:20 AM

## 2015-02-17 ENCOUNTER — Ambulatory Visit (INDEPENDENT_AMBULATORY_CARE_PROVIDER_SITE_OTHER): Payer: Medicare Other | Admitting: Physician Assistant

## 2015-02-17 VITALS — BP 120/70 | HR 68 | Temp 98.0°F | Resp 18 | Ht 69.0 in | Wt 181.5 lb

## 2015-02-17 DIAGNOSIS — H6121 Impacted cerumen, right ear: Secondary | ICD-10-CM

## 2015-02-17 NOTE — Patient Instructions (Signed)
Today we found that your loss of hearing was due to wax build-up.  If you feel like your ears are getting clogged with wax again, you can try to manage it at home using hydrogen peroxide and water - lay down on your side with your clogged ear facing the ceiling. Drop 3-4 drops of hydrogen peroxide in your ear and let it bubble up. Rinse the ear with water or get in the shower and allow the water to run over your ear. You can lightly dry your ear with a towel.  Avoid the use of Q-tips as much as possible, as they can push wax further into your ear canal.   Feel free to return to the office if this happens again or if you feel your symptoms have worsened.

## 2015-02-17 NOTE — Progress Notes (Signed)
Derek [name]  MRN: [id number] DOB: 03-08-1946  Subjective:  Pt presents to clinic with a PMH of vertigo presents for evaluation of ear discomfort. Pt reports a "sloshing sound" in his ear that started 5 days ago. He states that he stuffed klenex in the ear to try to dry it out. Only reports seeing ear wax on the tissue, no fluid or blood. He noticed diminished hearing in the right ear 3 days ago compared to left. Pt denies trauma to the area. Pt denies dizziness and nausea. Endorses mild tinnitus and occasional popping of the ear. Pt denies recent URI, fever, chills. Pt has year-round allergies and is not currently taking an antihistamine. Denies rhinorrhea, Itchy eyes, and cough. Pt denies jaw pain or trouble chewing.   Patient Active Problem List   Diagnosis Date Noted  . Leaky heart valve 05/12/2014  . GERD (gastroesophageal reflux disease) 10/11/2013  . Belching 10/11/2013  . Essential hypertension 03/16/2013  . Hyperlipidemia 03/16/2013  . Type 2 diabetes mellitus 03/07/2013    Current Outpatient Prescriptions on File Prior to Visit  Medication Sig Dispense Refill  . aspirin 81 MG tablet Take 81 mg by mouth daily.    . chlordiazePOXIDE (LIBRIUM) 25 MG capsule Take 25 mg by mouth daily.  1  . esomeprazole (NEXIUM) 20 MG capsule Take 20 mg by mouth 2 (two) times daily.  4  . glucose blood (FREESTYLE LITE) test strip Use as instructed to check blood sugars ONE (1) time per day dx code E11.9 100 each 3  . HYDROcodone-acetaminophen (NORCO/VICODIN) 5-325 MG per tablet Take 1 tablet by mouth every 6 (six) hours as needed for pain.    Marland Kitchen irbesartan-hydrochlorothiazide (AVALIDE) 150-12.5 MG per tablet Take 1 tablet by mouth daily.    Marland Kitchen JANUMET 50-1000 MG per tablet TAKE 1 TABLET BY MOUTH DAILY. 30 tablet 3  . LUMIGAN 0.01 % SOLN   11  . meclizine (ANTIVERT) 25 MG tablet Take 25 mg by mouth 3 (three) times daily.  4  . metoprolol succinate (TOPROL-XL) 25 MG 24 hr tablet Take 1 tablet (25 mg  total) by mouth daily. 90 tablet 3  . Miconazole Nitrate 2 % AERP Apply to groin BID 85 g 1  . potassium chloride (K-DUR,KLOR-CON) 10 MEQ tablet Take 10 mEq by mouth 2 (two) times daily.    . sertraline (ZOLOFT) 100 MG tablet Take 100 mg by mouth daily.    . simvastatin (ZOCOR) 20 MG tablet Take 20 mg by mouth at bedtime.  4  . simvastatin (ZOCOR) 40 MG tablet Take 40 mg by mouth every evening.    . tamsulosin (FLOMAX) 0.4 MG CAPS capsule Take 0.4 mg by mouth.    . VENTOLIN HFA 108 (90 BASE) MCG/ACT inhaler   4  . zolpidem (AMBIEN) 10 MG tablet Take 10 mg by mouth at bedtime as needed for sleep.     No current facility-administered medications on file prior to visit.    Allergies  Allergen Reactions  . Penicillins Rash    Review of Systems  HENT: Positive for hearing loss (mild right side decrease in hearing). Negative for ear pain.    Objective:  BP 120/70 mmHg  Pulse 68  Temp(Src) 98 F (36.7 C) (Oral)  Resp 18  Ht 5\' 9"  (1.753 m)  Wt 181 lb 8 oz (82.328 kg)  BMI 26.79 kg/m2  SpO2 97%  Physical Exam  Constitutional: He is oriented to person, place, and time and well-developed, well-nourished, and in  no distress.  HENT:  Head: Normocephalic and atraumatic.  Right Ear: Hearing, tympanic membrane and external ear normal. A foreign body (cerumen) is present.  Left Ear: Hearing, tympanic membrane, external ear and ear canal normal. No foreign bodies.  Cerumen removed with lavage. - ear canal was clear after lavage  Eyes: Conjunctivae are normal.  Neck: Normal range of motion.  Pulmonary/Chest: Effort normal.  Neurological: He is alert and oriented to person, place, and time. Gait normal.  Skin: Skin is warm and dry.  Psychiatric: Mood, memory, affect and judgment normal.    Assessment and Plan :  Ear build-up, right - Plan: Ear wax removal  Benny Lennert PA-C  [facility] 02/17/2015 3:32 PM

## 2015-02-17 NOTE — Progress Notes (Signed)
   Subjective:    Patient ID: Derek [name], male    DOB: Apr 15, 1946, 69 y.o.   MRN: [id number]  HPI Pt with a PMH of vertigo presents for evaluation of ear discomfort. Pt repots a "sloshing sound" in his ear that started 5 days ago. He states that he stuffed klenex in the ear to try to dry it out. Only reports seeing ear wax on the tissue, no fluid or blood. He noticed diminished hearing in the right ear 3 days ago compared to left. Pt denies trauma to the area. Pt denies dizziness and nausea. Endorses mild tinnitus and occasional popping of the ear. Pt denies recent URI, fever, chills. Pt has year-round allergies and is not currently taking an antihistamine. Denies rhinorrhea, Itchy eyes, and cough. Pt denies jaw pain or trouble chewing.  Review of Systems As above.    Objective:   Physical Exam  Constitutional: He appears well-developed and well-nourished. No distress.  BP 120/70 mmHg  Pulse 68  Temp(Src) 98 F (36.7 C) (Oral)  Resp 18  Ht  (1.753 m)  Wt 181 lb 8 oz (82.328 kg)  BMI 26.79 kg/m2  SpO2 97%   HENT:  Head: Normocephalic and atraumatic.  Left Ear: External ear normal.  Hearing is diminished on the right compared to the left. Ears nontender and nonerythematous BL. No lesions. Unable to visualize right TM due to cerumen impaction. Ear was irrigated with hydrogen peroxide and water and cerumen was expressed. TM visualized with appropriate cone of light and landmarks. Hearing improved.   Frontal and maxillary sinuses are nontender.  Eyes: Right eye exhibits no discharge. Left eye exhibits no discharge. No scleral icterus.  Neck: Normal range of motion. No tracheal deviation present. No thyromegaly present.  Lymphadenopathy:    He has no cervical adenopathy.  Skin: He is not diaphoretic.      Assessment & Plan:  Ear build-up, right - Plan: Ear wax removal

## 2015-02-19 ENCOUNTER — Ambulatory Visit: Payer: Medicare Other | Admitting: Physical Therapy

## 2015-02-19 DIAGNOSIS — M5442 Lumbago with sciatica, left side: Secondary | ICD-10-CM

## 2015-02-19 DIAGNOSIS — M2560 Stiffness of unspecified joint, not elsewhere classified: Secondary | ICD-10-CM | POA: Diagnosis not present

## 2015-02-19 DIAGNOSIS — M6281 Muscle weakness (generalized): Secondary | ICD-10-CM

## 2015-02-19 NOTE — Therapy (Signed)
[facility] [address] [place], [place], [address] Phone: [phone]   Fax:  [phone]  Physical Therapy Treatment  Patient Details  Name: Derek [name] MRN: [id number] Date of Birth: 1945-10-15 Referring Provider:  Corine Shelter, MD  Encounter Date: 02/19/2015      PT End of Session - 02/19/15 1420    Visit Number 6   Number of Visits 16   Date for PT Re-Evaluation 03/20/15   PT Start Time 1336   PT Stop Time 1418   PT Time Calculation (min) 42 min   Activity Tolerance Patient tolerated treatment well;No increased pain   Behavior During Therapy [facility] for tasks assessed/performed      Past Medical History  Diagnosis Date  . COPD (chronic obstructive pulmonary disease)   . Anxiety   . Arrhythmia   . Asthma   . Chronic headaches   . COPD (chronic obstructive pulmonary disease)   . Depression   . Diabetes   . Diverticulosis   . GERD (gastroesophageal reflux disease)   . Glaucoma   . Hyperlipidemia   . Hypertension   . IBS (irritable bowel syndrome)   . Pneumonia   . Sleep apnea     Past Surgical History  Procedure Laterality Date  . Rotator cuff repair  2008  . Lumbar spine surgery  2010    There were no vitals filed for this visit.  Visit Diagnosis:  Joint stiffness of spine  Muscle weakness  Bilateral low back pain with left-sided sciatica      Subjective Assessment - 02/19/15 1337    Subjective 3/10.  4/10 the most it has hurt since last Visit.  PT has helped him move a little easier.   Currently in Pain? Yes   Pain Score 3    Pain Location Back   Pain Orientation Lower   Pain Descriptors / Indicators --  Low back numb across low back   Pain Radiating Towards pain half way to knees , Lt foot numb.   Pain Frequency Intermittent   Aggravating Factors  sitting a long time   Pain Relieving Factors stretch   Multiple Pain Sites --  sometimes in neck, better with pillows,  Falling asleep with  head not aligned up right.                         [facility] Adult PT Treatment/Exercise - 02/19/15 1342    Lumbar Exercises: Stretches   Passive Hamstring Stretch 3 reps;30 seconds   Single Knee to Chest Stretch 3 reps;10 seconds  AA   Lumbar Exercises: Aerobic   Stationary Bike Nustep 5 minutes 15 seconds Level5.    Lumbar Exercises: Supine   Bridge 10 reps  both legs, 3 seconds   Bridge Limitations 1 leg each 8 reps.   Other Supine Lumbar Exercises Leg lengthener, leg press 5 second hold each   Lumbar Exercises: Sidelying   Clam 10 reps   Lumbar Exercises: Prone   Other Prone Lumbar Exercises multifitus pelvis press 10 reps, press with knee flexion 5 reps each.                  PT Short Term Goals - 02/19/15 1426    PT SHORT TERM GOAL #1   Title The patient will have improved hip flexor muscle length and improved hip extension to 10 degrees bilaterally needed for walking in the community and park   Time 4  Period Weeks   Status On-going   PT SHORT TERM GOAL #2   Title The patient will have improved lumbar AROM with flexion to 60 degrees, extension 20 degrees needed for greater ease getting in/out of the car   Time 4   Period Weeks   Status Unable to assess   PT SHORT TERM GOAL #3   Title The patient will report and overall improvement in pain and function 25%   Baseline 3/10 today, 4/10 yesterday   Time 4   Period Weeks   Status On-going           PT Long Term Goals - 01/23/15 1753    PT LONG TERM GOAL #1   Title The patient will be independent in a HEP for further gains in ROM and strength needed for daily function   Baseline 9/6   Time 8   Period Weeks   Status New   PT LONG TERM GOAL #2   Title Abdominal and trunk extensor strength 4+/5 needed for standing/walking longer periods of time at the store and park   Time 8   Period Weeks   Status New   PT LONG TERM GOAL #3   Title Bilateral hip abduction strength improved to 4/5 needed  for standing and walking longer periods of time with greater ease   Time 8   Period Weeks   Status New   PT LONG TERM GOAL #4   Title Patient will report overall improvement in pain and function at 50%   Time 8   Period Weeks   Status New   PT LONG TERM GOAL #5   Title FOTO functional outcome score improved from 57% limitation to 46% indicating improved function with less pain   Time 8   Period Weeks   Status New               Plan - 02/19/15 1421    Clinical Impression Statement pain 3/10 post exercise, unchanged.  Focus on mat stabilization and stretching. Less time required for transistions.     PT Next Visit Plan continue core stabilization, stretches, multifitus,  hip flexor stretching, colsed chain hip   Consulted and Agree with Plan of Care Patient        Problem List Patient Active Problem List   Diagnosis Date Noted  . Leaky heart valve 05/12/2014  . GERD (gastroesophageal reflux disease) 10/11/2013  . Belching 10/11/2013  . Essential hypertension 03/16/2013  . Hyperlipidemia 03/16/2013  . Type 2 diabetes mellitus 03/07/2013    [facility] 02/19/2015, 2:31 PM  [facility] [address] [place], [place], [address] Phone: [phone]   Fax:  [phone]

## 2015-02-22 ENCOUNTER — Ambulatory Visit: Payer: Medicare Other | Admitting: Physical Therapy

## 2015-02-22 DIAGNOSIS — M2560 Stiffness of unspecified joint, not elsewhere classified: Secondary | ICD-10-CM | POA: Diagnosis not present

## 2015-02-22 DIAGNOSIS — M6281 Muscle weakness (generalized): Secondary | ICD-10-CM

## 2015-02-22 DIAGNOSIS — M5442 Lumbago with sciatica, left side: Secondary | ICD-10-CM

## 2015-02-22 NOTE — Therapy (Signed)
[place], [place], [address] Phone: [phone]   Fax:  [phone]  Physical Therapy Treatment  Patient Details  Name: Derek [name] MRN: [id number] Date of Birth: 06-10-1946 Referring Provider:  Vincente Liberty, MD  Encounter Date: 02/22/2015      PT End of Session - 02/22/15 1425    Visit Number 7   Number of Visits 16   Date for PT Re-Evaluation 03/20/15   Authorization Type [employer] Medicare G code and PT progress note at visit 10; 15th visit KX   PT Start Time 1340   PT Stop Time 1420   PT Time Calculation (min) 40 min   Activity Tolerance Patient tolerated treatment well      Past Medical History  Diagnosis Date  . COPD (chronic obstructive pulmonary disease)   . Anxiety   . Arrhythmia   . Asthma   . Chronic headaches   . COPD (chronic obstructive pulmonary disease)   . Depression   . Diabetes   . Diverticulosis   . GERD (gastroesophageal reflux disease)   . Glaucoma   . Hyperlipidemia   . Hypertension   . IBS (irritable bowel syndrome)   . Pneumonia   . Sleep apnea     Past Surgical History  Procedure Laterality Date  . Rotator cuff repair  2008  . Lumbar spine surgery  2010    There were no vitals filed for this visit.  Visit Diagnosis:  Joint stiffness of spine  Muscle weakness  Bilateral low back pain with left-sided sciatica      Subjective Assessment - 02/22/15 1340    Subjective Patient arrives 8 min late.  States he is getting better.  The only thing that is not going well is his knees with walking uphill and downhill.    Currently in Pain? Yes   Pain Score 3    Pain Location Back   Pain Orientation Lower   Aggravating Factors  sitting aggravated   Pain Relieving Factors stretch, bend            [facility] PT Assessment - 02/22/15 1344    AROM   Lumbar Flexion 50   Lumbar Extension 20                     [facility] Adult PT Treatment/Exercise -  02/22/15 1346    Lumbar Exercises: Aerobic   Stationary Bike Nustep 7 minutes  Level5.    Lumbar Exercises: Standing   Row Strengthening;Both;10 reps;Theraband   Theraband Level (Row) Level 2 (Red)   Shoulder Extension Strengthening;Both;10 reps;Theraband   Theraband Level (Shoulder Extension) Level 2 (Red)   Shoulder ADduction Strengthening;Right;Left;15 reps;Theraband  Diagonal extensions    Other Standing Lumbar Exercises weight shift 4 directions 5x   Other Standing Lumbar Exercises Standing hip 4 ways 5x right and left   Lumbar Exercises: Supine   Ab Set 10 reps   Bridge 10 reps  LEs on green ball   Isometric Hip Flexion 10 reps   Other Supine Lumbar Exercises hip/knee lift with ab brace 8x                  PT Short Term Goals - 02/22/15 1431    PT SHORT TERM GOAL #1   Title The patient will have improved hip flexor muscle length and improved hip extension to 10 degrees bilaterally needed for walking in the community and park   Time 4   Period Weeks  Status On-going   PT SHORT TERM GOAL #2   Title The patient will have improved lumbar AROM with flexion to 60 degrees, extension 20 degrees needed for greater ease getting in/out of the car   Time 4   Period Weeks   Status Partially Met   PT SHORT TERM GOAL #3   Title The patient will report and overall improvement in pain and function 25%   Time 4   Period Weeks   Status On-going           PT Long Term Goals - 02/22/15 1432    PT LONG TERM GOAL #1   Title The patient will be independent in a HEP for further gains in ROM and strength needed for daily function   Baseline 9/6   Time 8   Period Weeks   Status On-going   PT LONG TERM GOAL #2   Title Abdominal and trunk extensor strength 4+/5 needed for standing/walking longer periods of time at the store and park   Time 8   Period Weeks   Status On-going   PT LONG TERM GOAL #3   Title Bilateral hip abduction strength improved to 4/5 needed for standing  and walking longer periods of time with greater ease   Time 8   Period Weeks   Status On-going   PT LONG TERM GOAL #4   Title Patient will report overall improvement in pain and function at 50%   Time 8   Period Weeks   PT LONG TERM GOAL #5   Title FOTO functional outcome score improved from 57% limitation to 46% indicating improved function with less pain   Time 8   Period Weeks   Status On-going               Plan - 02/22/15 1426    Clinical Impression Statement The patient reports no increase in pain with exercises today despite progression to more standing stabilzation.  Some mild discomfort in knees with standing hip 4 ways.  Cues needed for abdominal bracing in standing.  Patient reports his balance difficulty is mostly lateral as opposed to front or backward and most likely related to his neuropathy.  Progressing with lumbar ROM.     PT Next Visit Plan Core stabilization in standing; hip strengthening; weight shifting for lateral stability;  ?lateral step ups with close monitoring to avoid exacerbation of knee pain; hip flexor stretching;  Need progress note and G code at visit 10        Problem List Patient Active Problem List   Diagnosis Date Noted  . Leaky heart valve 05/12/2014  . GERD (gastroesophageal reflux disease) 10/11/2013  . Belching 10/11/2013  . Essential hypertension 03/16/2013  . Hyperlipidemia 03/16/2013  . Type 2 diabetes mellitus 03/07/2013    Alvera Singh 02/22/2015, 2:35 PM  [facility] [address] [place], [place], [address] Phone: [phone]   Fax:  [phone]  Ruben Im, PT 02/22/2015 2:35 PM Phone: [phone] Fax: [phone]

## 2015-02-26 ENCOUNTER — Ambulatory Visit: Payer: Medicare Other | Admitting: Physical Therapy

## 2015-02-26 DIAGNOSIS — M6281 Muscle weakness (generalized): Secondary | ICD-10-CM

## 2015-02-26 DIAGNOSIS — M2560 Stiffness of unspecified joint, not elsewhere classified: Secondary | ICD-10-CM

## 2015-02-26 NOTE — Therapy (Signed)
[place], [place], [address] Phone: [phone]   Fax:  [phone]  Physical Therapy Treatment  Patient Details  Name: Derek [name] MRN: [id number] Date of Birth: Aug 07, 1945 Referring Provider:  Vincente Liberty, MD  Encounter Date: 02/26/2015      PT End of Session - 02/26/15 1730    PT Start Time 2536   PT Stop Time 1505   PT Time Calculation (min) 45 min   Activity Tolerance Patient tolerated treatment well;No increased pain   Behavior During Therapy [facility] for tasks assessed/performed      Past Medical History  Diagnosis Date  . COPD (chronic obstructive pulmonary disease)   . Anxiety   . Arrhythmia   . Asthma   . Chronic headaches   . COPD (chronic obstructive pulmonary disease)   . Depression   . Diabetes   . Diverticulosis   . GERD (gastroesophageal reflux disease)   . Glaucoma   . Hyperlipidemia   . Hypertension   . IBS (irritable bowel syndrome)   . Pneumonia   . Sleep apnea     Past Surgical History  Procedure Laterality Date  . Rotator cuff repair  2008  . Lumbar spine surgery  2010    There were no vitals filed for this visit.  Visit Diagnosis:  Joint stiffness of spine  Muscle weakness      Subjective Assessment - 02/26/15 1727    Currently in Pain? Yes   Pain Score 3    Pain Location Knee   Pain Orientation Left;Right   Pain Descriptors / Indicators Aching   Pain Type Chronic pain   Pain Radiating Towards joint line both   Pain Frequency Intermittent   Aggravating Factors  walking up down hills steps   Pain Relieving Factors moving, stretches   Multiple Pain Sites No                         [facility] Adult PT Treatment/Exercise - 02/26/15 1417    Lumbar Exercises: Aerobic   Stationary Bike Nustep, 8 minutes L4   Lumbar Exercises: Standing   Heel Raises 10 reps   Other Standing Lumbar Exercises Hip extension 10 reps, with cues posture, March  in place with neutral spine 10 reps,    Lumbar Exercises: Supine   Other Supine Lumbar Exercises quad stretch with anterior hip stretch 3 reps 30 seconds each passive.   Lumbar Exercises: Sidelying   Clam 10 reps   Hip Abduction 10 reps                  PT Short Term Goals - 02/22/15 1431    PT SHORT TERM GOAL #1   Title The patient will have improved hip flexor muscle length and improved hip extension to 10 degrees bilaterally needed for walking in the community and park   Time 4   Period Weeks   Status On-going   PT SHORT TERM GOAL #2   Title The patient will have improved lumbar AROM with flexion to 60 degrees, extension 20 degrees needed for greater ease getting in/out of the car   Time 4   Period Weeks   Status Partially Met   PT SHORT TERM GOAL #3   Title The patient will report and overall improvement in pain and function 25%   Time 4   Period Weeks   Status On-going  PT Long Term Goals - 02/22/15 1432    PT LONG TERM GOAL #1   Title The patient will be independent in a HEP for further gains in ROM and strength needed for daily function   Baseline 9/6   Time 8   Period Weeks   Status On-going   PT LONG TERM GOAL #2   Title Abdominal and trunk extensor strength 4+/5 needed for standing/walking longer periods of time at the store and park   Time 8   Period Weeks   Status On-going   PT LONG TERM GOAL #3   Title Bilateral hip abduction strength improved to 4/5 needed for standing and walking longer periods of time with greater ease   Time 8   Period Weeks   Status On-going   PT LONG TERM GOAL #4   Title Patient will report overall improvement in pain and function at 50%   Time 8   Period Weeks   PT LONG TERM GOAL #5   Title FOTO functional outcome score improved from 57% limitation to 46% indicating improved function with less pain   Time 8   Period Weeks   Status On-going               Plan - 02/26/15 1730    Clinical  Impression Statement 2.5 pain in knee post session.  standing core helpful for back and legs.  Less guarding for transition movements.  Progress toward hip strength goal in clinic, not formlly tested   PT Next Visit Plan Core stabilization in standing; hip strengthening; weight shifting for lateral stability;  ?lateral step ups with close monitoring to avoid exacerbation of knee pain; hip flexor stretching;  Need progress note and G code at visit 10  Check specific goals.   Consulted and Agree with Plan of Care Patient        Problem List Patient Active Problem List   Diagnosis Date Noted  . Leaky heart valve 05/12/2014  . GERD (gastroesophageal reflux disease) 10/11/2013  . Belching 10/11/2013  . Essential hypertension 03/16/2013  . Hyperlipidemia 03/16/2013  . Type 2 diabetes mellitus 03/07/2013    [facility] 02/26/2015, 5:35 PM  [facility] [address] [place], [place], [address] Phone: [phone]   Fax:  [phone]     Melvenia Needles, PTA 02/26/2015 5:35 PM Phone: [phone] Fax: [phone]

## 2015-03-01 ENCOUNTER — Ambulatory Visit: Payer: Medicare Other | Admitting: Physical Therapy

## 2015-03-01 DIAGNOSIS — M6281 Muscle weakness (generalized): Secondary | ICD-10-CM

## 2015-03-01 DIAGNOSIS — M2560 Stiffness of unspecified joint, not elsewhere classified: Secondary | ICD-10-CM | POA: Diagnosis not present

## 2015-03-01 DIAGNOSIS — M5442 Lumbago with sciatica, left side: Secondary | ICD-10-CM

## 2015-03-01 NOTE — Therapy (Signed)
[place], [place], [address] Phone: [phone]   Fax:  [phone]  Physical Therapy Treatment  Patient Details  Name: Derek [name] MRN: [id number] Date of Birth: Aug 05, 1945 Referring Provider:  Vincente Liberty, MD  Encounter Date: 03/01/2015      PT End of Session - 03/01/15 1517    Visit Number 9   Number of Visits 16   Date for PT Re-Evaluation 03/20/15   PT Start Time 8563   PT Stop Time 1500   PT Time Calculation (min) 43 min   Activity Tolerance Patient tolerated treatment well;No increased pain      Past Medical History  Diagnosis Date  . COPD (chronic obstructive pulmonary disease)   . Anxiety   . Arrhythmia   . Asthma   . Chronic headaches   . COPD (chronic obstructive pulmonary disease)   . Depression   . Diabetes   . Diverticulosis   . GERD (gastroesophageal reflux disease)   . Glaucoma   . Hyperlipidemia   . Hypertension   . IBS (irritable bowel syndrome)   . Pneumonia   . Sleep apnea     Past Surgical History  Procedure Laterality Date  . Rotator cuff repair  2008  . Lumbar spine surgery  2010    There were no vitals filed for this visit.  Visit Diagnosis:  Joint stiffness of spine  Muscle weakness  Bilateral low back pain with left-sided sciatica      Subjective Assessment - 03/01/15 1417    Subjective 2.5/10 today.  30% pain improvement.   Currently in Pain? Yes   Pain Location Back                         [facility] Adult PT Treatment/Exercise - 03/01/15 1416    Lumbar Exercises: Stretches   Passive Hamstring Stretch 3 reps;30 seconds  sitting, monitored for technique   Standing Extension Limitations 30 degrees extension., flexion112 degrees, Lateral flexion, lateral flex Rt 15, Lt 22 degrees   Lumbar Exercises: Sidelying   Clam 10 reps  red band   Hip Abduction 10 reps  both   Hip Abduction Weights (lbs) LT 4+/5   Lumbar Exercises:  Prone   Straight Leg Raise 10 reps   Other Prone Lumbar Exercises anterior hip stretch 30 seconde 3 x ach   Knee/Hip Exercises: Standing   Lateral Step Up Both;1 set;10 reps;Hand Hold: 0;Step Height: 6"   Functional Squat 10 reps  cues.                  PT Short Term Goals - 03/01/15 1523    PT SHORT TERM GOAL #1   Title The patient will have improved hip flexor muscle length and improved hip extension to 10 degrees bilaterally needed for walking in the community and park   Baseline neutral prone   Time 4   Period Weeks   Status On-going   PT SHORT TERM GOAL #2   Title The patient will have improved lumbar AROM with flexion to 60 degrees, extension 20 degrees needed for greater ease getting in/out of the car   Baseline see flow sheet   Period Weeks   Status Partially Met   PT SHORT TERM GOAL #3   Title The patient will report and overall improvement in pain and function 25%   Baseline 30 %   Time 4   Period Weeks   Status Achieved  PT Long Term Goals - 02/22/15 1432    PT LONG TERM GOAL #1   Title The patient will be independent in a HEP for further gains in ROM and strength needed for daily function   Baseline 9/6   Time 8   Period Weeks   Status On-going   PT LONG TERM GOAL #2   Title Abdominal and trunk extensor strength 4+/5 needed for standing/walking longer periods of time at the store and park   Time 8   Period Weeks   Status On-going   PT LONG TERM GOAL #3   Title Bilateral hip abduction strength improved to 4/5 needed for standing and walking longer periods of time with greater ease   Time 8   Period Weeks   Status On-going   PT LONG TERM GOAL #4   Title Patient will report overall improvement in pain and function at 50%   Time 8   Period Weeks   PT LONG TERM GOAL #5   Title FOTO functional outcome score improved from 57% limitation to 46% indicating improved function with less pain   Time 8   Period Weeks   Status On-going                Plan - 03/01/15 1527    Clinical Impression Statement STG#3 met. did not flare up knees with step ups.    FOTO, G Code Next visit.    Problem List Patient Active Problem List   Diagnosis Date Noted  . Leaky heart valve 05/12/2014  . GERD (gastroesophageal reflux disease) 10/11/2013  . Belching 10/11/2013  . Essential hypertension 03/16/2013  . Hyperlipidemia 03/16/2013  . Type 2 diabetes mellitus 03/07/2013    [facility] 03/01/2015, 3:29 PM  [facility] [address] [place], [place], [address] Phone: [phone]   Fax:  [phone]     Melvenia Needles, PTA 03/01/2015 3:32 PM Phone: [phone] Fax: [phone]

## 2015-03-05 ENCOUNTER — Ambulatory Visit: Payer: Medicare Other | Admitting: Physical Therapy

## 2015-03-05 DIAGNOSIS — M5442 Lumbago with sciatica, left side: Secondary | ICD-10-CM

## 2015-03-05 DIAGNOSIS — M2560 Stiffness of unspecified joint, not elsewhere classified: Secondary | ICD-10-CM

## 2015-03-05 DIAGNOSIS — M6281 Muscle weakness (generalized): Secondary | ICD-10-CM

## 2015-03-05 NOTE — Therapy (Addendum)
[facility] [address] [place], [place], [address] Phone: [phone]   Fax:  [phone]  Physical Therapy Treatment  Patient Details  Name: Derek [name] MRN: [id number] Date of Birth: 1945-10-19 Referring Provider:  Corine Shelter, MD  Encounter Date: 03/05/2015      PT End of Session - 03/05/15 1803    Activity Tolerance Patient tolerated treatment well;No increased pain   Behavior During Therapy [facility] for tasks assessed/performed      Past Medical History  Diagnosis Date  . COPD (chronic obstructive pulmonary disease)   . Anxiety   . Arrhythmia   . Asthma   . Chronic headaches   . COPD (chronic obstructive pulmonary disease)   . Depression   . Diabetes   . Diverticulosis   . GERD (gastroesophageal reflux disease)   . Glaucoma   . Hyperlipidemia   . Hypertension   . IBS (irritable bowel syndrome)   . Pneumonia   . Sleep apnea     Past Surgical History  Procedure Laterality Date  . Rotator cuff repair  2008  . Lumbar spine surgery  2010    There were no vitals filed for this visit.  Visit Diagnosis:  Muscle weakness  Joint stiffness of spine  Bilateral low back pain with left-sided sciatica      Subjective Assessment - 03/05/15 1335    Subjective pain the same.  No questions.   Currently in Pain? Yes   Pain Score 2   2.5   Pain Location Back   Pain Orientation Right;Left   Pain Descriptors / Indicators Aching   Pain Type Chronic pain   Pain Frequency Intermittent   Aggravating Factors  walking up/down steps   Pain Relieving Factors movins stretches   Multiple Pain Sites No                         [facility] Adult PT Treatment/Exercise - 03/05/15 1338    Lumbar Exercises: Stretches   Double Knee to Chest Stretch --  10 reps feet on red ball.  Cued for abdominal work.   Lower Trunk Rotation --  legs on ball 10 reps pause 5 seconds   Lumbar Exercises: Supine   Bridge 10 reps   2 sets 1 on ball.red   Lumbar Exercises: Sidelying   Clam 10 reps  5 LBS at knee , Both   Lumbar Exercises: Prone   Other Prone Lumbar Exercises bent knee hip extension.  15X each.   Other Prone Lumbar Exercises 3 reps anterior hip stretch   1/2 bolster used to hold leg                  PT Short Term Goals - 03/05/15 1809    PT SHORT TERM GOAL #1   Title The patient will have improved hip flexor muscle length and improved hip extension to 10 degrees bilaterally needed for walking in the community and park   Baseline neutral prone   Time 4   Period Weeks   Status On-going   PT SHORT TERM GOAL #2   Title The patient will have improved lumbar AROM with flexion to 60 degrees, extension 20 degrees needed for greater ease getting in/out of the car   Time 4   Period Weeks   Status Unable to assess   PT SHORT TERM GOAL #3   Title The patient will report and overall improvement in pain and function 25%  Baseline 30 %   Time 4   Period Weeks   Status Achieved           PT Long Term Goals - 03/05/15 1810    PT LONG TERM GOAL #1   Title The patient will be independent in a HEP for further gains in ROM and strength needed for daily function   Baseline independent with exercises issued so fat.   Time 8   Period Weeks   Status On-going   PT LONG TERM GOAL #2   Title Abdominal and trunk extensor strength 4+/5 needed for standing/walking longer periods of time at the store and park   Baseline Abdominal strength 4+/%,  Extensor not tested.   Time 8   Period Weeks   Status Partially Met   PT LONG TERM GOAL #3   Title Bilateral hip abduction strength improved to 4/5 needed for standing and walking longer periods of time with greater ease   Baseline 4/5 - lifting 5 LBS in clinic   Time 8   Period Weeks   Status Achieved   PT LONG TERM GOAL #4   Title Patient will report overall improvement in pain and function at 50%   Time 8   Period Weeks   Status On-going   PT  LONG TERM GOAL #5   Title FOTO functional outcome score improved from 57% limitation to 46% indicating improved function with less pain   Baseline 50% limitation.   Time 8   Period Weeks   Status On-going               Plan - 03/05/15 1803    Clinical Impression Statement hips still at neutral extension, stretching continues.  Patient thinks this is about all he wil improve so wants 1 more session.  FOTO 50 % limitation improved from 57 % at intake.    PT Next Visit Plan 1 more visit , finalize home ex ercises.     Consulted and Agree with Plan of Care Patient        Problem List Patient Active Problem List   Diagnosis Date Noted  . Leaky heart valve 05/12/2014  . GERD (gastroesophageal reflux disease) 10/11/2013  . Belching 10/11/2013  . Essential hypertension 03/16/2013  . Hyperlipidemia 03/16/2013  . Type 2 diabetes mellitus 03/07/2013    [facility] 03/05/2015, 6:15 PM  [facility] [address] [place], [place], [address] Phone: [phone]   Fax:  [phone]     Melvenia Needles, PTA 03/05/2015 6:15 PM Phone: [phone] Fax: [phone] Physical Therapy Progress Note  Dates of Reporting Period: 01/23/15 to 03/05/15  Objective Reports of Subjective Statement: See above clinical impressions  Objective Measurements: Hip extension 0 degrees as above  Goal Update: Partial STGS and LTGs met  Plan: Discharge next visit to independent HEP for further ROM and strength improvements  Reason Skilled Services are Required: instruction in safe self progression of HEP with postural cues and monitoring of pain response, modifications as needed

## 2015-03-07 LAB — HM DIABETES EYE EXAM

## 2015-03-08 ENCOUNTER — Encounter: Payer: Self-pay | Admitting: *Deleted

## 2015-03-08 ENCOUNTER — Ambulatory Visit: Payer: Medicare Other | Admitting: Physical Therapy

## 2015-03-08 DIAGNOSIS — M6281 Muscle weakness (generalized): Secondary | ICD-10-CM

## 2015-03-08 DIAGNOSIS — M5442 Lumbago with sciatica, left side: Secondary | ICD-10-CM

## 2015-03-08 DIAGNOSIS — M2560 Stiffness of unspecified joint, not elsewhere classified: Secondary | ICD-10-CM | POA: Diagnosis not present

## 2015-03-08 NOTE — Patient Instructions (Signed)
Balance routine per handout:  Single leg standing  With UE support with weight shifting front/back, side/side, partial knee bends and hip hike 10x each-daily

## 2015-03-08 NOTE — Therapy (Signed)
[place], [place], [address] Phone: [phone]   Fax:  [phone]  Physical Therapy Treatment/Discharge summary  Patient Details  Name: Derek [name] MRN: [id number] Date of Birth: [birth year] Referring Provider:  Vincente Liberty, MD  Encounter Date: 03/08/2015      PT End of Session - 03/08/15 1352    Visit Number 11   Number of Visits 16   Date for PT Re-Evaluation 03/20/15   Authorization Type [employer] Medicare G code and PT progress note at visit 10; 15th visit KX   PT Start Time 1330   PT Stop Time 1411   PT Time Calculation (min) 41 min   Activity Tolerance Patient tolerated treatment well      Past Medical History  Diagnosis Date  . COPD (chronic obstructive pulmonary disease)   . Anxiety   . Arrhythmia   . Asthma   . Chronic headaches   . COPD (chronic obstructive pulmonary disease)   . Depression   . Diabetes   . Diverticulosis   . GERD (gastroesophageal reflux disease)   . Glaucoma   . Hyperlipidemia   . Hypertension   . IBS (irritable bowel syndrome)   . Pneumonia   . Sleep apnea     Past Surgical History  Procedure Laterality Date  . Rotator cuff repair  2008  . Lumbar spine surgery  2010    There were no vitals filed for this visit.  Visit Diagnosis:  Muscle weakness  Joint stiffness of spine  Bilateral low back pain with left-sided sciatica      Subjective Assessment - 03/08/15 1330    Subjective Current pain 2 07/15/08;  I think it's about as good as it is going to get.  My balance has worsened from side to side when I first get up for 8-10 feet but then gets better.     Currently in Pain? Yes   Pain Score 2    Pain Location Neck   Pain Orientation Right;Left   Pain Onset More than a month ago   Pain Frequency Intermittent   Pain Relieving Factors stretching exercises, go for a walk 3x/week for 1 mile            [facility] PT Assessment - 03/08/15 1337    Observation/Other Assessments   Focus on Therapeutic Outcomes (FOTO)  50% limit    AROM   Lumbar Flexion 80   Lumbar Extension 20   Lumbar - Right Side Bend 30   Lumbar - Left Side Bend 30   Strength   Right Hip Flexion 4+/5   Left Hip Flexion 4+/5   Left Hip ABduction 4+/5   Right Knee Flexion 4+/5   Right Knee Extension 4+/5   Left Knee Flexion 4+/5   Left Knee Extension 4+/5   Lumbar Flexion 4+/5   Lumbar Extension 4+/5                     [facility] Adult PT Treatment/Exercise - 03/08/15 1336    Lumbar Exercises: Aerobic   Stationary Bike Nu-Step L5 8 min   Lumbar Exercises: Standing   Other Standing Lumbar Exercises Standing balance routine 10x each:  weight shift front/back, side/side, partial knee bends and hip hikes single leg on all with UE support   Knee/Hip Exercises: Machines for Strengthening   Cybex Leg Press 40# B 20x; R/L 203 10x each  PT Education - 04-07-2015 1404    Education provided Yes   Education Details balance routine   Person(s) Educated Patient   Methods Explanation;Demonstration;Handout   Comprehension Verbalized understanding;Returned demonstration          PT Short Term Goals - 07-Apr-2015 1348    PT SHORT TERM GOAL #1   Title The patient will have improved hip flexor muscle length and improved hip extension to 10 degrees bilaterally needed for walking in the community and park   Status Achieved   PT SHORT TERM GOAL #2   Title The patient will have improved lumbar AROM with flexion to 60 degrees, extension 20 degrees needed for greater ease getting in/out of the car   Status Achieved   PT SHORT TERM GOAL #3   Title The patient will report and overall improvement in pain and function 25%   Status Achieved           PT Long Term Goals - 04/07/2015 1349    PT LONG TERM GOAL #1   Title The patient will be independent in a HEP for further gains in ROM and strength needed for daily function   Status Achieved    PT LONG TERM GOAL #2   Title Abdominal and trunk extensor strength 4+/5 needed for standing/walking longer periods of time at the store and park   Status Achieved   PT LONG TERM GOAL #3   Title Bilateral hip abduction strength improved to 4/5 needed for standing and walking longer periods of time with greater ease   Status Achieved   PT LONG TERM GOAL #4   Title Patient will report overall improvement in pain and function at 50%   Baseline 70%   Status Achieved   PT LONG TERM GOAL #5   Title FOTO functional outcome score improved from 57% limitation to 46% indicating improved function with less pain   Status Partially Met               Plan - April 07, 2015 1404    Clinical Impression Statement The patient has progressed well with ROM, pain reduction and strength gains.  He reports he is about 70% better overall and he feels he is better than his usual baseline level (history of chronic neck pain.)  He is independent with a home strengthening core and LEs and has been instructed in a balance ex routine to address balance deficits.  He has met the majority of short term and long term goals and the patient expresses readiness for discharge from PT at this time.            G-Codes - 04/07/15 1412    Functional Assessment Tool Used FOTO; clinical judgement   Functional Limitation Mobility: Walking and moving around   Mobility: Walking and Moving Around Goal Status [phone]) At least 40 percent but less than 60 percent impaired, limited or restricted   Mobility: Walking and Moving Around Discharge Status [phone]) At least 40 percent but less than 60 percent impaired, limited or restricted      Problem List Patient Active Problem List   Diagnosis Date Noted  . Leaky heart valve 05/12/2014  . GERD (gastroesophageal reflux disease) 10/11/2013  . Belching 10/11/2013  . Essential hypertension 03/16/2013  . Hyperlipidemia 03/16/2013  . Type 2 diabetes mellitus 04-06-13    Vivien Presto 2015-04-07, 2:13 PM  [facility] [address] [place], [place], [address] Phone: [phone]   Fax:  [phone]  PHYSICAL THERAPY DISCHARGE SUMMARY  Visits from Start of Care: 11  Current functional level related to goals / functional outcomes: See clinical impressions above   Remaining deficits: See above   Education / Equipment: Progressive HEP for strengthening and balance Plan: Patient agrees to discharge.  Patient goals were partially met. Patient is being discharged due to meeting the stated rehab goals.  ?????  Ruben Im, PT 03/08/2015 2:14 PM Phone: [phone] Fax: [phone]

## 2015-03-30 ENCOUNTER — Ambulatory Visit (INDEPENDENT_AMBULATORY_CARE_PROVIDER_SITE_OTHER): Payer: Medicare Other | Admitting: Podiatry

## 2015-03-30 ENCOUNTER — Encounter: Payer: Self-pay | Admitting: Podiatry

## 2015-03-30 DIAGNOSIS — M79676 Pain in unspecified toe(s): Secondary | ICD-10-CM

## 2015-03-30 DIAGNOSIS — M79675 Pain in left toe(s): Secondary | ICD-10-CM

## 2015-03-30 DIAGNOSIS — M79674 Pain in right toe(s): Secondary | ICD-10-CM | POA: Diagnosis not present

## 2015-03-30 DIAGNOSIS — E119 Type 2 diabetes mellitus without complications: Secondary | ICD-10-CM

## 2015-03-30 DIAGNOSIS — B351 Tinea unguium: Secondary | ICD-10-CM | POA: Diagnosis not present

## 2015-03-30 NOTE — Progress Notes (Signed)
Patient ID: Derek [name], male   DOB: 07/26/1945, 69 y.o.   MRN: [id number] Complaint:  Visit Type: Patient returns to my office for continued preventative foot care services. Complaint: Patient states" my nails have grown long and thick and become painful to walk and wear shoes" Patient has been diagnosed with DM with no complications. He presents for preventative foot care services. No changes to ROS  Podiatric Exam: Vascular: dorsalis pedis and posterior tibial pulses are palpable bilateral. Capillary return is immediate. Temperature gradient is WNL. Skin turgor WNL  Sensorium: Normal Semmes Weinstein monofilament test. Normal tactile sensation bilaterally. Nail Exam: Pt has thick disfigured discolored nails with subungual debris noted bilateral entire nail hallux through fifth toenails Ulcer Exam: There is no evidence of ulcer or pre-ulcerative changes or infection. Orthopedic Exam: Muscle tone and strength are WNL. No limitations in general ROM. No crepitus or effusions noted. Foot type and digits show no abnormalities. Bony prominences are unremarkable. Skin: No Porokeratosis. No infection or ulcers  Diagnosis:  Tinea unguium, Pain in right toe, pain in left toes  Treatment & Plan Procedures and Treatment: Consent by patient was obtained for treatment procedures. The patient understood the discussion of treatment and procedures well. All questions were answered thoroughly reviewed. Debridement of mycotic and hypertrophic toenails, 1 through 5 bilateral and clearing of subungual debris. No ulceration, no infection noted.  Return Visit-Office Procedure: Patient instructed to return to the office for a follow up visit 3 months for continued evaluation and treatment. 

## 2015-04-08 ENCOUNTER — Ambulatory Visit (INDEPENDENT_AMBULATORY_CARE_PROVIDER_SITE_OTHER): Payer: Medicare Other | Admitting: Physician Assistant

## 2015-04-08 VITALS — BP 128/80 | HR 77 | Temp 98.8°F | Resp 16 | Ht 69.0 in | Wt 178.6 lb

## 2015-04-08 DIAGNOSIS — L309 Dermatitis, unspecified: Secondary | ICD-10-CM

## 2015-04-08 MED ORDER — TRIAMCINOLONE ACETONIDE 0.1 % EX OINT: 1.0000 "application " | TOPICAL_OINTMENT | Freq: Two times a day (BID) | CUTANEOUS | Status: AC

## 2015-04-08 NOTE — Patient Instructions (Signed)
If you have any concern of itching, inflammation, redness, or bleeding please return to the clinic. Do not use the ointment longer than 2 weeks at any time.

## 2015-04-13 NOTE — Progress Notes (Signed)
[facility] [address] [place] [address] [phone]  Date:  04/08/2015   Name:  Derek [name]   DOB:  1945-09-21   MRN:  [id number]  PCP:  Eino Farber, MD    History of Present Illness:  Derek [name] is a 69 y.o. male patient who presents to [facility] for refill of his triamcinolone ointment.  He uses this at this groin area with pruritic sensation and itching.  He has not noticed inflammation, redness, bleeding, or rash.  He notes that he has a hx of tinea cruris.  He does note dryness in the area.  It has calmed down, but he is concerned that if it sparks he may not have any, and [facility].  He may use 2-3 days, and discontinue with resolve of sxs.       Patient Active Problem List   Diagnosis Date Noted  . Leaky heart valve 05/12/2014  . GERD (gastroesophageal reflux disease) 10/11/2013  . Belching 10/11/2013  . Essential hypertension 03/16/2013  . Hyperlipidemia 03/16/2013  . Type 2 diabetes mellitus 03/07/2013    Past Medical History  Diagnosis Date  . COPD (chronic obstructive pulmonary disease)   . Anxiety   . Arrhythmia   . Asthma   . Chronic headaches   . COPD (chronic obstructive pulmonary disease)   . Depression   . Diabetes   . Diverticulosis   . GERD (gastroesophageal reflux disease)   . Glaucoma   . Hyperlipidemia   . Hypertension   . IBS (irritable bowel syndrome)   . Pneumonia   . Sleep apnea     Past Surgical History  Procedure Laterality Date  . Rotator cuff repair  2008  . Lumbar spine surgery  2010    Social History  Substance Use Topics  . Smoking status: Never Smoker   . Smokeless tobacco: Never Used  . Alcohol Use: No    Family History  Problem Relation Age of Onset  . Diabetes    . Arthritis Mother   . Hypertension Mother   . Heart failure Father     Allergies  Allergen Reactions  . Penicillins Rash    Medication list has been reviewed and updated.  Current Outpatient  Prescriptions on File Prior to Visit  Medication Sig Dispense Refill  . aspirin 81 MG tablet Take 81 mg by mouth daily.    . chlordiazePOXIDE (LIBRIUM) 25 MG capsule Take 25 mg by mouth daily.  1  . esomeprazole (NEXIUM) 20 MG capsule Take 20 mg by mouth 2 (two) times daily.  4  . glucose blood (FREESTYLE LITE) test strip Use as instructed to check blood sugars ONE (1) time per day dx code E11.9 100 each 3  . HYDROcodone-acetaminophen (NORCO/VICODIN) 5-325 MG per tablet Take 1 tablet by mouth every 6 (six) hours as needed for pain.    Marland Kitchen irbesartan-hydrochlorothiazide (AVALIDE) 150-12.5 MG per tablet Take 1 tablet by mouth daily.    Marland Kitchen JANUMET 50-1000 MG per tablet TAKE 1 TABLET BY MOUTH DAILY. 30 tablet 3  . LUMIGAN 0.01 % SOLN   11  . meclizine (ANTIVERT) 25 MG tablet Take 25 mg by mouth 3 (three) times daily.  4  . metoprolol succinate (TOPROL-XL) 25 MG 24 hr tablet Take 1 tablet (25 mg total) by mouth daily. 90 tablet 3  . potassium chloride (K-DUR,KLOR-CON) 10 MEQ tablet Take 10 mEq by mouth 2 (two) times daily.    Marland Kitchen  sertraline (ZOLOFT) 100 MG tablet Take 100 mg by mouth daily.    . simvastatin (ZOCOR) 20 MG tablet Take 20 mg by mouth at bedtime.  4  . simvastatin (ZOCOR) 40 MG tablet Take 40 mg by mouth every evening.    . tamsulosin (FLOMAX) 0.4 MG CAPS capsule Take 0.4 mg by mouth.    . VENTOLIN HFA 108 (90 BASE) MCG/ACT inhaler   4  . zolpidem (AMBIEN) 10 MG tablet Take 10 mg by mouth at bedtime as needed for sleep.     No current facility-administered medications on file prior to visit.    ROS ROS otherwise unremarkable unless listed above.   Physical Examination: BP 128/80 mmHg  Pulse 77  Temp(Src) 98.8 F (37.1 C) (Oral)  Resp 16  Ht  (1.753 m)  Wt 178 lb 9.6 oz (81.012 kg)  BMI 26.36 kg/m2  SpO2 98% Ideal Body Weight: Weight in (lb) to have BMI = 25: 168.9  Physical Exam  Constitutional: He is oriented to person, place, and time. He appears well-developed and  well-nourished. No distress.  HENT:  Head: Normocephalic and atraumatic.  Eyes: Conjunctivae and EOM are normal. Pupils are equal, round, and reactive to light.  Cardiovascular: Normal rate.   Pulmonary/Chest: Effort normal. No respiratory distress.  Genitourinary: Testes normal and penis normal.  Neurological: He is alert and oriented to person, place, and time.  Skin: Skin is warm and dry. He is not diaphoretic.  Psychiatric: He has a normal mood and affect. His behavior is normal.     Assessment and Plan: 69 year old male with pmh listed above is here today for intermittent rash.  Advised to use intermittently as he has in the past.  It is difficult to see where the rash outbreak occurs, but appears to resolve with 2-3 applications of triamcinolone.  Will provide small refill, and [facility] [place] if outbreak reoccurs.  1. Dermatitis - triamcinolone ointment (KENALOG) 0.1 %; Apply 1 application topically 2 (two) times daily.  Dispense: 30 g; Refill: 0   Trena Platt, PA-C [facility] 04/13/2015 3:14 PM

## 2015-05-07 ENCOUNTER — Other Ambulatory Visit: Payer: Self-pay | Admitting: Endocrinology

## 2015-06-28 ENCOUNTER — Encounter: Payer: Self-pay | Admitting: Podiatry

## 2015-06-28 ENCOUNTER — Ambulatory Visit (INDEPENDENT_AMBULATORY_CARE_PROVIDER_SITE_OTHER): Payer: Medicare Other | Admitting: Podiatry

## 2015-06-28 DIAGNOSIS — M79675 Pain in left toe(s): Secondary | ICD-10-CM

## 2015-06-28 DIAGNOSIS — M79674 Pain in right toe(s): Secondary | ICD-10-CM

## 2015-06-28 DIAGNOSIS — M79676 Pain in unspecified toe(s): Secondary | ICD-10-CM | POA: Diagnosis not present

## 2015-06-28 DIAGNOSIS — E119 Type 2 diabetes mellitus without complications: Secondary | ICD-10-CM

## 2015-06-28 DIAGNOSIS — B351 Tinea unguium: Secondary | ICD-10-CM

## 2015-06-28 NOTE — Progress Notes (Signed)
Patient ID: Derek BoatmanFred A [name], male   DOB: June 22, 1946, 69 y.o.   MRN: [id number] Complaint:  Visit Type: Patient returns to my office for continued preventative foot care services. Complaint: Patient states" my nails have grown long and thick and become painful to walk and wear shoes" Patient has been diagnosed with DM with no complications. He presents for preventative foot care services. No changes to ROS  Podiatric Exam: Vascular: dorsalis pedis and posterior tibial pulses are palpable bilateral. Capillary return is immediate. Temperature gradient is WNL. Skin turgor WNL  Sensorium: Normal Semmes Weinstein monofilament test. Normal tactile sensation bilaterally. Nail Exam: Pt has thick disfigured discolored nails with subungual debris noted bilateral entire nail hallux through fifth toenails Ulcer Exam: There is no evidence of ulcer or pre-ulcerative changes or infection. Orthopedic Exam: Muscle tone and strength are WNL. No limitations in general ROM. No crepitus or effusions noted. Foot type and digits show no abnormalities. Bony prominences are unremarkable. Skin: No Porokeratosis. No infection or ulcers  Diagnosis:  Tinea unguium, Pain in right toe, pain in left toes  Treatment & Plan Procedures and Treatment: Consent by patient was obtained for treatment procedures. The patient understood the discussion of treatment and procedures well. All questions were answered thoroughly reviewed. Debridement of mycotic and hypertrophic toenails, 1 through 5 bilateral and clearing of subungual debris. No ulceration, no infection noted.  Return Visit-Office Procedure: Patient instructed to return to the office for a follow up visit 3 months for continued evaluation and treatment.   Derek [name] DPM

## 2015-08-13 ENCOUNTER — Other Ambulatory Visit (INDEPENDENT_AMBULATORY_CARE_PROVIDER_SITE_OTHER): Payer: Medicare Other

## 2015-08-13 DIAGNOSIS — E119 Type 2 diabetes mellitus without complications: Secondary | ICD-10-CM | POA: Diagnosis not present

## 2015-08-13 LAB — BASIC METABOLIC PANEL
BUN: 10 mg/dL (ref 6–23)
CHLORIDE: 104 meq/L (ref 96–112)
CO2: 27 mEq/L (ref 19–32)
CREATININE: 0.87 mg/dL (ref 0.40–1.50)
Calcium: 9.2 mg/dL (ref 8.4–10.5)
GFR: 111.28 mL/min (ref >60.00)
Glucose, Bld: 100 mg/dL — ABNORMAL HIGH (ref 70–99)
Potassium: 3.8 mEq/L (ref 3.5–5.1)
Sodium: 138 mEq/L (ref 135–145)

## 2015-08-13 LAB — HEMOGLOBIN A1C: Hgb A1c MFr Bld: 6.1 % (ref 4.6–6.5)

## 2015-08-17 ENCOUNTER — Encounter: Payer: Self-pay | Admitting: Endocrinology

## 2015-08-17 ENCOUNTER — Ambulatory Visit (INDEPENDENT_AMBULATORY_CARE_PROVIDER_SITE_OTHER): Payer: Medicare Other | Admitting: Endocrinology

## 2015-08-17 VITALS — BP 114/72 | HR 84 | Temp 98.8°F | Resp 14 | Ht 69.0 in | Wt 181.4 lb

## 2015-08-17 DIAGNOSIS — Z23 Encounter for immunization: Secondary | ICD-10-CM

## 2015-08-17 DIAGNOSIS — E119 Type 2 diabetes mellitus without complications: Secondary | ICD-10-CM

## 2015-08-17 LAB — MICROALBUMIN / CREATININE URINE RATIO
CREATININE, U: 144.4 mg/dL
MICROALB/CREAT RATIO: 0.5 mg/g (ref 0.0–30.0)
Microalb, Ur: 0.7 mg/dL (ref 0.0–1.9)

## 2015-08-17 LAB — POCT URINALYSIS DIPSTICK
BILIRUBIN UA: NEGATIVE
Blood, UA: NEGATIVE
Glucose, UA: NEGATIVE
KETONES UA: NEGATIVE
LEUKOCYTES UA: NEGATIVE
NITRITE UA: NEGATIVE
Protein, UA: NEGATIVE
Spec Grav, UA: 1.02
Urobilinogen, UA: 0.2
pH, UA: 6

## 2015-08-17 NOTE — Progress Notes (Signed)
Patient ID: Derek [name], male   DOB: Nov 22, 1945, 70 y.o.   MRN: [id number]           Reason for Appointment: Diabetes follow-up   History of Present Illness   Diagnosis: Type 2 DIABETES MELITUS, date of diagnosis: 2008      Past history: Although his glucose was 600 at diagnosis he was initially treated with metformin and subsequently had relatively milder diabetes Januvia was added to his regimen in 2011  He has had relatively mild diabetes which is consistently well controlled with regimen of Janumet 50/1000 daily Usually has upper normal A1c consistently He has checked blood sugar somewhat more often on this visit and also at different times of the day including after evening meal  He is still fairly good with his walking program  He thinks he is generally watching his diet also      Side effects from medications: None      Monitors blood glucose:  less than once a day.    Glucometer:  FreeStyle           Blood Glucose readings: Range 61-157 AVERAGE blood sugar 123 and the mornings and lower in the afternoons and evenings HYPOGLYCEMIA: He denies any symptoms with his reading of 61 at about 12 noon  Physical activity: exercise: walking 3/7 days, 2 miles  usually weather permitting            Dietician visit: Most recent: Unknown         Complications: are: None    Wt Readings from Last 3 Encounters:  08/17/15 181 lb 6.4 oz (82.283 kg)  04/08/15 178 lb 9.6 oz (81.012 kg)  02/17/15 181 lb 8 oz (82.328 kg)   Lab Results  Component Value Date   HGBA1C 6.1 08/13/2015   HGBA1C 5.9 02/12/2015   HGBA1C 6.2 08/14/2014   Lab Results  Component Value Date   MICROALBUR 1.1 08/14/2014   LDLCALC 53 08/14/2014   CREATININE 0.87 08/13/2015    Lab on 08/13/2015  Component Date Value Ref Range Status  . Hgb A1c MFr Bld 08/13/2015 6.1  4.6 - 6.5 % Final   Glycemic Control Guidelines for People with Diabetes:Non Diabetic:  <6%Goal of Therapy: <7%Additional Action Suggested:  >8%   .  Sodium 08/13/2015 138  135 - 145 mEq/L Final  . Potassium 08/13/2015 3.8  3.5 - 5.1 mEq/L Final  . Chloride 08/13/2015 104  96 - 112 mEq/L Final  . CO2 08/13/2015 27  19 - 32 mEq/L Final  . Glucose, Bld 08/13/2015 100* 70 - 99 mg/dL Final  . BUN 47/42/5956 10  6 - 23 mg/dL Final  . Creatinine, Ser 08/13/2015 0.87  0.40 - 1.50 mg/dL Final  . Calcium 38/75/6433 9.2  8.4 - 10.5 mg/dL Final  . GFR 29/51/8841 111.28  >60.00 mL/min Final      Medication List       This list is accurate as of: 08/17/15  1:15 PM.  Always use your most recent med list.               aspirin 81 MG tablet  Take 81 mg by mouth daily.     chlordiazePOXIDE 25 MG capsule  Commonly known as:  LIBRIUM  Take 25 mg by mouth daily.     esomeprazole 20 MG capsule  Commonly known as:  NEXIUM  Take 20 mg by mouth 2 (two) times daily.     glucose blood test strip  Commonly known as:  FREESTYLE LITE  Use as instructed to check blood sugars ONE (1) time per day dx code E11.9     HYDROcodone-acetaminophen 5-325 MG tablet  Commonly known as:  NORCO/VICODIN  Take 1 tablet by mouth every 6 (six) hours as needed for pain.     irbesartan-hydrochlorothiazide 150-12.5 MG tablet  Commonly known as:  AVALIDE  Take 1 tablet by mouth daily.     JANUMET 50-1000 MG tablet  Generic drug:  sitaGLIPtin-metformin  TAKE 1 TABLET BY MOUTH DAILY.     LUMIGAN 0.01 % Soln  Generic drug:  bimatoprost     meclizine 25 MG tablet  Commonly known as:  ANTIVERT  Take 25 mg by mouth 3 (three) times daily.     metoprolol succinate 25 MG 24 hr tablet  Commonly known as:  TOPROL-XL  Take 1 tablet (25 mg total) by mouth daily.     potassium chloride 10 MEQ tablet  Commonly known as:  K-DUR,KLOR-CON  Take 10 mEq by mouth 2 (two) times daily.     sertraline 100 MG tablet  Commonly known as:  ZOLOFT  Take 100 mg by mouth daily.     simvastatin 20 MG tablet  Commonly known as:  ZOCOR  Take 20 mg by mouth at bedtime.      simvastatin 40 MG tablet  Commonly known as:  ZOCOR  Take 40 mg by mouth every evening.     tamsulosin 0.4 MG Caps capsule  Commonly known as:  FLOMAX  Take 0.4 mg by mouth.     triamcinolone ointment 0.1 %  Commonly known as:  KENALOG  Apply 1 application topically 2 (two) times daily.     VENTOLIN HFA 108 (90 Base) MCG/ACT inhaler  Generic drug:  albuterol     zolpidem 10 MG tablet  Commonly known as:  AMBIEN  Take 10 mg by mouth at bedtime as needed for sleep.        Allergies:  Allergies  Allergen Reactions  . Penicillins Rash    Past Medical History  Diagnosis Date  . COPD (chronic obstructive pulmonary disease) (HCC)   . Anxiety   . Arrhythmia   . Asthma   . Chronic headaches   . COPD (chronic obstructive pulmonary disease) (HCC)   . Depression   . Diabetes (HCC)   . Diverticulosis   . GERD (gastroesophageal reflux disease)   . Glaucoma   . Hyperlipidemia   . Hypertension   . IBS (irritable bowel syndrome)   . Pneumonia   . Sleep apnea     Past Surgical History  Procedure Laterality Date  . Rotator cuff repair  2008  . Lumbar spine surgery  2010    Family History  Problem Relation Age of Onset  . Diabetes    . Arthritis Mother   . Hypertension Mother   . Heart failure Father     Social History:  reports that he has never smoked. He has never used smokeless tobacco. He reports that he does not drink alcohol or use illicit drugs.  Review of Systems:  HYPERTENSION:   well controlled with Avalide   On Toprol for  fast heart rate  HYPERLIPIDEMIA: The lipid abnormality consists of elevated LDL and low HDL treated with simvastatin 40; his HDL is 40 .   Lab Results  Component Value Date   CHOL 133 08/14/2014   HDL 40.40 08/14/2014   LDLCALC 53 08/14/2014   TRIG 196.0* 08/14/2014   CHOLHDL 3 08/14/2014  Diabetic foot exam 2/17   Examination:   BP 114/72 mmHg  Pulse 84  Temp(Src) 98.8 F (37.1 C)  Resp 14  Ht 5\' 9"  (1.753 m)  Wt  181 lb 6.4 oz (82.283 kg)  BMI 26.78 kg/m2  SpO2 98%  Body mass index is 26.78 kg/(m^2).   No edema  Diabetic Foot Exam - Simple   Simple Foot Form  Diabetic Foot exam was performed with the following findings:  Yes 08/17/2015  1:21 PM  Visual Inspection  No deformities, no ulcerations, no other skin breakdown bilaterally:  Yes  Sensation Testing  Intact to touch and monofilament testing bilaterally:  Yes  Pulse Check  Posterior Tibialis and Dorsalis pulse intact bilaterally:  Yes  Comments  Toenail fungus+       ASSESSMENT/ PLAN:   Diabetes type 2   The patient's diabetes control appears to be  excellent again with near-normal home blood sugars and upper normal in A1c Fasting glucose appear to be relatively highat home, postprandial readings are not high He is still fairly consistent with his diet and walking for exercise  He can continue the 50/1000 Janumet as he appears to have good control with this dose No neuropathy on exam  Hyperlipidemia: Well controlled  Hypertension: Well controlled on Avalide  He will follow-up in 6 months again  PREVNAR given   Anaalicia Reimann 08/17/2015, 1:15 PM

## 2015-08-17 NOTE — Patient Instructions (Signed)
Check blood sugars on waking up 1-2   times a week Also check blood sugars about 2 hours after a meal and do this after different meals by rotation  Recommended blood sugar levels on waking up is 90-130 and about 2 hours after meal is 130-160  Please bring your blood sugar monitor to each visit, thank you  

## 2015-09-05 ENCOUNTER — Other Ambulatory Visit: Payer: Self-pay | Admitting: Endocrinology

## 2015-09-10 ENCOUNTER — Other Ambulatory Visit: Payer: Self-pay | Admitting: *Deleted

## 2015-09-10 MED ORDER — SITAGLIPTIN PHOS-METFORMIN HCL 50-1000 MG PO TABS: 1.0000 | ORAL_TABLET | Freq: Every day | ORAL | Status: DC

## 2015-09-26 ENCOUNTER — Ambulatory Visit: Payer: Medicare Other | Admitting: Podiatry

## 2015-10-04 ENCOUNTER — Ambulatory Visit (INDEPENDENT_AMBULATORY_CARE_PROVIDER_SITE_OTHER): Payer: Medicare Other | Admitting: Podiatry

## 2015-10-04 ENCOUNTER — Encounter: Payer: Self-pay | Admitting: Podiatry

## 2015-10-04 DIAGNOSIS — B351 Tinea unguium: Secondary | ICD-10-CM

## 2015-10-04 DIAGNOSIS — M79676 Pain in unspecified toe(s): Secondary | ICD-10-CM | POA: Diagnosis not present

## 2015-10-04 DIAGNOSIS — M79675 Pain in left toe(s): Secondary | ICD-10-CM

## 2015-10-04 DIAGNOSIS — E119 Type 2 diabetes mellitus without complications: Secondary | ICD-10-CM

## 2015-10-04 NOTE — Progress Notes (Signed)
Patient ID: Derek BoatmanFred A [name], male   DOB: 09-30-1945, 70 y.o.   MRN: [id number] Complaint:  Visit Type: Patient returns to my office for continued preventative foot care services. Complaint: Patient states" my nails have grown long and thick and become painful to walk and wear shoes" Patient has been diagnosed with DM with no complications. He presents for preventative foot care services. No changes to ROS  Podiatric Exam: Vascular: dorsalis pedis and posterior tibial pulses are palpable bilateral. Capillary return is immediate. Temperature gradient is WNL. Skin turgor WNL  Sensorium: Normal Semmes Weinstein monofilament test. Normal tactile sensation bilaterally. Nail Exam: Pt has thick disfigured discolored nails with subungual debris noted bilateral entire nail hallux through fifth toenails Ulcer Exam: There is no evidence of ulcer or pre-ulcerative changes or infection. Orthopedic Exam: Muscle tone and strength are WNL. No limitations in general ROM. No crepitus or effusions noted. Foot type and digits show no abnormalities. Bony prominences are unremarkable. Skin: No Porokeratosis. No infection or ulcers  Diagnosis:  Tinea unguium, Pain in right toe, pain in left toes  Treatment & Plan Procedures and Treatment: Consent by patient was obtained for treatment procedures. The patient understood the discussion of treatment and procedures well. All questions were answered thoroughly reviewed. Debridement of mycotic and hypertrophic toenails, 1 through 5 bilateral and clearing of subungual debris. No ulceration, no infection noted.  Return Visit-Office Procedure: Patient instructed to return to the office for a follow up visit 3 months for continued evaluation and treatment.   Helane GuntherGregory Laelle [name] DPM

## 2015-10-25 ENCOUNTER — Ambulatory Visit (INDEPENDENT_AMBULATORY_CARE_PROVIDER_SITE_OTHER): Payer: Medicare Other | Admitting: Podiatry

## 2015-10-25 ENCOUNTER — Ambulatory Visit: Payer: Medicare Other | Admitting: Podiatry

## 2015-10-25 VITALS — BP 112/58 | HR 72 | Resp 14

## 2015-10-25 DIAGNOSIS — M79676 Pain in unspecified toe(s): Secondary | ICD-10-CM | POA: Diagnosis not present

## 2015-10-25 DIAGNOSIS — M79675 Pain in left toe(s): Secondary | ICD-10-CM | POA: Diagnosis not present

## 2015-10-25 DIAGNOSIS — B351 Tinea unguium: Secondary | ICD-10-CM

## 2015-10-25 DIAGNOSIS — E119 Type 2 diabetes mellitus without complications: Secondary | ICD-10-CM

## 2015-10-25 NOTE — Progress Notes (Signed)
Subjective:     Patient ID: Derek BoatmanFred A [name], male   DOB: 1946-06-28, 70 y.o.   MRN: [id number]  HPI this patient presents the office for scheduled nail surgery for the removal of the thick, mycotic, painful big toenail, right foot. This nail is painful as he walks and wears his shoes. He has attempted to live with this nail, but it has gotten to the point where the pain is becoming more noticeable. Therefore, he presents the office today for definitive evaluation and treatment of this condition   Review of Systems     Objective:   Physical Exam .Podiatric Exam: Vascular: dorsalis pedis and posterior tibial pulses are palpable bilateral. Capillary return is immediate. Temperature gradient is WNL. Skin turgor WNL  Sensorium: Normal Semmes Weinstein monofilament test. Normal tactile sensation bilaterally. Nail Exam: Pt has thick disfigured discolored nails with subungual debris noted bilateral entire nail hallux through fifth toenails Ulcer Exam: There is no evidence of ulcer or pre-ulcerative changes or infection. Orthopedic Exam: Muscle tone and strength are WNL. No limitations in general ROM. No crepitus or effusions noted. Foot type and digits show no abnormalities. Bony prominences are unremarkable. Skin: No Porokeratosis. No infection or ulcers    Assessment:  Onychomycosis Right Hallux     Plan:  ROV.  Nail suregery.  Treatment options and alternatives discussed.  Recommended permanent phenol matrixectomy and patient agreed.  Right hallux  was prepped with alcohol and a toe block of 3cc of 2% lidocaine plain was administered in a digital toe block. .  The toe was then prepped with betadine solution .  The offending nail  was then excised and matrix tissue exposed.  Phenol was then applied to the matrix tissue followed by an alcohol wash.  Antibiotic ointment and a dry sterile dressing was applied.  The patient was dispensed instructions for aftercare. RTC 1 week.     Derek [name] DPM

## 2015-11-01 ENCOUNTER — Encounter: Payer: Self-pay | Admitting: Podiatry

## 2015-11-01 ENCOUNTER — Ambulatory Visit (INDEPENDENT_AMBULATORY_CARE_PROVIDER_SITE_OTHER): Payer: Medicare Other | Admitting: Podiatry

## 2015-11-01 VITALS — BP 120/68 | HR 70 | Resp 14

## 2015-11-01 DIAGNOSIS — Z09 Encounter for follow-up examination after completed treatment for conditions other than malignant neoplasm: Secondary | ICD-10-CM

## 2015-11-01 NOTE — Progress Notes (Signed)
Patient ID: Derek BoatmanFred A [name], male   DOB: 1945-12-05, 70 y.o.   MRN: [id number] This patient returns to the office following nail surgery one week ago.  The patient says toe has been soaked and bandaged as directed.  There has been improvement of the toe since the surgery has been performed. The patient presents for continued evaluation and treatment.  GENERAL APPEARANCE: Alert, conversant. Appropriately groomed. No acute distress.  VASCULAR: Pedal pulses palpable at  [facility].  Capillary refill time is immediate to all digits,  Normal temperature gradient.    NEUROLOGIC: sensation is normal to 5.07 monofilament at 5/5 sites bilateral.  Light touch is intact bilateral, Muscle strength normal.  MUSCULOSKELETAL: acceptable muscle strength, tone and stability bilateral.  Intrinsic muscluature intact bilateral.  Rectus appearance of foot and digits noted bilateral.   DERMATOLOGIC: skin color, texture, and turgor are within normal limits.  No preulcerative lesions or ulcers  are seen, no interdigital maceration noted.   NAILS  There is necrotic tissue along the nail groove  In the absence of redness swelling and pain.  DX  S/p nail surgery  ROV  Home instructions were discussed.  Patient to call the office if there are any questions or concerns.   Helane GuntherGregory Ford Peddie DPM

## 2015-11-06 ENCOUNTER — Telehealth: Payer: Self-pay | Admitting: *Deleted

## 2015-11-06 NOTE — Telephone Encounter (Addendum)
Pt states he had an ingrown toenail procedure about 10 days ago, and there is a discoloration running down his toe and he would like an appt.  11/28/2015-Pt states he stumped his toe and it is bleeding and looks horrible.  I instructed pt to begin the same soaks as he performed after the toenail procedure and then an antibiotic ointment, until we got him in the office to be seen.  Pt states understanding and is transferred to schedulers.

## 2015-11-07 ENCOUNTER — Ambulatory Visit (INDEPENDENT_AMBULATORY_CARE_PROVIDER_SITE_OTHER): Payer: Medicare Other | Admitting: Podiatry

## 2015-11-07 ENCOUNTER — Encounter: Payer: Self-pay | Admitting: Podiatry

## 2015-11-07 VITALS — BP 126/79 | HR 88 | Resp 14

## 2015-11-07 DIAGNOSIS — Z09 Encounter for follow-up examination after completed treatment for conditions other than malignant neoplasm: Secondary | ICD-10-CM

## 2015-11-07 NOTE — Progress Notes (Signed)
Patient ID: Derek [name], male   DOB: 26-Oct-1945, 70 y.o.   MRN: [id number] This patient returns to the office following nail surgery one week ago.  The patient says toe has been soaked and bandaged as directed.  There has been improvement of the toe since the surgery has been performed. The patient presents for continued evaluation and treatment. Patient is concerned about the development of black tissue at site of his surgery.  Mild pain persists.  GENERAL APPEARANCE: Alert, conversant. Appropriately groomed. No acute distress.  VASCULAR: Pedal pulses palpable at  [facility] and PT bilateral.  Capillary refill time is immediate to all digits,  Normal temperature gradient.    NEUROLOGIC: sensation is normal to 5.07 monofilament at 5/5 sites bilateral.  Light touch is intact bilateral, Muscle strength normal.  MUSCULOSKELETAL: acceptable muscle strength, tone and stability bilateral.  Intrinsic muscluature intact bilateral.  Rectus appearance of foot and digits noted bilateral.   DERMATOLOGIC: skin color, texture, and turgor are within normal limits.  No preulcerative lesions or ulcers  are seen, no interdigital maceration noted.   NAILS  There is necrotic tissue along the nail groove  In the absence of redness swelling .  Patient has developed post inflammatory hyperpigmentation at surgical site.  DX  S/p nail surgery  ROV  Home instructions were discussed.  Patient to call the office if there are any questions or concerns.   Helane GuntherGregory Gaelen [name] DPM

## 2015-11-15 ENCOUNTER — Ambulatory Visit: Payer: Medicare Other | Admitting: Podiatry

## 2015-11-21 ENCOUNTER — Encounter: Payer: Self-pay | Admitting: Podiatry

## 2015-11-21 ENCOUNTER — Ambulatory Visit (INDEPENDENT_AMBULATORY_CARE_PROVIDER_SITE_OTHER): Payer: Medicare Other | Admitting: Podiatry

## 2015-11-21 VITALS — BP 110/69 | HR 84 | Resp 14

## 2015-11-21 DIAGNOSIS — Z09 Encounter for follow-up examination after completed treatment for conditions other than malignant neoplasm: Secondary | ICD-10-CM

## 2015-11-21 NOTE — Progress Notes (Signed)
Patient ID: Derek BoatmanFred A [name], male   DOB: [birth year], 70 y.o.   MRN: [id number] This patient returns to the office following nail surgery .  The patient says toe has been soaked and bandaged as directed.  There has been improvement of the toe since the surgery has been performed. The patient presents for continued evaluation and treatment. Patient says there is no pain associated with this toe.  He presents for evaluation and treatment..  GENERAL APPEARANCE: Alert, conversant. Appropriately groomed. No acute distress.  VASCULAR: Pedal pulses palpable at  [facility].  Capillary refill time is immediate to all digits,  Normal temperature gradient.    NEUROLOGIC: sensation is normal to 5.07 monofilament at 5/5 sites bilateral.  Light touch is intact bilateral, Muscle strength normal.  MUSCULOSKELETAL: acceptable muscle strength, tone and stability bilateral.  Intrinsic muscluature intact bilateral.  Rectus appearance of foot and digits noted bilateral.   DERMATOLOGIC: skin color, texture, and turgor are within normal limits.  No preulcerative lesions or ulcers  are seen, no interdigital maceration noted.   NAILS  There is normal granulation tissue noted at the surgical site.  The surgical site has no evidence of redness or infectopn. .  Patient has developed post inflammatory hyperpigmentation at surgical site.  DX  S/p nail surgery  ROV  Home instructions were discussed.  Patient to call the office if there are any questions or concerns. RTC 4 weeks.   Helane GuntherGregory Tyanna Hach DPM

## 2015-11-28 ENCOUNTER — Ambulatory Visit (INDEPENDENT_AMBULATORY_CARE_PROVIDER_SITE_OTHER): Payer: Medicare Other | Admitting: Podiatry

## 2015-11-28 ENCOUNTER — Encounter: Payer: Self-pay | Admitting: Podiatry

## 2015-11-28 VITALS — BP 96/54 | HR 96 | Resp 14

## 2015-11-28 DIAGNOSIS — Z09 Encounter for follow-up examination after completed treatment for conditions other than malignant neoplasm: Secondary | ICD-10-CM

## 2015-11-28 MED ORDER — DOXYCYCLINE HYCLATE 100 MG PO TABS: 100.0000 mg | ORAL_TABLET | Freq: Two times a day (BID) | ORAL | Status: DC

## 2015-11-28 NOTE — Progress Notes (Signed)
Subjective:     Patient ID: Derek BoatmanFred A Hem, male   DOB: September 25, 1945, 70 y.o.   MRN: [id number]  HPI this patient presents to the office stating that he injured the big toe, right foot. This is where  the toenail  was  permanently removed  and cauterization of the nail bed was performed. There is an area on the nail bed  which is now red and inflamed since the injury.  He says there is bleeding from the nail.  He is a diabetic and wants to take care of his toe.  He presents for evaluation and treatment.   Review of Systems     Objective:   Physical Exam Neurovascular status intact.  There is localized area at proximal nail bed which is red and inflamed.     Assessment:     S/p injury nail site right hallux.     Plan:     ROV  Localized infection right hallux nail bed.  Prescribed doxycycline.  Told him to peroxide surgical site.  RTC prn.   Helane GuntherGregory Christophere Hillhouse DPM

## 2015-12-04 ENCOUNTER — Ambulatory Visit: Payer: Medicare Other | Admitting: Allergy and Immunology

## 2015-12-13 ENCOUNTER — Other Ambulatory Visit: Payer: Self-pay | Admitting: Cardiovascular Disease

## 2015-12-13 NOTE — Telephone Encounter (Signed)
Rx(s) sent to pharmacy electronically.  

## 2015-12-14 ENCOUNTER — Encounter (HOSPITAL_BASED_OUTPATIENT_CLINIC_OR_DEPARTMENT_OTHER): Payer: Medicare Other

## 2015-12-27 ENCOUNTER — Encounter: Payer: Self-pay | Admitting: Podiatry

## 2015-12-27 ENCOUNTER — Ambulatory Visit (INDEPENDENT_AMBULATORY_CARE_PROVIDER_SITE_OTHER): Payer: Medicare Other | Admitting: Podiatry

## 2015-12-27 VITALS — BP 108/61 | HR 86 | Resp 14

## 2015-12-27 DIAGNOSIS — Z09 Encounter for follow-up examination after completed treatment for conditions other than malignant neoplasm: Secondary | ICD-10-CM

## 2015-12-27 NOTE — Progress Notes (Signed)
Subjective:     Patient ID: Derek BoatmanFred A [name], male   DOB: 11-20-1945, [age] y.o.   MRN: [id number]  HPI this patient returns to the office follow-up for nail surgery and the removal of the great toenail, right foot. He says he has developed thickened callus and believes the nail may be regrowing. He states he's having no pain or discomfort at this time. He presents the office for an evaluation and treatment of this condition   Review of Systems     Objective:   Physical ExamNeurovascular status intact.  Necrotic tissue and scab present on the nail bed right hallux.     Assessment:     S/p nail surgery     Plan:     Debride necrotic tissue right hallux.  RTC for scheduled preventive foot care services.  RTC July   Helane GuntherGregory Shermon Bozzi DPM

## 2016-01-23 ENCOUNTER — Encounter: Payer: Self-pay | Admitting: Podiatry

## 2016-01-23 ENCOUNTER — Ambulatory Visit (INDEPENDENT_AMBULATORY_CARE_PROVIDER_SITE_OTHER): Payer: Medicare Other | Admitting: Podiatry

## 2016-01-23 DIAGNOSIS — B351 Tinea unguium: Secondary | ICD-10-CM

## 2016-01-23 DIAGNOSIS — M79675 Pain in left toe(s): Secondary | ICD-10-CM | POA: Diagnosis not present

## 2016-01-23 DIAGNOSIS — E119 Type 2 diabetes mellitus without complications: Secondary | ICD-10-CM

## 2016-01-23 DIAGNOSIS — M79676 Pain in unspecified toe(s): Secondary | ICD-10-CM | POA: Diagnosis not present

## 2016-01-23 NOTE — Progress Notes (Signed)
Patient ID: Derek [name], male   DOB: [birth year], 70 y.o.   MRN: [id number] Complaint:  Visit Type: Patient returns to my office for continued preventative foot care services. Complaint: Patient states" my nails have grown long and thick and become painful to walk and wear shoes" Patient has been diagnosed with DM with no complications. He presents for preventative foot care services. No changes to ROS  Podiatric Exam: Vascular: dorsalis pedis and posterior tibial pulses are palpable bilateral. Capillary return is immediate. Temperature gradient is WNL. Skin turgor WNL  Sensorium: Normal Semmes Weinstein monofilament test. Normal tactile sensation bilaterally. Nail Exam: Pt has thick disfigured discolored nails with subungual debris noted bilateral entire nail hallux through fifth toenails Ulcer Exam: There is no evidence of ulcer or pre-ulcerative changes or infection. Orthopedic Exam: Muscle tone and strength are WNL. No limitations in general ROM. No crepitus or effusions noted. Foot type and digits show no abnormalities. Bony prominences are unremarkable. Skin: No Porokeratosis. No infection or ulcers  Diagnosis:  Tinea unguium, Pain in right toe, pain in left toes  Treatment & Plan Procedures and Treatment: Consent by patient was obtained for treatment procedures. The patient understood the discussion of treatment and procedures well. All questions were answered thoroughly reviewed. Debridement of mycotic and hypertrophic toenails, 1 through 5 bilateral and clearing of subungual debris. No ulceration, no infection noted. Healing right hallux nail bed is noted.To schedule his surgery left hallux in 4 weeks. Return Visit-Office Procedure: Patient instructed to return to the office for a follow up visit 3 months for continued evaluation and treatment.   Helane GuntherGregory Rontavious Albright DPM

## 2016-02-02 ENCOUNTER — Ambulatory Visit (INDEPENDENT_AMBULATORY_CARE_PROVIDER_SITE_OTHER): Payer: Medicare Other | Admitting: Emergency Medicine

## 2016-02-02 DIAGNOSIS — H5712 Ocular pain, left eye: Secondary | ICD-10-CM | POA: Diagnosis not present

## 2016-02-02 NOTE — Patient Instructions (Signed)
I did not see a foreign body in your. eye If you continue to have a foreign body sensation after 48-72 hours please have your recheck.

## 2016-02-02 NOTE — Progress Notes (Signed)
Patient ID: Derek [name], male   DOB: [birth year], 70 y.o.   MRN: [id number]    By signing my name below, I, Essence Howell, attest that this documentation has been prepared under the direction and in the presence of Collene Gobble, MD Electronically Signed: Charline Bills, ED Scribe 02/02/2016 at 2:02 PM.  Chief Complaint:  Chief Complaint  Patient presents with  . Eye Pain    Left eye, insect hit him in the eye. Feels as if something is in his eye.   HPI: Derek [name] is a 70 y.o. male who reports to [facility] today complaining of persistent foreign body sensation in the left eye since yesterday. Pt states that he was walking outdoors yesterday when an insect flew in his left eye. He washed his left eye out with systane yesterday with minimal relief. Allergy to penicillin.   Past Medical History  Diagnosis Date  . COPD (chronic obstructive pulmonary disease) (HCC)   . Anxiety   . Arrhythmia   . Asthma   . Chronic headaches   . COPD (chronic obstructive pulmonary disease) (HCC)   . Depression   . Diabetes (HCC)   . Diverticulosis   . GERD (gastroesophageal reflux disease)   . Glaucoma   . Hyperlipidemia   . Hypertension   . IBS (irritable bowel syndrome)   . Pneumonia   . Sleep apnea    Past Surgical History  Procedure Laterality Date  . Rotator cuff repair  2008  . Lumbar spine surgery  2010   Social History   Social History  . Marital Status: Married    Spouse Name: N/A  . Number of Children: 2  . Years of Education: N/A   Occupational History  . Retired    Social History Main Topics  . Smoking status: Never Smoker   . Smokeless tobacco: Never Used  . Alcohol Use: No  . Drug Use: No  . Sexual Activity: Not Asked   Other Topics Concern  . None   Social History Narrative   Family History  Problem Relation Age of Onset  . Diabetes    . Arthritis Mother   . Hypertension Mother   . Heart failure Father    Allergies  Allergen Reactions  . Penicillins Rash    Prior to Admission medications   Medication Sig Start Date End Date Taking? Authorizing Provider  aspirin 81 MG tablet Take 81 mg by mouth daily.   Yes Historical Provider, MD  chlordiazePOXIDE (LIBRIUM) 25 MG capsule Take 25 mg by mouth daily. 08/03/14  Yes Historical Provider, MD  doxycycline (VIBRA-TABS) 100 MG tablet Take 1 tablet (100 mg total) by mouth 2 (two) times daily. 11/28/15  Yes [name], DPM  esomeprazole (NEXIUM) 20 MG capsule Take 20 mg by mouth 2 (two) times daily. 01/04/15  Yes Historical Provider, MD  glucose blood (FREESTYLE LITE) test strip Use as instructed to check blood sugars ONE (1) time per day dx code E11.9 02/15/15  Yes Reather Littler, MD  HYDROcodone-acetaminophen (NORCO/VICODIN) 5-325 MG per tablet Take 1 tablet by mouth every 6 (six) hours as needed for pain.   Yes Historical Provider, MD  irbesartan-hydrochlorothiazide (AVALIDE) 150-12.5 MG per tablet Take 1 tablet by mouth daily.   Yes Historical Provider, MD  LUMIGAN 0.01 % SOLN  10/16/14  Yes Historical Provider, MD  meclizine (ANTIVERT) 25 MG tablet Take 25 mg by mouth 3 (three) times daily. 01/08/15  Yes Historical Provider, MD  metoprolol succinate (TOPROL-XL) 25 MG  24 hr tablet Take 1 tablet (25 mg total) by mouth daily. PLEASE CONTACT OFFICE FOR ADDITIONAL REFILLS 12/13/15  Yes Mihai Croitoru, MD  potassium chloride (K-DUR,KLOR-CON) 10 MEQ tablet Take 10 mEq by mouth 2 (two) times daily.   Yes Historical Provider, MD  sertraline (ZOLOFT) 100 MG tablet Take 100 mg by mouth daily.   Yes Historical Provider, MD  simvastatin (ZOCOR) 40 MG tablet Take 40 mg by mouth every evening.   Yes Historical Provider, MD  sitaGLIPtin-metformin (JANUMET) 50-1000 MG tablet Take 1 tablet by mouth daily. 09/10/15  Yes Reather Littler, MD  SYMBICORT 160-4.5 MCG/ACT inhaler INHALE 2 PUFFS INTO THE LUNGS EVERY DAY 09/23/15  Yes Historical Provider, MD  tamsulosin (FLOMAX) 0.4 MG CAPS capsule Take 0.4 mg by mouth.   Yes Historical Provider, MD   triamcinolone ointment (KENALOG) 0.1 % Apply 1 application topically 2 (two) times daily. 04/08/15  Yes Collie Siad English, PA  VENTOLIN HFA 108 (90 BASE) MCG/ACT inhaler  07/17/14  Yes Historical Provider, MD  zolpidem (AMBIEN) 10 MG tablet Take 10 mg by mouth at bedtime as needed for sleep.   Yes Historical Provider, MD   ROS: The patient denies fevers, chills, night sweats, unintentional weight loss, chest pain, palpitations, wheezing, dyspnea on exertion, nausea, vomiting, abdominal pain, dysuria, hematuria, melena, numbness, weakness, or tingling.   All other systems have been reviewed and were otherwise negative with the exception of those mentioned in the HPI and as above.    PHYSICAL EXAM: There were no vitals filed for this visit. There is no weight on file to calculate BMI.  General: Alert, no acute distress HEENT:  Normocephalic, atraumatic, oropharynx patent. Minimal redness of the conjunctiva of the sclera medial L eye. 2 drops of pontocaine instilled. Lid was swabbed after eversion. Area abover the L upper lid was irrigated with saline wash. Pt tolerated well.   Eye: Nonie Hoyer [facility] Cardiovascular:  Regular rate and rhythm, no rubs murmurs or gallops.  No Carotid bruits, radial pulse intact. No pedal edema.  Respiratory: Clear to auscultation bilaterally.  No wheezes, rales, or rhonchi.  No cyanosis, no use of accessory musculature Abdominal: No organomegaly, abdomen is soft and non-tender, positive bowel sounds.  No masses. Musculoskeletal: Gait intact. No edema, tenderness Skin: No rashes. Neurologic: Facial musculature symmetric. Psychiatric: Patient acts appropriately throughout our interaction. Lymphatic: No cervical or submandibular lymphadenopathy  LABS:  EKG/XRAY:   Primary read interpreted by Dr. Cleta Alberts at [facility].  ASSESSMENT/PLAN:I did not see a foreign body in his. I everted the lid and swabbed it as well as irrigated the with solution. He will have the eye rechecked  in 48-72 hours if he continues to have symptoms.I personally performed the services described in this documentation, which was scribed in my presence. The recorded information has been reviewed and is accurate.  Gross sideeffects, risk and benefits, and alternatives of medications d/w patient. Patient is aware that all medications have potential sideeffects and we are unable to predict every sideeffect or drug-drug interaction that may occur.  Lesle Chris MD 02/02/2016 1:46 PM

## 2016-02-08 ENCOUNTER — Other Ambulatory Visit: Payer: Self-pay

## 2016-02-08 MED ORDER — SITAGLIPTIN PHOS-METFORMIN HCL 50-1000 MG PO TABS: 1.0000 | ORAL_TABLET | Freq: Every day | ORAL | 3 refills | Status: DC

## 2016-02-11 ENCOUNTER — Other Ambulatory Visit (INDEPENDENT_AMBULATORY_CARE_PROVIDER_SITE_OTHER): Payer: Medicare Other

## 2016-02-11 DIAGNOSIS — E119 Type 2 diabetes mellitus without complications: Secondary | ICD-10-CM

## 2016-02-11 LAB — BASIC METABOLIC PANEL
BUN: 12 mg/dL (ref 6–23)
CALCIUM: 9.5 mg/dL (ref 8.4–10.5)
CO2: 28 meq/L (ref 19–32)
CREATININE: 0.84 mg/dL (ref 0.40–1.50)
Chloride: 102 mEq/L (ref 96–112)
GFR: 115.72 mL/min (ref >60.00)
GLUCOSE: 125 mg/dL — AB (ref 70–99)
Potassium: 3.8 mEq/L (ref 3.5–5.1)
Sodium: 137 mEq/L (ref 135–145)

## 2016-02-11 LAB — HEMOGLOBIN A1C: HEMOGLOBIN A1C: 6 % (ref 4.6–6.5)

## 2016-02-14 ENCOUNTER — Ambulatory Visit (INDEPENDENT_AMBULATORY_CARE_PROVIDER_SITE_OTHER): Payer: Medicare Other | Admitting: Endocrinology

## 2016-02-14 ENCOUNTER — Encounter: Payer: Self-pay | Admitting: Endocrinology

## 2016-02-14 VITALS — BP 106/70 | HR 90 | Ht 69.0 in | Wt 180.0 lb

## 2016-02-14 DIAGNOSIS — E119 Type 2 diabetes mellitus without complications: Secondary | ICD-10-CM

## 2016-02-14 NOTE — Patient Instructions (Signed)
Check blood sugars on waking up  2-3x per week  Also check blood sugars about 2 hours after a meal and do this after different meals by rotation  Recommended blood sugar levels on waking up is 90-130 and about 2 hours after meal is 130-160  Please bring your blood sugar monitor to each visit, thank you  Take janumet at night

## 2016-02-14 NOTE — Progress Notes (Signed)
Patient ID: Derek [name], male   DOB: 06/18/1946, 70 y.o.   MRN: [id number]           Reason for Appointment: Diabetes follow-up   History of Present Illness   Diagnosis: Type 2 DIABETES MELITUS, date of diagnosis: 2008      Past history: Although his glucose was 600 at diagnosis he was initially treated with metformin and subsequently had relatively milder diabetes Januvia was added to his regimen in 2011  He has had relatively mild diabetes which is consistently well controlled with regimen of Janumet 50/1000 once daily Usually has upper normal A1c consistently and now 6% He has checked blood sugar at various times of the day but usually not after supper  He is still fairly compliant with his walking program  He thinks he is generally watching his diet most of the time and his weight is stable      Side effects from medications: None      Monitors blood glucose:  less than once a day.    Glucometer:  FreeStyle            Blood Glucose readings: Range 73-145 with overall AVERAGE 104  HYPOGLYCEMIA: He denies any symptoms and lowest blood sugars 73  Physical activity: exercise: walking 3/7 days, around 2 miles usually, weather permitting            Dietician visit: Most recent: Unknown           Wt Readings from Last 3 Encounters:  02/14/16 180 lb (81.6 kg)  08/17/15 181 lb 6.4 oz (82.3 kg)  04/08/15 178 lb 9.6 oz (81 kg)   Lab Results  Component Value Date   HGBA1C 6.0 02/11/2016   HGBA1C 6.1 08/13/2015   HGBA1C 5.9 02/12/2015   Lab Results  Component Value Date   MICROALBUR 0.7 08/17/2015   LDLCALC 53 08/14/2014   CREATININE 0.84 02/11/2016    Lab on 02/11/2016  Component Date Value Ref Range Status  . Hgb A1c MFr Bld 02/11/2016 6.0  4.6 - 6.5 % Final  . Sodium 02/11/2016 137  135 - 145 mEq/L Final  . Potassium 02/11/2016 3.8  3.5 - 5.1 mEq/L Final  . Chloride 02/11/2016 102  96 - 112 mEq/L Final  . CO2 02/11/2016 28  19 - 32 mEq/L Final  . Glucose, Bld  02/11/2016 125* 70 - 99 mg/dL Final  . BUN 40/98/1191 12  6 - 23 mg/dL Final  . Creatinine, Ser 02/11/2016 0.84  0.40 - 1.50 mg/dL Final  . Calcium 47/82/9562 9.5  8.4 - 10.5 mg/dL Final  . GFR 13/02/6577 115.72  >60.00 mL/min Final      Medication List       Accurate as of 02/14/16  3:06 PM. Always use your most recent med list.          aspirin 81 MG tablet Take 81 mg by mouth daily.   chlordiazePOXIDE 25 MG capsule Commonly known as:  LIBRIUM Take 25 mg by mouth daily.   doxycycline 100 MG tablet Commonly known as:  VIBRA-TABS Take 1 tablet (100 mg total) by mouth 2 (two) times daily.   esomeprazole 20 MG capsule Commonly known as:  NEXIUM Take 20 mg by mouth 2 (two) times daily.   glucose blood test strip Commonly known as:  FREESTYLE LITE Use as instructed to check blood sugars ONE (1) time per day dx code E11.9   HYDROcodone-acetaminophen 5-325 MG tablet Commonly known as:  NORCO/VICODIN Take 1  tablet by mouth every 6 (six) hours as needed for pain.   irbesartan-hydrochlorothiazide 150-12.5 MG tablet Commonly known as:  AVALIDE Take 1 tablet by mouth daily.   LUMIGAN 0.01 % Soln Generic drug:  bimatoprost   meclizine 25 MG tablet Commonly known as:  ANTIVERT Take 25 mg by mouth 3 (three) times daily.   metoprolol succinate 25 MG 24 hr tablet Commonly known as:  TOPROL-XL Take 1 tablet (25 mg total) by mouth daily. PLEASE CONTACT OFFICE FOR ADDITIONAL REFILLS   potassium chloride 10 MEQ tablet Commonly known as:  K-DUR,KLOR-CON Take 10 mEq by mouth 2 (two) times daily.   sertraline 100 MG tablet Commonly known as:  ZOLOFT Take 100 mg by mouth daily.   simvastatin 40 MG tablet Commonly known as:  ZOCOR Take 40 mg by mouth every evening.   sitaGLIPtin-metformin 50-1000 MG tablet Commonly known as:  JANUMET Take 1 tablet by mouth daily.   SYMBICORT 160-4.5 MCG/ACT inhaler Generic drug:  budesonide-formoterol INHALE 2 PUFFS INTO THE LUNGS EVERY  DAY   tamsulosin 0.4 MG Caps capsule Commonly known as:  FLOMAX Take 0.4 mg by mouth.   triamcinolone ointment 0.1 % Commonly known as:  KENALOG Apply 1 application topically 2 (two) times daily.   VENTOLIN HFA 108 (90 Base) MCG/ACT inhaler Generic drug:  albuterol   zolpidem 10 MG tablet Commonly known as:  AMBIEN Take 10 mg by mouth at bedtime as needed for sleep.       Allergies:  Allergies  Allergen Reactions  . Penicillins Rash    Past Medical History:  Diagnosis Date  . Anxiety   . Arrhythmia   . Asthma   . Chronic headaches   . COPD (chronic obstructive pulmonary disease) (HCC)   . COPD (chronic obstructive pulmonary disease) (HCC)   . Depression   . Diabetes (HCC)   . Diverticulosis   . GERD (gastroesophageal reflux disease)   . Glaucoma   . Hyperlipidemia   . Hypertension   . IBS (irritable bowel syndrome)   . Pneumonia   . Sleep apnea     Past Surgical History:  Procedure Laterality Date  . LUMBAR SPINE SURGERY  2010  . ROTATOR CUFF REPAIR  2008    Family History  Problem Relation Age of Onset  . Diabetes    . Arthritis Mother   . Hypertension Mother   . Heart failure Father     Social History:  reports that he has never smoked. He has never used smokeless tobacco. He reports that he does not drink alcohol or use drugs.  Review of Systems:  HYPERTENSION:   well controlled with Avalide   On Toprol 25 mg for  fast heart rate  HYPERLIPIDEMIA: The lipid abnormality consists of elevated LDL and low HDL treated with simvastatin 40; his HDL is 40 Not clear what his results were with his PCP done earlier this year .   Lab Results  Component Value Date   CHOL 133 08/14/2014   HDL 40.40 08/14/2014   LDLCALC 53 08/14/2014   TRIG 196.0 (H) 08/14/2014   CHOLHDL 3 08/14/2014    Diabetic foot exam 2/17   Examination:   BP 106/70 (BP Location: Left Arm, Patient Position: Sitting, Cuff Size: Normal)   Pulse 90   Ht  (1.753 m)   Wt  180 lb (81.6 kg)   SpO2 98%   BMI 26.58 kg/m   Body mass index is 26.58 kg/m.     ASSESSMENT/ PLAN:  Diabetes type 2   The patient's diabetes control appears to be  excellent again with near-normal home blood sugars and upper normal   A1cIs excellent at 6% Fasting glucose appear to be relatively highat home, postprandial readings are not high but he has not done any readings after supper and reminded him to do so He is still fairly consistent with his diet and walking for exercise  He can continue the 50/1000 Janumet as he appears to have good control with the lower dose  Hyperlipidemia:  We will try to get reports from his PCP of recent labs  Hypertension: Well controlled on Avalide, also monitored by PCP  He will follow-up in 6 months again    [facility] 02/14/2016, 3:06 PM

## 2016-03-06 ENCOUNTER — Encounter: Payer: Self-pay | Admitting: Podiatry

## 2016-03-06 ENCOUNTER — Ambulatory Visit (INDEPENDENT_AMBULATORY_CARE_PROVIDER_SITE_OTHER): Payer: Medicare Other | Admitting: Podiatry

## 2016-03-06 DIAGNOSIS — M79676 Pain in unspecified toe(s): Secondary | ICD-10-CM | POA: Diagnosis not present

## 2016-03-06 DIAGNOSIS — M79675 Pain in left toe(s): Secondary | ICD-10-CM | POA: Diagnosis not present

## 2016-03-06 DIAGNOSIS — B351 Tinea unguium: Secondary | ICD-10-CM

## 2016-03-06 DIAGNOSIS — E119 Type 2 diabetes mellitus without complications: Secondary | ICD-10-CM | POA: Diagnosis not present

## 2016-03-06 MED ORDER — DOXYCYCLINE HYCLATE 100 MG PO TABS: 100.0000 mg | ORAL_TABLET | Freq: Two times a day (BID) | ORAL | 0 refills | Status: AC

## 2016-03-06 NOTE — Progress Notes (Signed)
This patient returns to the office for scheduled surgery for the removal of the left great toenail. This toenail is painful walking and wearing his shoes. This patient is diabetic with no foot complications. He previously had the right great toenail surgically removed and requests the left great toenail be removed.  GENERAL APPEARANCE: Alert, conversant. Appropriately groomed. No acute distress.  VASCULAR: Pedal pulses are  palpable at  [facility] bilateral.  Capillary refill time is immediate to all digits,  Normal temperature gradient.    NEUROLOGIC: sensation is normal to 5.07 monofilament at 5/5 sites bilateral.  Light touch is intact bilateral, Muscle strength normal.  MUSCULOSKELETAL: acceptable muscle strength, tone and stability bilateral.  Intrinsic muscluature intact bilateral.  Rectus appearance of foot and digits noted bilateral.   DERMATOLOGIC: skin color, texture, and turgor are within normal limits.  No preulcerative lesions or ulcers  are seen, no interdigital maceration noted.  No open lesions present.  . No drainage noted.  NAILS  Thick disfigured discolored left nail plate.  Healing right great toe at nail bed.  Onychomycosis  B/L  ROV  Nail surgery.  Treatment options and alternatives discussed.  Recommended permanent phenol matrixectomy and patient agreed.  Left hallux  was prepped with alcohol and a toe block of 3cc of 2% lidocaine plain was administered in a digital toe block. .  The toe was then prepped with betadine solution .  The offending nail border was then excised and matrix tissue exposed.  Phenol was then applied to the matrix tissue followed by an alcohol wash.  Antibiotic ointment and a dry sterile dressing was applied.  The patient was dispensed instructions for aftercare. Doxycycline prescribed.  RTC 10 days.     Helane GuntherGregory Gibril Mastro DPM     Helane GuntherGregory Doye Montilla DPM

## 2016-03-07 ENCOUNTER — Telehealth: Payer: Self-pay | Admitting: *Deleted

## 2016-03-07 NOTE — Telephone Encounter (Signed)
Pt states he had a procedure with Dr. Stacie AcresMayer yesterday morning, and when he got out of the chair he felt dizzy and whoozy, and the same at the car and again this morning. I told pt I didn't feel that what is going on today was a carry over from the procedure, that he may be dehydrated or there may be something he needed to discuss with his PCP. Pt states he just wanted us to have it in our records.

## 2016-03-13 ENCOUNTER — Other Ambulatory Visit: Payer: Self-pay | Admitting: Cardiovascular Disease

## 2016-03-19 ENCOUNTER — Ambulatory Visit (INDEPENDENT_AMBULATORY_CARE_PROVIDER_SITE_OTHER): Payer: Medicare Other | Admitting: Podiatry

## 2016-03-19 ENCOUNTER — Encounter: Payer: Self-pay | Admitting: Podiatry

## 2016-03-19 DIAGNOSIS — Z09 Encounter for follow-up examination after completed treatment for conditions other than malignant neoplasm: Secondary | ICD-10-CM

## 2016-03-19 DIAGNOSIS — E119 Type 2 diabetes mellitus without complications: Secondary | ICD-10-CM

## 2016-03-19 NOTE — Progress Notes (Signed)
This patient returns to the office following nail surgery one week ago.  The patient says toe has been soaked and bandaged as directed.  There has been improvement of the toe since the surgery has been performed. The patient presents for continued evaluation and treatment.  GENERAL APPEARANCE: Alert, conversant. Appropriately groomed. No acute distress.  VASCULAR: Pedal pulses palpable at  [facility] and [facility] bilateral.  Capillary refill time is immediate to all digits,  Normal temperature gradient.    NEUROLOGIC: sensation is normal to 5.07 monofilament at 5/5 sites bilateral.  Light touch is intact bilateral, Muscle strength normal.  MUSCULOSKELETAL: acceptable muscle strength, tone and stability bilateral.  Intrinsic muscluature intact bilateral.  Rectus appearance of foot and digits noted bilateral.   DERMATOLOGIC: skin color, texture, and turgor are within normal limits.  No preulcerative lesions or ulcers  are seen, no interdigital maceration noted.   NAILS  There is necrotic tissue along the nail bed   In the absence of redness swelling and pain.  DX  S/p nail surgery  ROV  Home instructions were discussed.  Patient to call the office if there are any questions or concerns.   Helane GuntherGregory Zacarias Krauter DPM

## 2016-04-14 ENCOUNTER — Other Ambulatory Visit (HOSPITAL_COMMUNITY): Payer: Self-pay | Admitting: Pulmonary Disease

## 2016-04-14 DIAGNOSIS — R42 Dizziness and giddiness: Secondary | ICD-10-CM

## 2016-04-16 ENCOUNTER — Ambulatory Visit (INDEPENDENT_AMBULATORY_CARE_PROVIDER_SITE_OTHER): Payer: Medicare Other | Admitting: Podiatry

## 2016-04-16 DIAGNOSIS — E119 Type 2 diabetes mellitus without complications: Secondary | ICD-10-CM

## 2016-04-16 DIAGNOSIS — L089 Local infection of the skin and subcutaneous tissue, unspecified: Secondary | ICD-10-CM | POA: Diagnosis not present

## 2016-04-16 MED ORDER — DOXYCYCLINE HYCLATE 100 MG PO TABS: 100.0000 mg | ORAL_TABLET | Freq: Two times a day (BID) | ORAL | 0 refills | Status: DC

## 2016-04-16 NOTE — Progress Notes (Signed)
This patient returns to the office following nail surgery on his left great toe. There is necrotic tissue noted on the nailbed and drainage noted at the site. The proximal nail fold is blackened, but no evidence of any infection is noted. He returns to the office for continued evaluation and treatment of this surgical site  GENERAL APPEARANCE: Alert, conversant. Appropriately groomed. No acute distress.  VASCULAR: [facility]  [facility].  Capillary refill time is immediate to all digits,  Normal temperature gradient.    NEUROLOGIC: sensation is normal to 5.07 monofilament at 5/5 sites bilateral.  Light touch is intact bilateral, Muscle strength normal.  MUSCULOSKELETAL: acceptable muscle strength, tone and stability bilateral.  Intrinsic muscluature intact bilateral.  Rectus appearance of foot and digits noted bilateral.   DERMATOLOGIC: skin color, texture, and turgor are within normal limits.  No preulcerative lesions or ulcers  are seen, no interdigital maceration noted.   NAILS  necrotic tissue noted on the nail bed of the left great toe drainage noted at the site. No infection extending up the toe onto his foot  DX  Localized skin infection  Left hallux  ROV  Home instructions were discussed. Patient was prescribed doxycycline to take one tablet twice a day. He was told to peroxide the surgical site. He was told to bandage the surgical site. When he is wearing shoes with Neosporin and a dry sterile dressing return to the office when necessary   Helane GuntherGregory Paullette Mckain DPM   Helane GuntherGregory Marlane Hirschmann DPM

## 2016-04-23 ENCOUNTER — Encounter: Payer: Self-pay | Admitting: Podiatry

## 2016-04-23 ENCOUNTER — Ambulatory Visit (INDEPENDENT_AMBULATORY_CARE_PROVIDER_SITE_OTHER): Payer: Medicare Other | Admitting: Podiatry

## 2016-04-23 VITALS — BP 147/89 | HR 75 | Resp 14

## 2016-04-23 DIAGNOSIS — B351 Tinea unguium: Secondary | ICD-10-CM

## 2016-04-23 DIAGNOSIS — M79675 Pain in left toe(s): Secondary | ICD-10-CM | POA: Diagnosis not present

## 2016-04-23 DIAGNOSIS — E119 Type 2 diabetes mellitus without complications: Secondary | ICD-10-CM

## 2016-04-23 NOTE — Progress Notes (Signed)
This patient returns to the office preventative foot care services for treatment of his second through fifth toenails both feet. He says the nails have grown long and thick and are painful walking and wearing his shoes. Both surgical sites on the great toes have healed with no drainage or infection or pain noted  GENERAL APPEARANCE: Alert, conversant. Appropriately groomed. No acute distress.  VASCULAR: Pedal pulses palpable at  DP and PT bilateral.  Capillary refill time is immediate to all digits,  Normal temperature gradient.    NEUROLOGIC: sensation is normal to 5.07 monofilament at 5/5 sites bilateral.  Light touch is intact bilateral, Muscle strength normal.  MUSCULOSKELETAL: acceptable muscle strength, tone and stability bilateral.  Intrinsic muscluature intact bilateral.  Rectus appearance of foot and digits noted bilateral.   DERMATOLOGIC: skin color, texture, and turgor are within normal limits.  No preulcerative lesions or ulcers  are seen, no interdigital maceration noted.   NAILS  thick disfigured discolored nails both feet  DX  Onychomycosis 2-5  B/L  ROV  debridement and grinding of long thick painful nails bilaterally. Patient was told he can discontinue peroxide washes and finish his antibiotics. Return to the office in 10 weeks for further evaluation and treatment     Shatona Andujar DPM   

## 2016-04-24 ENCOUNTER — Ambulatory Visit (HOSPITAL_COMMUNITY)
Admission: RE | Admit: 2016-04-24 | Discharge: 2016-04-24 | Disposition: A | Discharge status: Home / Self Care | Payer: Medicare Other | Source: Ambulatory Visit | Attending: Pulmonary Disease | Admitting: Pulmonary Disease

## 2016-04-24 DIAGNOSIS — R42 Dizziness and giddiness: Secondary | ICD-10-CM | POA: Diagnosis not present

## 2016-04-24 LAB — VAS US CAROTID
LCCADSYS: -70 cm/s
LCCAPDIAS: 11 cm/s
LCCAPSYS: 66 cm/s
LEFT ECA DIAS: -11 cm/s
LEFT VERTEBRAL DIAS: 12 cm/s
LICAPDIAS: -22 cm/s
Left CCA dist dias: -19 cm/s
Left ICA dist dias: -28 cm/s
Left ICA dist sys: -83 cm/s
Left ICA prox sys: -61 cm/s
RCCADSYS: -94 cm/s
RCCAPDIAS: 20 cm/s
RCCAPSYS: 103 cm/s
RIGHT ECA DIAS: -8 cm/s
RIGHT VERTEBRAL DIAS: -7 cm/s

## 2016-04-24 NOTE — Progress Notes (Signed)
VASCULAR LAB PRELIMINARY  PRELIMINARY  PRELIMINARY  PRELIMINARY  Carotid duplex completed.    Preliminary report:  Bilateral - No evidence of ICA stenosis. Vertebral artery flow is antegrade  Derek [name], RVS 04/24/2016, 3:58 PM

## 2016-05-03 ENCOUNTER — Encounter (HOSPITAL_COMMUNITY): Payer: Self-pay | Admitting: Family Medicine

## 2016-05-03 ENCOUNTER — Ambulatory Visit (HOSPITAL_COMMUNITY)
Admission: EM | Admit: 2016-05-03 | Discharge: 2016-05-03 | Disposition: A | Discharge status: Home / Self Care | Payer: Medicare Other | Attending: Family Medicine | Admitting: Family Medicine

## 2016-05-03 DIAGNOSIS — Z23 Encounter for immunization: Secondary | ICD-10-CM

## 2016-05-03 DIAGNOSIS — S61211A Laceration without foreign body of left index finger without damage to nail, initial encounter: Secondary | ICD-10-CM

## 2016-05-03 MED ORDER — TETANUS-DIPHTH-ACELL PERTUSSIS 5-2.5-18.5 LF-MCG/0.5 IM SUSP
INTRAMUSCULAR | Status: AC
  Filled 2016-05-03: qty 0.5

## 2016-05-03 MED ORDER — TETANUS-DIPHTH-ACELL PERTUSSIS 5-2.5-18.5 LF-MCG/0.5 IM SUSP
0.5000 mL | Freq: Once | INTRAMUSCULAR | Status: AC
Start: 2016-05-03 — End: 2016-05-03
  Administered 2016-05-03: 0.5 mL via INTRAMUSCULAR

## 2016-05-03 NOTE — ED Triage Notes (Signed)
Pt here for abrasion to left pointer finger. sts he fell and was carrying a glass table and it broke scraping finger and bleeding.

## 2016-05-03 NOTE — ED Provider Notes (Signed)
[facility]    CSN: [id number] Arrival date & time: 05/03/16  1212     History   Chief Complaint Chief Complaint  Patient presents with  . Finger Injury    HPI Derek [name] is a 70 y.o. male.   The history is provided by the patient.  Hand Injury  Location:  Finger Finger location:  L index finger Injury: yes   Time since incident:  1 hour Mechanism of injury comment:  Glass table broke with lif skin abrasion. Pain details:    Quality:  Sharp   Radiates to:  Does not radiate   Severity:  Mild   Onset quality:  Sudden   Progression:  Unchanged Dislocation: no   Foreign body present:  No foreign bodies Tetanus status:  Out of date Prior injury to area:  No Worsened by:  Nothing Associated symptoms: no decreased range of motion, no numbness, no swelling and no tingling     Past Medical History:  Diagnosis Date  . Anxiety   . Arrhythmia   . Asthma   . Chronic headaches   . COPD (chronic obstructive pulmonary disease) (HCC)   . COPD (chronic obstructive pulmonary disease) (HCC)   . Depression   . Diabetes (HCC)   . Diverticulosis   . GERD (gastroesophageal reflux disease)   . Glaucoma   . Hyperlipidemia   . Hypertension   . IBS (irritable bowel syndrome)   . Pneumonia   . Sleep apnea     Patient Active Problem List   Diagnosis Date Noted  . Leaky heart valve 05/12/2014  . GERD (gastroesophageal reflux disease) 10/11/2013  . Belching 10/11/2013  . Essential hypertension 03/16/2013  . Hyperlipidemia 03/16/2013  . Type 2 diabetes mellitus (HCC) 03/07/2013    Past Surgical History:  Procedure Laterality Date  . LUMBAR SPINE SURGERY  2010  . ROTATOR CUFF REPAIR  2008       Home Medications    Prior to Admission medications   Medication Sig Start Date End Date Taking? Authorizing Provider  aspirin 81 MG tablet Take 81 mg by mouth daily.    Historical Provider, MD  chlordiazePOXIDE (LIBRIUM) 25 MG capsule Take 25 mg by mouth daily.  08/03/14   Historical Provider, MD  doxycycline (VIBRA-TABS) 100 MG tablet Take 1 tablet (100 mg total) by mouth 2 (two) times daily. 04/16/16   Helane GuntherGregory Mayer, DPM  esomeprazole (NEXIUM) 20 MG capsule Take 20 mg by mouth 2 (two) times daily. 01/04/15   Historical Provider, MD  glucose blood (FREESTYLE LITE) test strip Use as instructed to check blood sugars ONE (1) time per day dx code E11.9 02/15/15   Reather LittlerAjay Kumar, MD  HYDROcodone-acetaminophen (NORCO/VICODIN) 5-325 MG per tablet Take 1 tablet by mouth every 6 (six) hours as needed for pain.    Historical Provider, MD  irbesartan-hydrochlorothiazide (AVALIDE) 150-12.5 MG per tablet Take 1 tablet by mouth daily.    Historical Provider, MD  LUMIGAN 0.01 % SOLN  10/16/14   Historical Provider, MD  meclizine (ANTIVERT) 25 MG tablet Take 25 mg by mouth 3 (three) times daily. 01/08/15   Historical Provider, MD  metoprolol succinate (TOPROL-XL) 25 MG 24 hr tablet TAKE 1 TABLET BY MOUTH EVERY DAY 03/13/16   Mihai Croitoru, MD  potassium chloride (K-DUR,KLOR-CON) 10 MEQ tablet Take 10 mEq by mouth 2 (two) times daily.    Historical Provider, MD  sertraline (ZOLOFT) 100 MG tablet Take 100 mg by mouth daily.    Historical Provider,  MD  simvastatin (ZOCOR) 40 MG tablet Take 40 mg by mouth every evening.    Historical Provider, MD  sitaGLIPtin-metformin (JANUMET) 50-1000 MG tablet Take 1 tablet by mouth daily. 02/08/16   Reather Littler, MD  SYMBICORT 160-4.5 MCG/ACT inhaler INHALE 2 PUFFS INTO THE LUNGS EVERY DAY 09/23/15   Historical Provider, MD  tamsulosin (FLOMAX) 0.4 MG CAPS capsule Take 0.4 mg by mouth.    Historical Provider, MD  triamcinolone ointment (KENALOG) 0.1 % Apply 1 application topically 2 (two) times daily. 04/08/15   Collie Siad English, PA  VENTOLIN HFA 108 (90 BASE) MCG/ACT inhaler  07/17/14   Historical Provider, MD  zolpidem (AMBIEN) 10 MG tablet Take 10 mg by mouth at bedtime as needed for sleep.    Historical Provider, MD    Family History Family  History  Problem Relation Age of Onset  . Arthritis Mother   . Hypertension Mother   . Heart failure Father   . Diabetes      Social History Social History  Substance Use Topics  . Smoking status: Never Smoker  . Smokeless tobacco: Never Used  . Alcohol use No     Allergies   Penicillins   Review of Systems Review of Systems  Constitutional: Negative.   Musculoskeletal: Negative.   Skin: Positive for wound.  All other systems reviewed and are negative.    Physical Exam Triage Vital Signs ED Triage Vitals  Enc Vitals Group     BP 05/03/16 1325 124/76     Pulse Rate 05/03/16 1325 79     Resp 05/03/16 1325 18     Temp 05/03/16 1325 98.3 F (36.8 C)     Temp Source 05/03/16 1325 Oral     SpO2 05/03/16 1325 98 %     Weight --      Height --      Head Circumference --      Peak Flow --      Pain Score 05/03/16 1332 6     Pain Loc --      Pain Edu? --      Excl. in GC? --    No data found.   Updated Vital Signs BP 124/76 (BP Location: Left Arm)   Pulse 79   Temp 98.3 F (36.8 C) (Oral)   Resp 18   SpO2 98%   Visual Acuity Right Eye Distance:   Left Eye Distance:   Bilateral Distance:    Right Eye Near:   Left Eye Near:    Bilateral Near:     Physical Exam  Constitutional: He is oriented to person, place, and time. He appears well-developed and well-nourished. No distress.  Neurological: He is alert and oriented to person, place, and time.  Skin: Skin is warm and dry.  0.5 mm skin avulsion to dorsum of lif prox to nail, bleeding controlled.  Nursing note and vitals reviewed.    UC Treatments / Results  Labs (all labs ordered are listed, but only abnormal results are displayed) Labs Reviewed - No data to display  EKG  EKG Interpretation None       Radiology No results found.  Procedures Procedures (including critical care time)  Medications Ordered in UC Medications  Tdap (BOOSTRIX) injection 0.5 mL (not administered)      Initial Impression / Assessment and Plan / UC Course  I have reviewed the triage vital signs and the nursing notes.  Pertinent labs & imaging results that were available during my care of  the patient were reviewed by me and considered in my medical decision making (see chart for details).  Clinical Course      Final Clinical Impressions(s) / UC Diagnoses   Final diagnoses:  None    New Prescriptions New Prescriptions   No medications on file     Linna Hoff, MD 05/03/16 1350

## 2016-05-03 NOTE — Discharge Instructions (Signed)
Leave bandaged until mon, keep dry, then may wash as needed and apply bacitracin ointment twice a day as needed, return as needed. You had a tetanus booster.

## 2016-06-10 ENCOUNTER — Other Ambulatory Visit: Payer: Self-pay | Admitting: Cardiovascular Disease

## 2016-06-26 ENCOUNTER — Ambulatory Visit (INDEPENDENT_AMBULATORY_CARE_PROVIDER_SITE_OTHER): Payer: Medicare Other | Admitting: Cardiovascular Disease

## 2016-06-26 ENCOUNTER — Encounter: Payer: Self-pay | Admitting: Cardiovascular Disease

## 2016-06-26 VITALS — BP 118/72 | HR 81 | Ht 69.0 in | Wt 177.0 lb

## 2016-06-26 DIAGNOSIS — I10 Essential (primary) hypertension: Secondary | ICD-10-CM

## 2016-06-26 DIAGNOSIS — E782 Mixed hyperlipidemia: Secondary | ICD-10-CM

## 2016-06-26 DIAGNOSIS — E119 Type 2 diabetes mellitus without complications: Secondary | ICD-10-CM

## 2016-06-26 NOTE — Patient Instructions (Signed)
Dr Croitoru recommends that you schedule a follow-up appointment in 12 months. You will receive a reminder letter in the mail two months in advance. If you don't receive a letter, please call our office to schedule the follow-up appointment.  If you need a refill on your cardiac medications before your next appointment, please call your pharmacy. 

## 2016-06-26 NOTE — Progress Notes (Signed)
Cardiology Office Note    Date:  06/27/2016   ID:  Derek BoatmanFred A [name], DOB Apr 06, 1946, MRN [id number]  PCP:  Derek [name],Derek R, MD  Cardiologist:   Thurmon FairMihai Renezmae Canlas, MD   Chief Complaint  Patient presents with  . Follow-up    History of Present Illness:  Derek [name] is a 70 y.o. male with type 2 diabetes mellitus, hyperlipidemia and hypertension with with evidence of left ventricular hypertrophy and equivocal abnormalities on treadmill stress testing, returning for routine follow-up. He feels great and continues to walk on a daily basis without complaints of exertional dyspnea or angina. Recently had a normal carotid duplex ultrasound study.  He is compliant with medications for diabetes this hemoglobin A1c was excellent at 6%. His LDL is well within target range on statin therapy. His weight is almost in ideal range. His blood pressure is consistently well controlled when he checks it at home.  Past Medical History:  Diagnosis Date  . Anxiety   . Arrhythmia   . Asthma   . Chronic headaches   . COPD (chronic obstructive pulmonary disease) (HCC)   . COPD (chronic obstructive pulmonary disease) (HCC)   . Depression   . Diabetes (HCC)   . Diverticulosis   . GERD (gastroesophageal reflux disease)   . Glaucoma   . Hyperlipidemia   . Hypertension   . IBS (irritable bowel syndrome)   . Pneumonia   . Sleep apnea     Past Surgical History:  Procedure Laterality Date  . LUMBAR SPINE SURGERY  2010  . ROTATOR CUFF REPAIR  2008    Current Medications: Outpatient Medications Prior to Visit  Medication Sig Dispense Refill  . aspirin 81 MG tablet Take 81 mg by mouth daily.    . chlordiazePOXIDE (LIBRIUM) 25 MG capsule Take 25 mg by mouth daily.  1  . esomeprazole (NEXIUM) 20 MG capsule Take 20 mg by mouth 2 (two) times daily.  4  . glucose blood (FREESTYLE LITE) test strip Use as instructed to check blood sugars ONE (1) time per day dx code E11.9 100 each 3  .  HYDROcodone-acetaminophen (NORCO/VICODIN) 5-325 MG per tablet Take 1 tablet by mouth every 6 (six) hours as needed for pain.    Marland Kitchen. irbesartan-hydrochlorothiazide (AVALIDE) 150-12.5 MG per tablet Take 1 tablet by mouth daily.    Marland Kitchen. LUMIGAN 0.01 % SOLN   11  . meclizine (ANTIVERT) 25 MG tablet Take 25 mg by mouth as needed.   4  . metoprolol succinate (TOPROL-XL) 25 MG 24 hr tablet TAKE 1 TABLET BY MOUTH EVERY DAY 30 tablet 0  . potassium chloride (K-DUR,KLOR-CON) 10 MEQ tablet Take 10 mEq by mouth 2 (two) times daily.    . sertraline (ZOLOFT) 100 MG tablet Take 100 mg by mouth daily.    . simvastatin (ZOCOR) 40 MG tablet Take 40 mg by mouth every evening.    . sitaGLIPtin-metformin (JANUMET) 50-1000 MG tablet Take 1 tablet by mouth daily. 30 tablet 3  . SYMBICORT 160-4.5 MCG/ACT inhaler INHALE 2 PUFFS INTO THE LUNGS EVERY DAY  4  . tamsulosin (FLOMAX) 0.4 MG CAPS capsule Take 0.4 mg by mouth.    . triamcinolone ointment (KENALOG) 0.1 % Apply 1 application topically 2 (two) times daily. 30 g 0  . VENTOLIN HFA 108 (90 BASE) MCG/ACT inhaler   4  . zolpidem (AMBIEN) 10 MG tablet Take 10 mg by mouth at bedtime as needed for sleep.    Marland Kitchen. doxycycline (VIBRA-TABS) 100 MG  tablet Take 1 tablet (100 mg total) by mouth 2 (two) times daily. (Patient not taking: Reported on 06/26/2016) 20 tablet 0   No facility-administered medications prior to visit.      Allergies:   Penicillins   Social History   Social History  . Marital status: Married    Spouse name: N/A  . Number of children: 2  . Years of education: N/A   Occupational History  . Retired    Social History Main Topics  . Smoking status: Never Smoker  . Smokeless tobacco: Never Used  . Alcohol use No  . Drug use: No  . Sexual activity: Not Asked   Other Topics Concern  . None   Social History Narrative  . None     Family History:  The patient's family history includes Arthritis in his mother; Heart failure in his father; Hypertension  in his mother.   ROS:   Please see the history of present illness.    ROS All other systems reviewed and are negative.   PHYSICAL EXAM:   VS:  BP 118/72   Pulse 81   Ht 5\' 9"  (1.753 m)   Wt 177 lb (80.3 kg)   BMI 26.14 kg/m    GEN: Well nourished, well developed, in no acute distress  HEENT: normal  Neck: no JVD, carotid bruits, or masses Cardiac: RRR; no murmurs, rubs, or gallops,no edema  Respiratory:  clear to auscultation bilaterally, normal work of breathing GI: soft, nontender, nondistended, + BS MS: no deformity or atrophy  Skin: warm and dry, no rash Neuro:  Alert and Oriented x 3, Strength and sensation are intact Psych: euthymic mood, full affect  Wt Readings from Last 3 Encounters:  06/26/16 177 lb (80.3 kg)  02/14/16 180 lb (81.6 kg)  08/17/15 181 lb 6.4 oz (82.3 kg)      Studies/Labs Reviewed:   EKG:  EKG is ordered today.  The ekg ordered today demonstrates Sinus rhythm, questionable right atrial abnormality, normal tracing. QTC 427 ms  Recent Labs: 02/11/2016: BUN 12; Creatinine, Ser 0.84; Potassium 3.8; Sodium 137   Lipid Panel    Component Value Date/Time   CHOL 133 08/14/2014 1357   TRIG 196.0 (H) 08/14/2014 1357   HDL 40.40 08/14/2014 1357   CHOLHDL 3 08/14/2014 1357   VLDL 39.2 08/14/2014 1357   LDLCALC 53 08/14/2014 1357      ASSESSMENT:    No diagnosis found.   PLAN:  In order of problems listed above:  1. HTN: Excellent control 2. DM: Excellent glycemic control 3. HLP: LDL in target range. Triglycerides are borderline high. He will have labs with Dr. Petra KubaKilpatrick in the next couple of weeks.    Medication Adjustments/Labs and Tests Ordered: Current medicines are reviewed at length with the patient today.  Concerns regarding medicines are outlined above.  Medication changes, Labs and Tests ordered today are listed in the Patient Instructions below. Patient Instructions  Dr Royann Shiversroitoru recommends that you schedule a follow-up  appointment in 12 months. You will receive a reminder letter in the mail two months in advance. If you don't receive a letter, please call our office to schedule the follow-up appointment.  If you need a refill on your cardiac medications before your next appointment, please call your pharmacy.    Signed, Thurmon FairMihai Renesmae Donahey, MD  06/27/2016 5:34 PM    [facility] [address], [place], [place]  [address] Phone: [phone]; Fax: [phone]

## 2016-06-27 ENCOUNTER — Encounter: Payer: Self-pay | Admitting: Cardiovascular Disease

## 2016-07-02 ENCOUNTER — Ambulatory Visit: Payer: Medicare Other | Admitting: Podiatry

## 2016-07-10 ENCOUNTER — Telehealth: Payer: Self-pay | Admitting: Endocrinology

## 2016-07-10 ENCOUNTER — Encounter: Payer: Self-pay | Admitting: Podiatry

## 2016-07-10 ENCOUNTER — Ambulatory Visit (INDEPENDENT_AMBULATORY_CARE_PROVIDER_SITE_OTHER): Payer: Medicare Other | Admitting: Podiatry

## 2016-07-10 VITALS — Ht 69.0 in | Wt 177.0 lb

## 2016-07-10 DIAGNOSIS — E119 Type 2 diabetes mellitus without complications: Secondary | ICD-10-CM | POA: Diagnosis not present

## 2016-07-10 DIAGNOSIS — M79675 Pain in left toe(s): Secondary | ICD-10-CM | POA: Diagnosis not present

## 2016-07-10 DIAGNOSIS — B351 Tinea unguium: Secondary | ICD-10-CM

## 2016-07-10 MED ORDER — SITAGLIPTIN PHOS-METFORMIN HCL 50-1000 MG PO TABS: 1.0000 | ORAL_TABLET | Freq: Every day | ORAL | 3 refills | Status: DC

## 2016-07-10 NOTE — Progress Notes (Signed)
This patient returns to the office preventative foot care services for treatment of his second through fifth toenails both feet. He says the nails have grown long and thick and are painful walking and wearing his shoes. Both surgical sites on the great toes have healed with no drainage or infection or pain noted  GENERAL APPEARANCE: Alert, conversant. Appropriately groomed. No acute distress.  VASCULAR: Pedal pulses palpable at  [facility] and PT bilateral.  Capillary refill time is immediate to all digits,  Normal temperature gradient.    NEUROLOGIC: sensation is normal to 5.07 monofilament at 5/5 sites bilateral.  Light touch is intact bilateral, Muscle strength normal.  MUSCULOSKELETAL: acceptable muscle strength, tone and stability bilateral.  Intrinsic muscluature intact bilateral.  Rectus appearance of foot and digits noted bilateral.   DERMATOLOGIC: skin color, texture, and turgor are within normal limits.  No preulcerative lesions or ulcers  are seen, no interdigital maceration noted.   NAILS  thick disfigured discolored nails both feet  DX  Onychomycosis 2-5  B/L  ROV  debridement and grinding of long thick painful nails bilaterally. Patient was told he can discontinue peroxide washes and finish his antibiotics. Return to the office in 10 weeks for further evaluation and treatment     Helane GuntherGregory Dollie Bressi DPM

## 2016-07-10 NOTE — Telephone Encounter (Signed)
[facility] called in and requested a refill of the Pt's Janumet be sent in.  [facility] is [facility] [address].

## 2016-07-10 NOTE — Telephone Encounter (Signed)
Refill submitted for [facility] to the [facility] on [address].

## 2016-07-22 LAB — HM DIABETES EYE EXAM

## 2016-07-28 ENCOUNTER — Ambulatory Visit (HOSPITAL_COMMUNITY)
Admission: EM | Admit: 2016-07-28 | Discharge: 2016-07-28 | Disposition: A | Discharge status: Home / Self Care | Payer: Medicare Other | Attending: Emergency Medicine | Admitting: Emergency Medicine

## 2016-07-28 ENCOUNTER — Encounter (HOSPITAL_COMMUNITY): Payer: Self-pay | Admitting: *Deleted

## 2016-07-28 ENCOUNTER — Encounter: Payer: Self-pay | Admitting: Endocrinology

## 2016-07-28 DIAGNOSIS — Z7984 Long term (current) use of oral hypoglycemic drugs: Secondary | ICD-10-CM | POA: Diagnosis not present

## 2016-07-28 DIAGNOSIS — J449 Chronic obstructive pulmonary disease, unspecified: Secondary | ICD-10-CM | POA: Insufficient documentation

## 2016-07-28 DIAGNOSIS — N390 Urinary tract infection, site not specified: Secondary | ICD-10-CM | POA: Diagnosis not present

## 2016-07-28 DIAGNOSIS — I10 Essential (primary) hypertension: Secondary | ICD-10-CM | POA: Diagnosis not present

## 2016-07-28 DIAGNOSIS — E119 Type 2 diabetes mellitus without complications: Secondary | ICD-10-CM | POA: Diagnosis not present

## 2016-07-28 DIAGNOSIS — Z7982 Long term (current) use of aspirin: Secondary | ICD-10-CM | POA: Insufficient documentation

## 2016-07-28 DIAGNOSIS — Z79899 Other long term (current) drug therapy: Secondary | ICD-10-CM | POA: Insufficient documentation

## 2016-07-28 DIAGNOSIS — R319 Hematuria, unspecified: Secondary | ICD-10-CM | POA: Diagnosis not present

## 2016-07-28 DIAGNOSIS — R350 Frequency of micturition: Secondary | ICD-10-CM | POA: Diagnosis not present

## 2016-07-28 DIAGNOSIS — E785 Hyperlipidemia, unspecified: Secondary | ICD-10-CM | POA: Diagnosis not present

## 2016-07-28 DIAGNOSIS — R310 Gross hematuria: Secondary | ICD-10-CM | POA: Diagnosis not present

## 2016-07-28 DIAGNOSIS — K219 Gastro-esophageal reflux disease without esophagitis: Secondary | ICD-10-CM | POA: Diagnosis not present

## 2016-07-28 DIAGNOSIS — Z7951 Long term (current) use of inhaled steroids: Secondary | ICD-10-CM | POA: Insufficient documentation

## 2016-07-28 DIAGNOSIS — F329 Major depressive disorder, single episode, unspecified: Secondary | ICD-10-CM | POA: Diagnosis not present

## 2016-07-28 LAB — POCT URINALYSIS DIP (DEVICE)
BILIRUBIN URINE: NEGATIVE
Glucose, UA: NEGATIVE mg/dL
HGB URINE DIPSTICK: NEGATIVE
Ketones, ur: NEGATIVE mg/dL
NITRITE: NEGATIVE
PH: 6 (ref 5.0–8.0)
Protein, ur: NEGATIVE mg/dL
SPECIFIC GRAVITY, URINE: 1.02 (ref 1.005–1.030)
UROBILINOGEN UA: 0.2 mg/dL (ref 0.0–1.0)

## 2016-07-28 MED ORDER — CIPROFLOXACIN HCL 250 MG PO TABS: 250.0000 mg | ORAL_TABLET | Freq: Two times a day (BID) | ORAL | 0 refills | Status: DC

## 2016-07-28 NOTE — ED Provider Notes (Signed)
CSN: [id number]     Arrival date [id number] time 07/28/16  1123 History   First MD Initiated Contact with Patient 07/28/16 1237     Chief Complaint  Patient presents with  . Hematuria   (Consider location/radiation/quality/duration/timing/severity/associated sxs/prior Treatment) Patient with hx of prostate problems is having some urinary frequency and blood in urine for 4 days.   The history is provided by the patient.  Hematuria  This is a new problem. The current episode started more than 2 days ago. The problem occurs constantly. The problem has not changed since onset.Nothing aggravates the symptoms. Nothing relieves the symptoms. He has tried nothing for the symptoms.    Past Medical History:  Diagnosis Date  . Anxiety   . Arrhythmia   . Asthma   . Chronic headaches   . COPD (chronic obstructive pulmonary disease) (HCC)   . COPD (chronic obstructive pulmonary disease) (HCC)   . Depression   . Diabetes (HCC)   . Diverticulosis   . GERD (gastroesophageal reflux disease)   . Glaucoma   . Hyperlipidemia   . Hypertension   . IBS (irritable bowel syndrome)   . Pneumonia   . Sleep apnea    Past Surgical History:  Procedure Laterality Date  . LUMBAR SPINE SURGERY  2010  . ROTATOR CUFF REPAIR  2008   Family History  Problem Relation Age of Onset  . Arthritis Mother   . Hypertension Mother   . Heart failure Father   . Diabetes     Social History  Substance Use Topics  . Smoking status: Never Smoker  . Smokeless tobacco: Never Used  . Alcohol use No    Review of Systems  Constitutional: Negative.   HENT: Negative.   Eyes: Negative.   Respiratory: Negative.   Cardiovascular: Negative.   Gastrointestinal: Negative.   Genitourinary: Positive for hematuria.  Musculoskeletal: Negative.   Allergic/Immunologic: Negative.   Neurological: Negative.     Allergies  Penicillins  Home Medications   Prior to Admission medications   Medication Sig Start Date End Date  Taking? Authorizing Provider  aspirin 81 MG tablet Take 81 mg by mouth daily.    Historical Provider, MD  chlordiazePOXIDE (LIBRIUM) 25 MG capsule Take 25 mg by mouth daily. 08/03/14   Historical Provider, MD  ciprofloxacin (CIPRO) 250 MG tablet Take 1 tablet (250 mg total) by mouth every 12 (twelve) hours. 07/28/16   Deatra CanterWilliam J Oxford, FNP  esomeprazole (NEXIUM) 20 MG capsule Take 20 mg by mouth 2 (two) times daily. 01/04/15   Historical Provider, MD  glucose blood (FREESTYLE LITE) test strip Use as instructed to check blood sugars ONE (1) time per day dx code E11.9 02/15/15   Reather LittlerAjay Kumar, MD  HYDROcodone-acetaminophen (NORCO/VICODIN) 5-325 MG per tablet Take 1 tablet by mouth every 6 (six) hours as needed for pain.    Historical Provider, MD  irbesartan-hydrochlorothiazide (AVALIDE) 150-12.5 MG per tablet Take 1 tablet by mouth daily.    Historical Provider, MD  LUMIGAN 0.01 % SOLN  10/16/14   Historical Provider, MD  meclizine (ANTIVERT) 25 MG tablet Take 25 mg by mouth as needed.  01/08/15   Historical Provider, MD  metoprolol succinate (TOPROL-XL) 25 MG 24 hr tablet TAKE 1 TABLET BY MOUTH EVERY DAY 06/12/16   Mihai Croitoru, MD  potassium chloride (K-DUR,KLOR-CON) 10 MEQ tablet Take 10 mEq by mouth 2 (two) times daily.    Historical Provider, MD  sertraline (ZOLOFT) 100 MG tablet Take 100 mg by mouth daily.  Historical Provider, MD  simvastatin (ZOCOR) 40 MG tablet Take 40 mg by mouth every evening.    Historical Provider, MD  sitaGLIPtin-metformin (JANUMET) 50-1000 MG tablet Take 1 tablet by mouth daily. 07/10/16   Reather Littler, MD  SYMBICORT 160-4.5 MCG/ACT inhaler INHALE 2 PUFFS INTO THE LUNGS EVERY DAY 09/23/15   Historical Provider, MD  tamsulosin (FLOMAX) 0.4 MG CAPS capsule Take 0.4 mg by mouth.    Historical Provider, MD  triamcinolone ointment (KENALOG) 0.1 % Apply 1 application topically 2 (two) times daily. 04/08/15   Collie Siad English, PA  VENTOLIN HFA 108 (90 BASE) MCG/ACT inhaler  07/17/14    Historical Provider, MD  zolpidem (AMBIEN) 10 MG tablet Take 10 mg by mouth at bedtime as needed for sleep.    Historical Provider, MD   Meds Ordered and Administered this Visit  Medications - No data to display  BP 132/78 (BP Location: Left Arm)   Pulse 78   Temp 98.6 F (37 C)   Resp 18   SpO2 100%  No data found.   Physical Exam  Constitutional: He appears well-developed and well-nourished.  HENT:  Head: Normocephalic and atraumatic.  Eyes: Conjunctivae and EOM are normal. Pupils are equal, round, and reactive to light.  Neck: Normal range of motion. Neck supple.  Cardiovascular: Normal rate and regular rhythm.   Pulmonary/Chest: Effort normal and breath sounds normal.  Nursing note and vitals reviewed.   Urgent Care Course   Clinical Course     Procedures (including critical care time)  Labs Review Labs Reviewed  POCT URINALYSIS DIP (DEVICE) - Abnormal; Notable for the following:       Result Value   Leukocytes, UA TRACE (*)    All other components within normal limits    Imaging Review No results found.   Visual Acuity Review  Right Eye Distance:   Left Eye Distance:   Bilateral Distance:    Right Eye Near:   Left Eye Near:    Bilateral Near:         MDM   1. Hematuria, gross   2. Urinary tract infection with hematuria, site unspecified    UA cx cipro 250mg  one po bid x 10 days      Deatra Canter, FNP 07/28/16 1315

## 2016-07-28 NOTE — ED Triage Notes (Addendum)
Tuesday   Weds     Thursday    Of last  Week     Frequency       4   Days  Ago  Developed  Fever       Took  Enema         3  Days     Ago       yest   Had   Difficulty    Last  Night  Noticed   Blood        Pink    Tinged  Blood  In  Urine        Took   Another enema  Over  The  Weekend    With results

## 2016-07-30 ENCOUNTER — Other Ambulatory Visit: Payer: Self-pay | Admitting: Cardiovascular Disease

## 2016-07-30 LAB — URINE CULTURE: Culture: >=100000 — AB

## 2016-08-01 NOTE — Telephone Encounter (Signed)
Rx(s) sent to pharmacy electronically.  

## 2016-08-03 ENCOUNTER — Encounter: Payer: Self-pay | Admitting: Endocrinology

## 2016-08-12 ENCOUNTER — Other Ambulatory Visit (INDEPENDENT_AMBULATORY_CARE_PROVIDER_SITE_OTHER): Payer: Medicare Other

## 2016-08-12 DIAGNOSIS — E119 Type 2 diabetes mellitus without complications: Secondary | ICD-10-CM

## 2016-08-12 LAB — LIPID PANEL
CHOLESTEROL: 119 mg/dL (ref 0–200)
HDL: 36.4 mg/dL — ABNORMAL LOW (ref >39.00)
LDL CALC: 51 mg/dL (ref 0–99)
NonHDL: 82.89
TRIGLYCERIDES: 159 mg/dL — AB (ref 0.0–149.0)
Total CHOL/HDL Ratio: 3
VLDL: 31.8 mg/dL (ref 0.0–40.0)

## 2016-08-12 LAB — COMPREHENSIVE METABOLIC PANEL
ALK PHOS: 93 U/L (ref 39–117)
ALT: 13 U/L (ref 0–53)
AST: 15 U/L (ref 0–37)
Albumin: 4.2 g/dL (ref 3.5–5.2)
BUN: 14 mg/dL (ref 6–23)
CO2: 30 mEq/L (ref 19–32)
CREATININE: 0.88 mg/dL (ref 0.40–1.50)
Calcium: 9.4 mg/dL (ref 8.4–10.5)
Chloride: 104 mEq/L (ref 96–112)
GFR: 109.51 mL/min (ref >60.00)
GLUCOSE: 90 mg/dL (ref 70–99)
POTASSIUM: 3.9 meq/L (ref 3.5–5.1)
SODIUM: 137 meq/L (ref 135–145)
TOTAL PROTEIN: 7.8 g/dL (ref 6.0–8.3)
Total Bilirubin: 0.4 mg/dL (ref 0.2–1.2)

## 2016-08-12 LAB — MICROALBUMIN / CREATININE URINE RATIO
Creatinine,U: 48.5 mg/dL
Microalb Creat Ratio: 1.4 mg/g (ref 0.0–30.0)
Microalb, Ur: <0.7 mg/dL (ref 0.0–1.9)

## 2016-08-12 LAB — HEMOGLOBIN A1C: HEMOGLOBIN A1C: 6.1 % (ref 4.6–6.5)

## 2016-08-15 ENCOUNTER — Ambulatory Visit: Payer: Medicare Other | Admitting: Endocrinology

## 2016-08-18 ENCOUNTER — Ambulatory Visit (INDEPENDENT_AMBULATORY_CARE_PROVIDER_SITE_OTHER): Payer: Medicare Other | Admitting: Endocrinology

## 2016-08-18 ENCOUNTER — Encounter: Payer: Self-pay | Admitting: Endocrinology

## 2016-08-18 VITALS — BP 128/74 | HR 93 | Ht 69.0 in | Wt 173.0 lb

## 2016-08-18 DIAGNOSIS — E119 Type 2 diabetes mellitus without complications: Secondary | ICD-10-CM

## 2016-08-18 MED ORDER — SITAGLIPTIN PHOS-METFORMIN HCL 50-1000 MG PO TABS: 1.0000 | ORAL_TABLET | Freq: Every day | ORAL | 5 refills | Status: DC

## 2016-08-18 NOTE — Progress Notes (Signed)
Patient ID: Derek [name], male   DOB: 01-22-1946, 71 y.o.   MRN: [id number]           Reason for Appointment: Diabetes follow-up   History of Present Illness   Diagnosis: Type 2 DIABETES MELITUS, date of diagnosis: 2008      Past history: Although his glucose was 600 at diagnosis he was initially treated with metformin and subsequently had relatively milder diabetes Januvia was added to his regimen in 2011  He has had relatively mild diabetes which is consistently well controlled with regimen of Janumet 50/1000 once daily  He now says he takes this at bedtime  Usually has upper normal A1c consistently and now 6.1% He has checked blood sugar at breakfast and occasionally at lunch but not before or after supper  His weight has come down since last year Generally watching diet Sometimes will have 2 starches such as grits and toast in the morning, does have eggs for protein Also he is trying to walk 3 times a week, less during very cold days   Side effects from medications: None      Monitors blood glucose:  less than once a day.    Glucometer:  FreeStyle            Blood Glucose readings:   PRE-MEAL Fasting Lunch Dinner Bedtime Overall  Glucose range: 102-119 69, 83     Mean/median: 106    96     HYPOGLYCEMIA: He denies any symptoms and lowest blood Sugar being 69 at lunch This was if symptomatic  Physical activity: exercise: walking 3/7 days, around 2 miles usually, weather permitting            Dietician visit: Most recent: Unknown           Wt Readings from Last 3 Encounters:  08/18/16 173 lb (78.5 kg)  07/10/16 177 lb (80.3 kg)  06/26/16 177 lb (80.3 kg)   Lab Results  Component Value Date   HGBA1C 6.1 08/12/2016   HGBA1C 6.0 02/11/2016   HGBA1C 6.1 08/13/2015   Lab Results  Component Value Date   MICROALBUR <0.7 08/12/2016   LDLCALC 51 08/12/2016   CREATININE 0.88 08/12/2016    Lab on 08/12/2016  Component Date Value Ref Range Status  . Hgb A1c MFr Bld  08/12/2016 6.1  4.6 - 6.5 % Final  . Sodium 08/12/2016 137  135 - 145 mEq/L Final  . Potassium 08/12/2016 3.9  3.5 - 5.1 mEq/L Final  . Chloride 08/12/2016 104  96 - 112 mEq/L Final  . CO2 08/12/2016 30  19 - 32 mEq/L Final  . Glucose, Bld 08/12/2016 90  70 - 99 mg/dL Final  . BUN 95/62/1308 14  6 - 23 mg/dL Final  . Creatinine, Ser 08/12/2016 0.88  0.40 - 1.50 mg/dL Final  . Total Bilirubin 08/12/2016 0.4  0.2 - 1.2 mg/dL Final  . Alkaline Phosphatase 08/12/2016 93  39 - 117 U/L Final  . AST 08/12/2016 15  0 - 37 U/L Final  . ALT 08/12/2016 13  0 - 53 U/L Final  . Total Protein 08/12/2016 7.8  6.0 - 8.3 g/dL Final  . Albumin 65/78/4696 4.2  3.5 - 5.2 g/dL Final  . Calcium 29/52/8413 9.4  8.4 - 10.5 mg/dL Final  . GFR 24/40/1027 109.51  >60.00 mL/min Final  . Microalb, Ur 08/12/2016 <0.7  0.0 - 1.9 mg/dL Final  . Creatinine,U 25/36/6440 48.5  mg/dL Final  . Microalb Creat Ratio 08/12/2016 1.4  0.0 - 30.0 mg/g Final  . Cholesterol 08/12/2016 119  0 - 200 mg/dL Final  . Triglycerides 08/12/2016 159.0* 0.0 - 149.0 mg/dL Final  . HDL 16/10/960401/30/2018 36.40* >39.00 mg/dL Final  . VLDL 54/09/811901/30/2018 31.8  0.0 - 40.0 mg/dL Final  . LDL Cholesterol 08/12/2016 51  0 - 99 mg/dL Final  . Total CHOL/HDL Ratio 08/12/2016 3   Final  . NonHDL 08/12/2016 82.89   Final    Allergies as of 08/18/2016      Reactions   Penicillins Rash      Medication List       Accurate as of 08/18/16  3:27 PM. Always use your most recent med list.          aspirin 81 MG tablet Take 81 mg by mouth daily.   chlordiazePOXIDE 25 MG capsule Commonly known as:  LIBRIUM Take 25 mg by mouth daily.   ciprofloxacin 250 MG tablet Commonly known as:  CIPRO Take 1 tablet (250 mg total) by mouth every 12 (twelve) hours.   esomeprazole 20 MG capsule Commonly known as:  NEXIUM Take 20 mg by mouth 2 (two) times daily.   glucose blood test strip Commonly known as:  FREESTYLE LITE Use as instructed to check blood sugars ONE  (1) time per day dx code E11.9   HYDROcodone-acetaminophen 5-325 MG tablet Commonly known as:  NORCO/VICODIN Take 1 tablet by mouth every 6 (six) hours as needed for pain.   irbesartan-hydrochlorothiazide 150-12.5 MG tablet Commonly known as:  AVALIDE Take 1 tablet by mouth daily.   LUMIGAN 0.01 % Soln Generic drug:  bimatoprost   meclizine 25 MG tablet Commonly known as:  ANTIVERT Take 25 mg by mouth as needed.   metoprolol succinate 25 MG 24 hr tablet Commonly known as:  TOPROL-XL TAKE 1 TABLET BY MOUTH EVERY DAY   potassium chloride 10 MEQ tablet Commonly known as:  K-DUR,KLOR-CON Take 10 mEq by mouth 2 (two) times daily.   sertraline 100 MG tablet Commonly known as:  ZOLOFT Take 100 mg by mouth daily.   simvastatin 40 MG tablet Commonly known as:  ZOCOR Take 40 mg by mouth every evening.   sitaGLIPtin-metformin 50-1000 MG tablet Commonly known as:  JANUMET Take 1 tablet by mouth daily.   SYMBICORT 160-4.5 MCG/ACT inhaler Generic drug:  budesonide-formoterol INHALE 2 PUFFS INTO THE LUNGS EVERY DAY   tamsulosin 0.4 MG Caps capsule Commonly known as:  FLOMAX Take 0.4 mg by mouth.   triamcinolone ointment 0.1 % Commonly known as:  KENALOG Apply 1 application topically 2 (two) times daily.   VENTOLIN HFA 108 (90 Base) MCG/ACT inhaler Generic drug:  albuterol   zolpidem 10 MG tablet Commonly known as:  AMBIEN Take 10 mg by mouth at bedtime as needed for sleep.       Allergies:  Allergies  Allergen Reactions  . Penicillins Rash    Past Medical History:  Diagnosis Date  . Anxiety   . Arrhythmia   . Asthma   . Chronic headaches   . COPD (chronic obstructive pulmonary disease) (HCC)   . COPD (chronic obstructive pulmonary disease) (HCC)   . Depression   . Diabetes (HCC)   . Diverticulosis   . GERD (gastroesophageal reflux disease)   . Glaucoma   . Hyperlipidemia   . Hypertension   . IBS (irritable bowel syndrome)   . Pneumonia   . Sleep  apnea     Past Surgical History:  Procedure Laterality Date  . LUMBAR  SPINE SURGERY  2010  . ROTATOR CUFF REPAIR  2008    Family History  Problem Relation Age of Onset  . Arthritis Mother   . Hypertension Mother   . Heart failure Father   . Diabetes      Social History:  reports that he has never smoked. He has never used smokeless tobacco. He reports that he does not drink alcohol or use drugs.  Review of Systems:  HYPERTENSION:   well controlled with Avalide   On Toprol 25 mg from cardiologist  HYPERLIPIDEMIA: The lipid abnormality consists of elevated LDL and low HDL treated with simvastatin 40 LDL is excellent but HDL again relatively low   Lab Results  Component Value Date   CHOL 119 08/12/2016   HDL 36.40 (L) 08/12/2016   LDLCALC 51 08/12/2016   TRIG 159.0 (H) 08/12/2016   CHOLHDL 3 08/12/2016    Diabetic foot exam 2/18   Examination:   BP 128/74   Pulse 93   Ht 5\' 9"  (1.753 m)   Wt 173 lb (78.5 kg)   SpO2 97%   BMI 25.55 kg/m   Body mass index is 25.55 kg/m.    Diabetic Foot Exam - Simple   Simple Foot Form Diabetic Foot exam was performed with the following findings:  Yes   Visual Inspection No deformities, no ulcerations, no other skin breakdown bilaterally:  Yes Sensation Testing Intact to touch and monofilament testing bilaterally:  Yes Pulse Check Posterior Tibialis and Dorsalis pulse intact bilaterally:  Yes Comments       ASSESSMENT/ PLAN:   Diabetes type 2  See history of present illness for  discussion of current diabetes management, blood sugar patterns and problems identified  The patient's diabetes control is good with only low dose Janumet He has lost weight since last year and is generally compliant with diet and exercise His fasting readings are slightly above normal but he is not monitoring after meals  He can continue the 50/1000 Janumet but advised him to change this to suppertime instead of bedtime for better  tolerability and better control of postprandial readings Also needs to cut back some on the carbohydrate at breakfast and have only 1 starch  Hyperlipidemia:  Still well controlled with excellent LDL but continues to have low HDL  Hypertension: Well controlled on Avalide, also monitored by PCP  He will follow-up in 6 months again    [facility] 08/18/2016, 3:27 PM

## 2016-08-18 NOTE — Patient Instructions (Signed)
Check blood sugars on waking up 2-3x per weekly  Also check blood sugars about 2 hours after a meal and do this after different meals by rotation  Recommended blood sugar levels on waking up is 80-120 and about 2 hours after meal is 130-160  Please bring your blood sugar monitor to each visit, thank you   Change Janumet to SUPPER time

## 2016-09-18 ENCOUNTER — Ambulatory Visit (INDEPENDENT_AMBULATORY_CARE_PROVIDER_SITE_OTHER): Payer: Medicare Other | Admitting: Podiatry

## 2016-09-18 ENCOUNTER — Encounter: Payer: Self-pay | Admitting: Podiatry

## 2016-09-18 DIAGNOSIS — B351 Tinea unguium: Secondary | ICD-10-CM

## 2016-09-18 DIAGNOSIS — M79675 Pain in left toe(s): Secondary | ICD-10-CM

## 2016-09-18 DIAGNOSIS — E119 Type 2 diabetes mellitus without complications: Secondary | ICD-10-CM

## 2016-09-18 NOTE — Progress Notes (Signed)
This patient returns to the office preventative foot care services for treatment of his second through fifth toenails both feet. He says the nails have grown long and thick and are painful walking and wearing his shoes. Both surgical sites on the great toes have healed with no drainage or infection or pain noted  GENERAL APPEARANCE: Alert, conversant. Appropriately groomed. No acute distress.  VASCULAR: Pedal pulses palpable at  DP and PT bilateral.  Capillary refill time is immediate to all digits,  Normal temperature gradient.    NEUROLOGIC: sensation is normal to 5.07 monofilament at 5/5 sites bilateral.  Light touch is intact bilateral, Muscle strength normal.  MUSCULOSKELETAL: acceptable muscle strength, tone and stability bilateral.  Intrinsic muscluature intact bilateral.  Rectus appearance of foot and digits noted bilateral.   DERMATOLOGIC: skin color, texture, and turgor are within normal limits.  No preulcerative lesions or ulcers  are seen, no interdigital maceration noted.   NAILS  thick disfigured discolored nails both feet  DX  Onychomycosis 2-5  B/L  ROV  debridement and grinding of long thick painful nails bilaterally. Patient was told he can discontinue peroxide washes and finish his antibiotics. Return to the office in 10 weeks for further evaluation and treatment     Rajesh Wyss DPM   

## 2016-11-13 ENCOUNTER — Encounter: Payer: Self-pay | Admitting: Internal Medicine

## 2016-12-10 ENCOUNTER — Encounter: Payer: Self-pay | Admitting: Podiatry

## 2016-12-10 ENCOUNTER — Ambulatory Visit (INDEPENDENT_AMBULATORY_CARE_PROVIDER_SITE_OTHER): Payer: Medicare Other | Admitting: Podiatry

## 2016-12-10 DIAGNOSIS — M79675 Pain in left toe(s): Secondary | ICD-10-CM

## 2016-12-10 DIAGNOSIS — B351 Tinea unguium: Secondary | ICD-10-CM | POA: Diagnosis not present

## 2016-12-10 DIAGNOSIS — E119 Type 2 diabetes mellitus without complications: Secondary | ICD-10-CM

## 2016-12-10 NOTE — Progress Notes (Signed)
This patient returns to the office preventative foot care services for treatment of his second through fifth toenails both feet. He says the nails have grown long and thick and are painful walking and wearing his shoes. Both surgical sites on the great toes have healed with no drainage or infection or pain noted  GENERAL APPEARANCE: Alert, conversant. Appropriately groomed. No acute distress.  VASCULAR: Pedal pulses palpable at  DP and PT bilateral.  Capillary refill time is immediate to all digits,  Normal temperature gradient.    NEUROLOGIC: sensation is normal to 5.07 monofilament at 5/5 sites bilateral.  Light touch is intact bilateral, Muscle strength normal.  MUSCULOSKELETAL: acceptable muscle strength, tone and stability bilateral.  Intrinsic muscluature intact bilateral.  Rectus appearance of foot and digits noted bilateral.   DERMATOLOGIC: skin color, texture, and turgor are within normal limits.  No preulcerative lesions or ulcers  are seen, no interdigital maceration noted.   NAILS  thick disfigured discolored nails both feet  DX  Onychomycosis 2-5  B/L  ROV  debridement and grinding of long thick painful nails bilaterally. Patient was told he can discontinue peroxide washes and finish his antibiotics. Return to the office in 10 weeks for further evaluation and treatment     Jessyca Sloan DPM   

## 2016-12-18 ENCOUNTER — Ambulatory Visit: Payer: Medicare Other | Admitting: *Deleted

## 2016-12-18 VITALS — Ht 67.0 in | Wt 179.0 lb

## 2016-12-18 DIAGNOSIS — Z1211 Encounter for screening for malignant neoplasm of colon: Secondary | ICD-10-CM

## 2016-12-18 MED ORDER — SUPREP BOWEL PREP KIT 17.5-3.13-1.6 GM/177ML PO SOLN: 1.0000 | Freq: Once | ORAL | 0 refills | Status: AC

## 2016-12-18 NOTE — Progress Notes (Signed)
Patient denies any allergies to egg or soy products. Patient denies complications with anesthesia/sedation.  Patient denies oxygen use at home and denies diet medications.   

## 2017-01-01 ENCOUNTER — Encounter: Payer: Self-pay | Admitting: Internal Medicine

## 2017-01-01 ENCOUNTER — Ambulatory Visit (AMBULATORY_SURGERY_CENTER): Payer: Medicare Other | Admitting: Internal Medicine

## 2017-01-01 VITALS — BP 132/76 | HR 59 | Temp 96.8°F | Resp 16 | Ht 67.0 in | Wt 179.0 lb

## 2017-01-01 DIAGNOSIS — Z1212 Encounter for screening for malignant neoplasm of rectum: Secondary | ICD-10-CM

## 2017-01-01 DIAGNOSIS — Z1211 Encounter for screening for malignant neoplasm of colon: Secondary | ICD-10-CM | POA: Diagnosis not present

## 2017-01-01 MED ORDER — SODIUM CHLORIDE 0.9 % IV SOLN: 500.0000 mL | INTRAVENOUS | Status: DC

## 2017-01-01 NOTE — Patient Instructions (Signed)
YOU HAD AN ENDOSCOPIC PROCEDURE TODAY AT THE [facility]:   Refer to the procedure report that was given to you for any specific questions about what was found during the examination.  If the procedure report does not answer your questions, please call your gastroenterologist to clarify.  If you requested that your care partner not be given the details of your procedure findings, then the procedure report has been included in a sealed envelope for you to review at your convenience later.  YOU SHOULD EXPECT: Some feelings of bloating in the abdomen. Passage of more gas than usual.  Walking can help get rid of the air that was put into your GI tract during the procedure and reduce the bloating. If you had a lower endoscopy (such as a colonoscopy or flexible sigmoidoscopy) you may notice spotting of blood in your stool or on the toilet paper. If you underwent a bowel prep for your procedure, you may not have a normal bowel movement for a few days.  Please Note:  You might notice some irritation and congestion in your nose or some drainage.  This is from the oxygen used during your procedure.  There is no need for concern and it should clear up in a day or so.  SYMPTOMS TO REPORT IMMEDIATELY:   Following lower endoscopy (colonoscopy or flexible sigmoidoscopy):  Excessive amounts of blood in the stool  Significant tenderness or worsening of abdominal pains  Swelling of the abdomen that is new, acute  Fever of 100F or higher  For urgent or emergent issues, a gastroenterologist can be reached at any hour by calling [phone].   DIET:  We do recommend a small meal at first, but then you may proceed to your regular diet.  Drink plenty of fluids but you should avoid alcoholic beverages for 24 hours.  MEDICATIONS: Continue present medications.  Please see handouts given to you by your recovery nurse.  ACTIVITY:  You should plan to take it easy for the rest of today and you should NOT  DRIVE or use heavy machinery until tomorrow (because of the sedation medicines used during the test).    FOLLOW UP: Our staff will call the number listed on your records the next business day following your procedure to check on you and address any questions or concerns that you may have regarding the information given to you following your procedure. If we do not reach you, we will leave a message.  However, if you are feeling well and you are not experiencing any problems, there is no need to return our call.  We will assume that you have returned to your regular daily activities without incident.  If any biopsies were taken you will be contacted by phone or by letter within the next 1-3 weeks.  Please call us at [phone] if you have not heard about the biopsies in 3 weeks.   Thank you for allowing us to provide for your healthcare needs today.   SIGNATURES/CONFIDENTIALITY: You and/or your care partner have signed paperwork which will be entered into your electronic medical record.  These signatures attest to the fact that that the information above on your After Visit Summary has been reviewed and is understood.  Full responsibility of the confidentiality of this discharge information lies with you and/or your care-partner. 

## 2017-01-01 NOTE — Op Note (Addendum)
[place] Patient Name: Derek [name] Procedure Date: 01/01/2017 10:28 AM MRN: [id number] Endoscopist: Derek [name] , MD Age: 71 Referring MD:  Date of Birth: 1945/10/20 Gender: Male Account #: [id number] Procedure:                Colonoscopy Indications:              Screening for colorectal malignant neoplasm, Last                            colonoscopy 10 years ago Medicines:                Monitored Anesthesia Care Procedure:                Pre-Anesthesia Assessment:                           - Prior to the procedure, a History and Physical                            was performed, and patient medications and                            allergies were reviewed. The patient's tolerance of                            previous anesthesia was also reviewed. The risks                            and benefits of the procedure and the sedation                            options and risks were discussed with the patient.                            All questions were answered, and informed consent                            was obtained. Prior Anticoagulants: The patient has                            taken no previous anticoagulant or antiplatelet                            agents. ASA Grade Assessment: III - A patient with                            severe systemic disease. After reviewing the risks                            and benefits, the patient was deemed in                            satisfactory condition to undergo the procedure.  After obtaining informed consent, the colonoscope                            was passed under direct vision. Throughout the                            procedure, the patient's blood pressure, pulse, and                            oxygen saturations were monitored continuously. The                            Colonoscope was introduced through the anus and                            advanced to the the cecum, identified by                             appendiceal orifice and ileocecal valve. The                            colonoscopy was performed without difficulty. The                            patient tolerated the procedure well. The quality                            of the bowel preparation was good. The ileocecal                            valve, appendiceal orifice, and rectum were                            photographed. Scope In: 10:37:18 AM Scope Out: 10:49:50 AM Scope Withdrawal Time: 0 hours 7 minutes 55 seconds  Total Procedure Duration: 0 hours 12 minutes 32 seconds  Findings:                 The digital rectal exam was normal.                           Many small and large-mouthed diverticula were found                            in the sigmoid colon, descending colon and                            ascending colon.                           Internal hemorrhoids were found during                            retroflexion. The hemorrhoids were small.  The exam was otherwise without abnormality. Complications:            No immediate complications. Estimated Blood Loss:     Estimated blood loss: none. Impression:               - Severe diverticulosis in the sigmoid colon, in                            the descending colon and in the ascending colon.                           - Internal hemorrhoids.                           - The examination was otherwise normal.                           - No specimens collected. Recommendation:           - Patient has a contact number available for                            emergencies. The signs and symptoms of potential                            delayed complications were discussed with the                            patient. Return to normal activities tomorrow.                            Written discharge instructions were provided to the                            patient.                           - Resume previous diet.                            - Continue present medications.                           - No recommendation at this time regarding repeat                            colonoscopy due to age. Colonoscopy generally stops                            around age 71 for screening. Will leave the                            decision for repeat screening to your primary care                            provider at that time. Derek FiedlerJay M [name], MD 01/01/2017 10:54:21 AM This  report has been signed electronically.

## 2017-01-01 NOTE — Progress Notes (Signed)
Report given to PACU, vss 

## 2017-01-01 NOTE — Progress Notes (Signed)
Pt's states no medical or surgical changes since previsit or office visit. 

## 2017-01-02 ENCOUNTER — Telehealth: Payer: Self-pay | Admitting: *Deleted

## 2017-01-02 NOTE — Telephone Encounter (Signed)
  Follow up Call-  Call back number 01/01/2017 07/28/2014  Post procedure Call Back phone  # [phone]  Permission to leave phone message Yes Yes  Some recent data might be hidden     Patient questions:  Do you have a fever, pain , or abdominal swelling? No. Pain Score  0 *  Have you tolerated food without any problems? Yes.    Have you been able to return to your normal activities? Yes.    Do you have any questions about your discharge instructions: Diet   No. Medications  No. Follow up visit  No.  Do you have questions or concerns about your Care? No.  Actions: * If pain score is 4 or above: No action needed, pain <4.

## 2017-01-07 ENCOUNTER — Other Ambulatory Visit: Payer: Self-pay | Admitting: Endocrinology

## 2017-01-26 ENCOUNTER — Other Ambulatory Visit: Payer: Self-pay

## 2017-01-26 MED ORDER — GLUCOSE BLOOD VI STRP: ORAL_STRIP | 3 refills | Status: DC

## 2017-01-28 ENCOUNTER — Other Ambulatory Visit: Payer: Self-pay

## 2017-01-28 MED ORDER — GLUCOSE BLOOD VI STRP: ORAL_STRIP | 3 refills | Status: DC

## 2017-01-29 ENCOUNTER — Other Ambulatory Visit: Payer: Self-pay

## 2017-01-29 ENCOUNTER — Telehealth: Payer: Self-pay | Admitting: Endocrinology

## 2017-01-29 MED ORDER — GLUCOSE BLOOD VI STRP: ORAL_STRIP | 3 refills | Status: DC

## 2017-01-29 MED ORDER — GLUCOSE BLOOD VI STRP
ORAL_STRIP | 3 refills | Status: DC
Start: 2017-01-29 — End: 2017-01-29

## 2017-01-29 NOTE — Telephone Encounter (Signed)
Called patient and left a voice message that [facility] [address]

## 2017-01-29 NOTE — Telephone Encounter (Signed)
**  Remind patient they can make refill requests via MyChart**  Medication refill request (Name & Dosage):  glucose blood (FREESTYLE LITE) test strip  Preferred pharmacy (Name & Address):  [id number] [place] - [address] [phone] (Phone) [phone] (Fax)   Other comments (if applicable):   Needs to be filled today, patient goes out of town on tomorrow morning. Pharmacy faxed rx request last Friday per patient

## 2017-02-16 ENCOUNTER — Other Ambulatory Visit: Payer: Self-pay | Admitting: *Deleted

## 2017-02-16 MED ORDER — METOPROLOL SUCCINATE ER 25 MG PO TB24: 25.0000 mg | ORAL_TABLET | Freq: Every day | ORAL | 1 refills | Status: DC

## 2017-02-17 ENCOUNTER — Ambulatory Visit (INDEPENDENT_AMBULATORY_CARE_PROVIDER_SITE_OTHER): Payer: Medicare Other | Admitting: Podiatry

## 2017-02-17 ENCOUNTER — Encounter: Payer: Self-pay | Admitting: Podiatry

## 2017-02-17 DIAGNOSIS — B351 Tinea unguium: Secondary | ICD-10-CM | POA: Diagnosis not present

## 2017-02-17 DIAGNOSIS — M79675 Pain in left toe(s): Secondary | ICD-10-CM | POA: Diagnosis not present

## 2017-02-17 DIAGNOSIS — E119 Type 2 diabetes mellitus without complications: Secondary | ICD-10-CM

## 2017-02-17 NOTE — Progress Notes (Signed)
This patient returns to the office preventative foot care services for treatment of his second through fifth toenails both feet. He says the nails have grown long and thick and are painful walking and wearing his shoes. Both surgical sites on the great toes have healed with no drainage or infection or pain noted  GENERAL APPEARANCE: Alert, conversant. Appropriately groomed. No acute distress.  VASCULAR: [facility].  Capillary refill time is immediate to all digits,  Normal temperature gradient.    NEUROLOGIC: sensation is normal to 5.07 monofilament at 5/5 sites bilateral.  Light touch is intact bilateral, Muscle strength normal.  MUSCULOSKELETAL: acceptable muscle strength, tone and stability bilateral.  Intrinsic muscluature intact bilateral.  Rectus appearance of foot and digits noted bilateral.   DERMATOLOGIC: skin color, texture, and turgor are within normal limits.  No preulcerative lesions or ulcers  are seen, no interdigital maceration noted.   NAILS  thick disfigured discolored nails both feet  DX  Onychomycosis 2-5  B/L  ROV  debridement and grinding of long thick painful nails bilaterally. Patient was told he can discontinue peroxide washes and finish his antibiotics. Return to the office in 10 weeks for further evaluation and treatment     Helane GuntherGregory Carmell Elgin DPM

## 2017-03-03 ENCOUNTER — Other Ambulatory Visit (HOSPITAL_COMMUNITY): Payer: Self-pay | Admitting: Pulmonary Disease

## 2017-03-03 DIAGNOSIS — R1310 Dysphagia, unspecified: Secondary | ICD-10-CM

## 2017-03-10 ENCOUNTER — Encounter (HOSPITAL_COMMUNITY): Payer: Self-pay

## 2017-03-10 ENCOUNTER — Ambulatory Visit (HOSPITAL_COMMUNITY)
Admission: RE | Admit: 2017-03-10 | Discharge: 2017-03-10 | Disposition: A | Discharge status: Home / Self Care | Payer: Medicare Other | Source: Ambulatory Visit | Attending: Pulmonary Disease | Admitting: Pulmonary Disease

## 2017-03-10 DIAGNOSIS — R1310 Dysphagia, unspecified: Secondary | ICD-10-CM | POA: Diagnosis present

## 2017-03-10 DIAGNOSIS — K449 Diaphragmatic hernia without obstruction or gangrene: Secondary | ICD-10-CM | POA: Diagnosis not present

## 2017-04-29 ENCOUNTER — Encounter: Payer: Self-pay | Admitting: Podiatry

## 2017-04-29 ENCOUNTER — Ambulatory Visit (INDEPENDENT_AMBULATORY_CARE_PROVIDER_SITE_OTHER): Payer: Medicare Other | Admitting: Podiatry

## 2017-04-29 VITALS — BP 131/76 | HR 83

## 2017-04-29 DIAGNOSIS — M79675 Pain in left toe(s): Secondary | ICD-10-CM | POA: Diagnosis not present

## 2017-04-29 DIAGNOSIS — B351 Tinea unguium: Secondary | ICD-10-CM | POA: Diagnosis not present

## 2017-04-29 DIAGNOSIS — E119 Type 2 diabetes mellitus without complications: Secondary | ICD-10-CM | POA: Diagnosis not present

## 2017-04-29 NOTE — Progress Notes (Signed)
This patient returns to the office preventative foot care services for treatment of his second through fifth toenails both feet. He says the nails have grown long and thick and are painful walking and wearing his shoes. Both surgical sites on the great toes have healed with no drainage or infection or pain noted  GENERAL APPEARANCE: Alert, conversant. Appropriately groomed. No acute distress.  VASCULAR: Pedal pulses palpable at  DP and PT bilateral.  Capillary refill time is immediate to all digits,  Normal temperature gradient.    NEUROLOGIC: sensation is normal to 5.07 monofilament at 5/5 sites bilateral.  Light touch is intact bilateral, Muscle strength normal.  MUSCULOSKELETAL: acceptable muscle strength, tone and stability bilateral.  Intrinsic muscluature intact bilateral.  Rectus appearance of foot and digits noted bilateral.   DERMATOLOGIC: skin color, texture, and turgor are within normal limits.  No preulcerative lesions or ulcers  are seen, no interdigital maceration noted.   NAILS  thick disfigured discolored nails both feet  DX  Onychomycosis 2-5  B/L  ROV  debridement and grinding of long thick painful nails bilaterally. Patient was told he can discontinue peroxide washes and finish his antibiotics. Return to the office in 10 weeks for further evaluation and treatment     Addi Pak DPM   

## 2017-05-18 ENCOUNTER — Other Ambulatory Visit: Payer: Self-pay | Admitting: Endocrinology

## 2017-07-24 ENCOUNTER — Other Ambulatory Visit: Payer: Self-pay

## 2017-07-30 ENCOUNTER — Other Ambulatory Visit: Payer: Self-pay | Admitting: Endocrinology

## 2017-08-05 ENCOUNTER — Ambulatory Visit (INDEPENDENT_AMBULATORY_CARE_PROVIDER_SITE_OTHER): Payer: Medicare Other | Admitting: Podiatry

## 2017-08-05 ENCOUNTER — Encounter: Payer: Self-pay | Admitting: Podiatry

## 2017-08-05 DIAGNOSIS — M79675 Pain in left toe(s): Secondary | ICD-10-CM | POA: Diagnosis not present

## 2017-08-05 DIAGNOSIS — E119 Type 2 diabetes mellitus without complications: Secondary | ICD-10-CM | POA: Diagnosis not present

## 2017-08-05 DIAGNOSIS — B351 Tinea unguium: Secondary | ICD-10-CM | POA: Diagnosis not present

## 2017-08-05 NOTE — Progress Notes (Signed)
This patient returns to the office preventative foot care services for treatment of his second through fifth toenails both feet. He says the nails have grown long and thick and are painful walking and wearing his shoes. Both surgical sites on the great toes have healed with no drainage or infection or pain noted  GENERAL APPEARANCE: Alert, conversant. Appropriately groomed. No acute distress.  VASCULAR: Pedal pulses palpable at  DP and PT bilateral.  Capillary refill time is immediate to all digits,  Normal temperature gradient.    NEUROLOGIC: sensation is normal to 5.07 monofilament at 5/5 sites bilateral.  Light touch is intact bilateral, Muscle strength normal.  MUSCULOSKELETAL: acceptable muscle strength, tone and stability bilateral.  Intrinsic muscluature intact bilateral.  Rectus appearance of foot and digits noted bilateral.   DERMATOLOGIC: skin color, texture, and turgor are within normal limits.  No preulcerative lesions or ulcers  are seen, no interdigital maceration noted.   NAILS  thick disfigured discolored nails both feet  DX  Onychomycosis 2-5  B/L  ROV  debridement and grinding of long thick painful nails bilaterally. Patient was told he can discontinue peroxide washes and finish his antibiotics. Return to the office in 10 weeks for further evaluation and treatment     Antionette Luster DPM   

## 2017-09-28 LAB — HM DIABETES EYE EXAM

## 2017-09-29 ENCOUNTER — Encounter: Payer: Self-pay | Admitting: Endocrinology

## 2017-10-14 ENCOUNTER — Encounter: Payer: Self-pay | Admitting: Podiatry

## 2017-10-14 ENCOUNTER — Ambulatory Visit (INDEPENDENT_AMBULATORY_CARE_PROVIDER_SITE_OTHER): Payer: Medicare Other | Admitting: Podiatry

## 2017-10-14 DIAGNOSIS — B351 Tinea unguium: Secondary | ICD-10-CM | POA: Diagnosis not present

## 2017-10-14 DIAGNOSIS — M79675 Pain in left toe(s): Secondary | ICD-10-CM

## 2017-10-14 DIAGNOSIS — E119 Type 2 diabetes mellitus without complications: Secondary | ICD-10-CM

## 2017-10-14 NOTE — Progress Notes (Signed)
This patient returns to the office preventative foot care services for treatment of his second through fifth toenails both feet. He says the nails have grown long and thick and are painful walking and wearing his shoes. Both surgical sites on the great toes have healed with no drainage or infection or pain noted  GENERAL APPEARANCE: Alert, conversant. Appropriately groomed. No acute distress.  VASCULAR: Pedal pulses palpable at  DP and PT bilateral.  Capillary refill time is immediate to all digits,  Normal temperature gradient.    NEUROLOGIC: sensation is normal to 5.07 monofilament at 5/5 sites bilateral.  Light touch is intact bilateral, Muscle strength normal.  MUSCULOSKELETAL: acceptable muscle strength, tone and stability bilateral.  Intrinsic muscluature intact bilateral.  Rectus appearance of foot and digits noted bilateral.   DERMATOLOGIC: skin color, texture, and turgor are within normal limits.  No preulcerative lesions or ulcers  are seen, no interdigital maceration noted.   NAILS  thick disfigured discolored nails both feet  DX  Onychomycosis 2-5  B/L  ROV  debridement and grinding of long thick painful nails bilaterally.  Return to the office in 10 weeks for further evaluation and treatment     Mckaela Howley DPM     

## 2017-10-25 ENCOUNTER — Other Ambulatory Visit: Payer: Self-pay | Admitting: Endocrinology

## 2017-10-28 ENCOUNTER — Other Ambulatory Visit: Payer: Self-pay | Admitting: Endocrinology

## 2017-10-28 DIAGNOSIS — E119 Type 2 diabetes mellitus without complications: Secondary | ICD-10-CM

## 2017-10-28 DIAGNOSIS — E7800 Pure hypercholesterolemia, unspecified: Secondary | ICD-10-CM

## 2017-10-29 ENCOUNTER — Other Ambulatory Visit: Payer: Self-pay

## 2017-10-29 ENCOUNTER — Ambulatory Visit: Payer: Medicare Other | Admitting: Endocrinology

## 2017-10-29 ENCOUNTER — Telehealth: Payer: Self-pay | Admitting: Endocrinology

## 2017-10-29 ENCOUNTER — Other Ambulatory Visit (INDEPENDENT_AMBULATORY_CARE_PROVIDER_SITE_OTHER): Payer: Medicare Other

## 2017-10-29 DIAGNOSIS — E119 Type 2 diabetes mellitus without complications: Secondary | ICD-10-CM | POA: Diagnosis not present

## 2017-10-29 DIAGNOSIS — E7800 Pure hypercholesterolemia, unspecified: Secondary | ICD-10-CM | POA: Diagnosis not present

## 2017-10-29 LAB — COMPREHENSIVE METABOLIC PANEL
ALBUMIN: 4.3 g/dL (ref 3.5–5.2)
ALT: 15 U/L (ref 0–53)
AST: 17 U/L (ref 0–37)
Alkaline Phosphatase: 86 U/L (ref 39–117)
BUN: 13 mg/dL (ref 6–23)
CALCIUM: 9.9 mg/dL (ref 8.4–10.5)
CHLORIDE: 104 meq/L (ref 96–112)
CO2: 29 mEq/L (ref 19–32)
CREATININE: 0.87 mg/dL (ref 0.40–1.50)
GFR: 110.59 mL/min (ref >60.00)
Glucose, Bld: 107 mg/dL — ABNORMAL HIGH (ref 70–99)
POTASSIUM: 4.2 meq/L (ref 3.5–5.1)
Sodium: 139 mEq/L (ref 135–145)
TOTAL PROTEIN: 7.6 g/dL (ref 6.0–8.3)
Total Bilirubin: 0.4 mg/dL (ref 0.2–1.2)

## 2017-10-29 LAB — LIPID PANEL
Cholesterol: 133 mg/dL (ref 0–200)
HDL: 40.6 mg/dL (ref >39.00)
LDL CALC: 76 mg/dL (ref 0–99)
NonHDL: 92.7
Total CHOL/HDL Ratio: 3
Triglycerides: 85 mg/dL (ref 0.0–149.0)
VLDL: 17 mg/dL (ref 0.0–40.0)

## 2017-10-29 LAB — HEMOGLOBIN A1C: HEMOGLOBIN A1C: 6.1 % (ref 4.6–6.5)

## 2017-10-29 LAB — MICROALBUMIN / CREATININE URINE RATIO
Creatinine,U: 99.9 mg/dL
Microalb Creat Ratio: 0.7 mg/g (ref 0.0–30.0)

## 2017-10-29 MED ORDER — SITAGLIPTIN-METFORMIN HCL 50-1000 MG PO TABS
1.0000 | ORAL_TABLET | Freq: Every day | ORAL | 0 refills | Status: DC
Start: 2017-10-29 — End: 2017-11-02

## 2017-10-29 NOTE — Telephone Encounter (Signed)
error 

## 2017-10-29 NOTE — Telephone Encounter (Signed)
Patient needs [facility]umet [facility] [address] asap

## 2017-10-29 NOTE — Telephone Encounter (Signed)
Refilled medication and Pt called and voicemail left detailing that medication that was requested for refill was sent to pharmacy.

## 2017-11-02 ENCOUNTER — Encounter: Payer: Self-pay | Admitting: Endocrinology

## 2017-11-02 ENCOUNTER — Ambulatory Visit (INDEPENDENT_AMBULATORY_CARE_PROVIDER_SITE_OTHER): Payer: Medicare Other | Admitting: Endocrinology

## 2017-11-02 VITALS — BP 132/76 | HR 92 | Ht 67.0 in | Wt 178.4 lb

## 2017-11-02 DIAGNOSIS — E119 Type 2 diabetes mellitus without complications: Secondary | ICD-10-CM

## 2017-11-02 MED ORDER — SITAGLIPTIN PHOS-METFORMIN HCL 50-1000 MG PO TABS: 1.0000 | ORAL_TABLET | Freq: Every day | ORAL | 5 refills | Status: DC

## 2017-11-02 MED ORDER — GLUCOSE BLOOD VI STRP: ORAL_STRIP | 3 refills | Status: DC

## 2017-11-02 NOTE — Progress Notes (Signed)
Patient ID: Derek [name], male   DOB: 1946-04-12, 72 y.o.   MRN: [id number]           Reason for Appointment: Diabetes follow-up   History of Present Illness   Diagnosis: Type 2 DIABETES MELITUS, date of diagnosis: 2008      Past history: Although his glucose was 600 at diagnosis he was initially treated with metformin and subsequently had relatively milder diabetes Januvia was added to his regimen in 2011  Recent history: He has not been seen in follow-up since 07/2016   He has had relatively mild diabetes which is consistently well controlled with regimen of Janumet 50/1000 once daily  He now says he takes this at 4pm Usually has upper normal A1c consistently and now 6.1% as before  He has checked blood sugar in the mornings but currently using expired test strips Lab glucose was near normal  Generally watching diet and cutting back on fried food and high calorie meals and keeping his weight about the same He thinks he is getting more fruits and vegetables Also he is trying to walk 3 times a week   Side effects from medications: None      Monitors blood glucose:  less than once a day.    Glucometer:  FreeStyle            Blood Glucose readings: Range 91-112 with average 104 Using expired test strips   PRE-MEAL Fasting Lunch Dinner Bedtime Overall  Glucose range: 102-119 69, 83     Mean/median: 106    96     HYPOGLYCEMIA: He denies any symptoms  Physical activity: exercise: walking 3-4/7 days, around 2 miles usually, weather permitting             Dietician visit: Most recent: Unknown           Wt Readings from Last 3 Encounters:  11/02/17 178 lb 6.4 oz (80.9 kg)  01/01/17 179 lb (81.2 kg)  12/18/16 179 lb (81.2 kg)   Lab Results  Component Value Date   HGBA1C 6.1 10/29/2017   HGBA1C 6.1 08/12/2016   HGBA1C 6.0 02/11/2016   Lab Results  Component Value Date   MICROALBUR <0.7 10/29/2017   LDLCALC 76 10/29/2017   CREATININE 0.87 10/29/2017    Lab on  10/29/2017  Component Date Value Ref Range Status  . Cholesterol 10/29/2017 133  0 - 200 mg/dL Final   ATP III Classification       Desirable:  < 200 mg/dL               Borderline High:  200 - 239 mg/dL          High:  > = 409 mg/dL  . Triglycerides 10/29/2017 85.0  0.0 - 149.0 mg/dL Final   Normal:  <811 mg/dLBorderline High:  150 - 199 mg/dL  . HDL 10/29/2017 40.60  >39.00 mg/dL Final  . VLDL 91/47/8295 17.0  0.0 - 40.0 mg/dL Final  . LDL Cholesterol 10/29/2017 76  0 - 99 mg/dL Final  . Total CHOL/HDL Ratio 10/29/2017 3   Final                  Men          Women1/2 Average Risk     3.4          3.3Average Risk          5.0          4.42X Average Risk  9.6          7.13X Average Risk          15.0          11.0                      . NonHDL 10/29/2017 92.70   Final   NOTE:  Non-HDL goal should be 30 mg/dL higher than patient's LDL goal (i.e. LDL goal of < 70 mg/dL, would have non-HDL goal of < 100 mg/dL)  . Microalb, Ur 10/29/2017 <0.7  0.0 - 1.9 mg/dL Final  . Creatinine,U 13/24/401004/18/2019 99.9  mg/dL Final  . Microalb Creat Ratio 10/29/2017 0.7  0.0 - 30.0 mg/g Final  . Sodium 10/29/2017 139  135 - 145 mEq/L Final  . Potassium 10/29/2017 4.2  3.5 - 5.1 mEq/L Final  . Chloride 10/29/2017 104  96 - 112 mEq/L Final  . CO2 10/29/2017 29  19 - 32 mEq/L Final  . Glucose, Bld 10/29/2017 107* 70 - 99 mg/dL Final  . BUN 27/25/366404/18/2019 13  6 - 23 mg/dL Final  . Creatinine, Ser 10/29/2017 0.87  0.40 - 1.50 mg/dL Final  . Total Bilirubin 10/29/2017 0.4  0.2 - 1.2 mg/dL Final  . Alkaline Phosphatase 10/29/2017 86  39 - 117 U/L Final  . AST 10/29/2017 17  0 - 37 U/L Final  . ALT 10/29/2017 15  0 - 53 U/L Final  . Total Protein 10/29/2017 7.6  6.0 - 8.3 g/dL Final  . Albumin 40/34/742504/18/2019 4.3  3.5 - 5.2 g/dL Final  . Calcium 95/63/875604/18/2019 9.9  8.4 - 10.5 mg/dL Final  . GFR 43/32/951804/18/2019 110.59  >60.00 mL/min Final  . Hgb A1c MFr Bld 10/29/2017 6.1  4.6 - 6.5 % Final   Glycemic Control Guidelines for  People with Diabetes:Non Diabetic:  <6%Goal of Therapy: <7%Additional Action Suggested:  >8%     Allergies as of 11/02/2017      Reactions   Penicillins Rash      Medication List        Accurate as of 11/02/17 12:18 PM. Always use your most recent med list.          aspirin 81 MG tablet Take 81 mg by mouth daily.   chlordiazePOXIDE 25 MG capsule Commonly known as:  LIBRIUM Take 25 mg by mouth daily as needed.   esomeprazole 20 MG capsule Commonly known as:  NEXIUM Take 20 mg by mouth 2 (two) times daily.   glucose blood test strip Commonly known as:  FREESTYLE LITE Use to check blood sugars 1 times per day dx code E11.9   HYDROcodone-acetaminophen 5-325 MG tablet Commonly known as:  NORCO/VICODIN Take 1 tablet by mouth every 6 (six) hours as needed for pain.   irbesartan-hydrochlorothiazide 150-12.5 MG tablet Commonly known as:  AVALIDE Take 1 tablet by mouth daily at 12 noon.   LUMIGAN 0.01 % Soln Generic drug:  bimatoprost   meclizine 25 MG tablet Commonly known as:  ANTIVERT Take 25 mg by mouth as needed.   metoprolol succinate 25 MG 24 hr tablet Commonly known as:  TOPROL-XL Take 1 tablet (25 mg total) by mouth daily.   potassium chloride 10 MEQ tablet Commonly known as:  K-DUR,KLOR-CON Take 10 mEq by mouth 2 (two) times daily.   sertraline 100 MG tablet Commonly known as:  ZOLOFT Take 100 mg by mouth daily.   simvastatin 40 MG tablet Commonly known as:  ZOCOR Take 40 mg by  mouth every evening.   sitaGLIPtin-metformin 50-1000 MG tablet Commonly known as:  JANUMET Take 1 tablet by mouth daily.   SYMBICORT 160-4.5 MCG/ACT inhaler Generic drug:  budesonide-formoterol INHALE 2 PUFFS INTO THE LUNGS EVERY DAY   tamsulosin 0.4 MG Caps capsule Commonly known as:  FLOMAX Take 0.4 mg by mouth.   triamcinolone ointment 0.1 % Commonly known as:  KENALOG Apply 1 application topically 2 (two) times daily.   VENTOLIN HFA 108 (90 Base) MCG/ACT  inhaler Generic drug:  albuterol   VIAGRA 100 MG tablet Generic drug:  sildenafil Take 100 mg by mouth as needed for erectile dysfunction.   zolpidem 10 MG tablet Commonly known as:  AMBIEN Take 10 mg by mouth at bedtime as needed for sleep.       Allergies:  Allergies  Allergen Reactions  . Penicillins Rash    Past Medical History:  Diagnosis Date  . Allergy   . Anxiety   . Arrhythmia   . Asthma   . Cataract    bilateral - just watching - no treatment yet  . Chronic headaches   . COPD (chronic obstructive pulmonary disease) (HCC)   . COPD (chronic obstructive pulmonary disease) (HCC)   . Depression   . Diabetes (HCC)   . Diverticulosis   . GERD (gastroesophageal reflux disease)   . Glaucoma   . Hyperlipidemia   . Hypertension   . IBS (irritable bowel syndrome)   . Pneumonia   . Sleep apnea    does not use CPAP    Past Surgical History:  Procedure Laterality Date  . COLONOSCOPY    . ESOPHAGOGASTRODUODENOSCOPY ENDOSCOPY    . LUMBAR SPINE SURGERY  2010  . ROTATOR CUFF REPAIR  2008  . urology surgery      Family History  Problem Relation Age of Onset  . Arthritis Mother   . Hypertension Mother   . Heart failure Father   . Diabetes Unknown   . Colon cancer Neg Hx   . Colon polyps Neg Hx   . Stomach cancer Neg Hx   . Rectal cancer Neg Hx     Social History:  reports that he has never smoked. He has never used smokeless tobacco. He reports that he drinks about 1.2 oz of alcohol per week. He reports that he does not use drugs.  Review of Systems:  HYPERTENSION:   well controlled with Avalide, also followed by PCP On Toprol 25 mg from cardiologist  HYPERLIPIDEMIA: The lipid abnormality consists of elevated LDL and low HDL treated with simvastatin 40 LDL is excellent   Lab Results  Component Value Date   CHOL 133 10/29/2017   HDL 40.60 10/29/2017   LDLCALC 76 10/29/2017   TRIG 85.0 10/29/2017   CHOLHDL 3 10/29/2017    Diabetic foot exam  10/2017    Examination:   BP 132/76 (BP Location: Left Arm, Patient Position: Sitting, Cuff Size: Normal)   Pulse 92   Ht 5\' 7"  (1.702 m)   Wt 178 lb 6.4 oz (80.9 kg)   SpO2 95%   BMI 27.94 kg/m   Body mass index is 27.94 kg/m.    Diabetic Foot Exam - Simple   Simple Foot Form Diabetic Foot exam was performed with the following findings:  Yes   Visual Inspection No deformities, no ulcerations, no other skin breakdown bilaterally:  Yes Sensation Testing Intact to touch and monofilament testing bilaterally:  Yes Pulse Check Posterior Tibialis and Dorsalis pulse intact bilaterally:  Yes Comments  ASSESSMENT/ PLAN:   Diabetes type 2  See history of present illness for  discussion of current diabetes management, blood sugar patterns and problems identified  As before his control is good with only 1 tablet of Janumet He has been trying to walk regularly Weight is stable and he is currently consistent with his diet However he is using expired test strips to check his blood sugars and not doing any readings after meals  His day-to-day management was discussed and also blood sugar targets  He can continue the 50/1000 Janumet which he takes at suppertime Description for test strips given  Hyperlipidemia:  Still well controlled with excellent LDL on simvastatin  Hypertension: Well controlled on Avalide, also monitored by PCP  He will follow-up in 6 months    Nicol Herbig 11/02/2017, 12:18 PM

## 2017-11-02 NOTE — Patient Instructions (Signed)
Check blood sugars on waking up  1-2/7  Also check blood sugars about 2 hours after a meal and do this after different meals by rotation  Recommended blood sugar levels on waking up is 90-130 and about 2 hours after meal is 130-160  Please bring your blood sugar monitor to each visit, thank you  

## 2017-12-23 ENCOUNTER — Encounter: Payer: Self-pay | Admitting: Podiatry

## 2017-12-23 ENCOUNTER — Ambulatory Visit (INDEPENDENT_AMBULATORY_CARE_PROVIDER_SITE_OTHER): Payer: Medicare Other | Admitting: Podiatry

## 2017-12-23 DIAGNOSIS — E119 Type 2 diabetes mellitus without complications: Secondary | ICD-10-CM

## 2017-12-23 DIAGNOSIS — M79675 Pain in left toe(s): Secondary | ICD-10-CM

## 2017-12-23 DIAGNOSIS — B351 Tinea unguium: Secondary | ICD-10-CM

## 2017-12-23 NOTE — Progress Notes (Signed)
This patient returns to the office preventative foot care services for treatment of his second through fifth toenails both feet. He says the nails have grown long and thick and are painful walking and wearing his shoes. Both surgical sites on the great toes have healed with no drainage or infection or pain noted  GENERAL APPEARANCE: Alert, conversant. Appropriately groomed. No acute distress.  VASCULAR: Pedal pulses palpable at  DP and PT bilateral.  Capillary refill time is immediate to all digits,  Normal temperature gradient.    NEUROLOGIC: sensation is normal to 5.07 monofilament at 5/5 sites bilateral.  Light touch is intact bilateral, Muscle strength normal.  MUSCULOSKELETAL: acceptable muscle strength, tone and stability bilateral.  Intrinsic muscluature intact bilateral.  Rectus appearance of foot and digits noted bilateral.   DERMATOLOGIC: skin color, texture, and turgor are within normal limits.  No preulcerative lesions or ulcers  are seen, no interdigital maceration noted.   NAILS  thick disfigured discolored nails both feet  DX  Onychomycosis 2-5  B/L  ROV  debridement and grinding of long thick painful nails bilaterally.  Return to the office in 10 weeks for further evaluation and treatment     Brighten Buzzelli DPM     

## 2018-03-03 ENCOUNTER — Ambulatory Visit (INDEPENDENT_AMBULATORY_CARE_PROVIDER_SITE_OTHER): Payer: Medicare Other | Admitting: Podiatry

## 2018-03-03 ENCOUNTER — Encounter: Payer: Self-pay | Admitting: Podiatry

## 2018-03-03 DIAGNOSIS — E119 Type 2 diabetes mellitus without complications: Secondary | ICD-10-CM | POA: Diagnosis not present

## 2018-03-03 DIAGNOSIS — B351 Tinea unguium: Secondary | ICD-10-CM | POA: Diagnosis not present

## 2018-03-03 DIAGNOSIS — M79675 Pain in left toe(s): Secondary | ICD-10-CM

## 2018-03-03 NOTE — Progress Notes (Signed)
This patient returns to the office preventative foot care services for treatment of his second through fifth toenails both feet. He says the nails have grown long and thick and are painful walking and wearing his shoes. Both surgical sites on the great toes have healed with no drainage or infection or pain noted  GENERAL APPEARANCE: Alert, conversant. Appropriately groomed. No acute distress.  VASCULAR: Pedal pulses palpable at  DP and PT bilateral.  Capillary refill time is immediate to all digits,  Normal temperature gradient.    NEUROLOGIC: sensation is normal to 5.07 monofilament at 5/5 sites bilateral.  Light touch is intact bilateral, Muscle strength normal.  MUSCULOSKELETAL: acceptable muscle strength, tone and stability bilateral.  Intrinsic muscluature intact bilateral.  Rectus appearance of foot and digits noted bilateral.   DERMATOLOGIC: skin color, texture, and turgor are within normal limits.  No preulcerative lesions or ulcers  are seen, no interdigital maceration noted.   NAILS  thick disfigured discolored nails both feet  DX  Onychomycosis 2-5  B/L  ROV  debridement and grinding of long thick painful nails bilaterally.  Return to the office in 10 weeks for further evaluation and treatment     Paxton Kanaan DPM     

## 2018-05-04 ENCOUNTER — Other Ambulatory Visit (INDEPENDENT_AMBULATORY_CARE_PROVIDER_SITE_OTHER): Payer: Medicare Other

## 2018-05-04 DIAGNOSIS — E119 Type 2 diabetes mellitus without complications: Secondary | ICD-10-CM

## 2018-05-04 LAB — HEMOGLOBIN A1C: Hgb A1c MFr Bld: 6.3 % (ref 4.6–6.5)

## 2018-05-04 LAB — BASIC METABOLIC PANEL
BUN: 13 mg/dL (ref 6–23)
CALCIUM: 9.8 mg/dL (ref 8.4–10.5)
CO2: 31 meq/L (ref 19–32)
CREATININE: 0.94 mg/dL (ref 0.40–1.50)
Chloride: 103 mEq/L (ref 96–112)
GFR: 101 mL/min (ref >60.00)
Glucose, Bld: 90 mg/dL (ref 70–99)
Potassium: 4.1 mEq/L (ref 3.5–5.1)
Sodium: 139 mEq/L (ref 135–145)

## 2018-05-07 ENCOUNTER — Ambulatory Visit: Payer: Medicare Other | Admitting: Endocrinology

## 2018-05-09 ENCOUNTER — Other Ambulatory Visit: Payer: Self-pay | Admitting: Endocrinology

## 2018-05-10 ENCOUNTER — Ambulatory Visit: Payer: Medicare Other | Admitting: Endocrinology

## 2018-05-12 ENCOUNTER — Ambulatory Visit (INDEPENDENT_AMBULATORY_CARE_PROVIDER_SITE_OTHER): Payer: Medicare Other | Admitting: Podiatry

## 2018-05-12 ENCOUNTER — Encounter: Payer: Self-pay | Admitting: Podiatry

## 2018-05-12 DIAGNOSIS — M79675 Pain in left toe(s): Secondary | ICD-10-CM

## 2018-05-12 DIAGNOSIS — E119 Type 2 diabetes mellitus without complications: Secondary | ICD-10-CM | POA: Diagnosis not present

## 2018-05-12 DIAGNOSIS — B351 Tinea unguium: Secondary | ICD-10-CM

## 2018-05-12 NOTE — Progress Notes (Signed)
This patient returns to the office preventative foot care services for treatment of his second through fifth toenails both feet. He says the nails have grown long and thick and are painful walking and wearing his shoes. Both surgical sites on the great toes have healed with no drainage or infection or pain noted  GENERAL APPEARANCE: Alert, conversant. Appropriately groomed. No acute distress.  VASCULAR: Pedal pulses palpable at  DP and PT bilateral.  Capillary refill time is immediate to all digits,  Normal temperature gradient.    NEUROLOGIC: sensation is normal to 5.07 monofilament at 5/5 sites bilateral.  Light touch is intact bilateral, Muscle strength normal.  MUSCULOSKELETAL: acceptable muscle strength, tone and stability bilateral.  Intrinsic muscluature intact bilateral.  Rectus appearance of foot and digits noted bilateral.   DERMATOLOGIC: skin color, texture, and turgor are within normal limits.  No preulcerative lesions or ulcers  are seen, no interdigital maceration noted.   NAILS  thick disfigured discolored nails both feet  DX  Onychomycosis 2-5  B/L  ROV  debridement and grinding of long thick painful nails bilaterally.  Return to the office in 10 weeks for further evaluation and treatment     Dameer Speiser DPM     

## 2018-07-05 ENCOUNTER — Encounter: Payer: Self-pay | Admitting: Endocrinology

## 2018-07-05 ENCOUNTER — Ambulatory Visit (INDEPENDENT_AMBULATORY_CARE_PROVIDER_SITE_OTHER): Payer: Medicare Other | Admitting: Endocrinology

## 2018-07-05 VITALS — BP 128/80 | HR 91 | Ht 67.0 in | Wt 184.0 lb

## 2018-07-05 DIAGNOSIS — E119 Type 2 diabetes mellitus without complications: Secondary | ICD-10-CM

## 2018-07-05 MED ORDER — SITAGLIPTIN PHOS-METFORMIN HCL 50-1000 MG PO TABS: 1.0000 | ORAL_TABLET | Freq: Every day | ORAL | 3 refills | Status: DC

## 2018-07-05 NOTE — Progress Notes (Signed)
Patient ID: Derek BoatmanFred A Nater, male   DOB: 1946-03-28, 72 y.o.   MRN: [id number]           Reason for Appointment: Diabetes follow-up   History of Present Illness   Diagnosis: Type 2 DIABETES MELITUS, date of diagnosis: 2008      Past history: Although his glucose was 600 at diagnosis he was initially treated with metformin and subsequently had relatively milder diabetes Januvia was added to his regimen in 2011  Recent history:  Current non-insulin hypoglycemic drugs: Janumet 50/1000 once daily  A1c is stable at 6.3 done in October  He did not bring his monitor for download He thinks his blood sugars are generally fairly good as listed below However his lab glucose was only 90 He has continued to walk regularly 3-4 times a week  Side effects from medications: None      Monitors blood glucose:  less than once a day.    Glucometer:  FreeStyle            Blood Glucose readings by recall: Range 117-130, only once up to 180 after meal           Dietician visit: Most recent: Unknown           Wt Readings from Last 3 Encounters:  07/05/18 184 lb (83.5 kg)  11/02/17 178 lb 6.4 oz (80.9 kg)  01/01/17 179 lb (81.2 kg)   Lab Results  Component Value Date   HGBA1C 6.3 05/04/2018   HGBA1C 6.1 10/29/2017   HGBA1C 6.1 08/12/2016   Lab Results  Component Value Date   MICROALBUR <0.7 10/29/2017   LDLCALC 76 10/29/2017   CREATININE 0.94 05/04/2018    No visits with results within 1 Week(s) from this visit.  Latest known visit with results is:  Lab on 05/04/2018  Component Date Value Ref Range Status  . Sodium 05/04/2018 139  135 - 145 mEq/L Final  . Potassium 05/04/2018 4.1  3.5 - 5.1 mEq/L Final  . Chloride 05/04/2018 103  96 - 112 mEq/L Final  . CO2 05/04/2018 31  19 - 32 mEq/L Final  . Glucose, Bld 05/04/2018 90  70 - 99 mg/dL Final  . BUN 40/98/119110/22/2019 13  6 - 23 mg/dL Final  . Creatinine, Ser 05/04/2018 0.94  0.40 - 1.50 mg/dL Final  . Calcium 47/82/956210/22/2019 9.8  8.4 - 10.5 mg/dL  Final  . GFR 13/08/657810/22/2019 101.00  >60.00 mL/min Final  . Hgb A1c MFr Bld 05/04/2018 6.3  4.6 - 6.5 % Final   Glycemic Control Guidelines for People with Diabetes:Non Diabetic:  <6%Goal of Therapy: <7%Additional Action Suggested:  >8%     Allergies as of 07/05/2018      Reactions   Penicillins Rash      Medication List       Accurate as of July 05, 2018  3:15 PM. Always use your most recent med list.        aspirin 81 MG tablet Take 81 mg by mouth daily.   chlordiazePOXIDE 25 MG capsule Commonly known as:  LIBRIUM Take 25 mg by mouth daily as needed.   esomeprazole 20 MG capsule Commonly known as:  NEXIUM Take 20 mg by mouth 2 (two) times daily.   glucose blood test strip Commonly known as:  FREESTYLE LITE Use to check blood sugars 1 times per day dx code E11.9   HYDROcodone-acetaminophen 5-325 MG tablet Commonly known as:  NORCO/VICODIN Take 1 tablet by mouth every 6 (six) hours  as needed for pain.   irbesartan-hydrochlorothiazide 150-12.5 MG tablet Commonly known as:  AVALIDE Take 1 tablet by mouth daily at 12 noon.   JANUMET 50-1000 MG tablet Generic drug:  sitaGLIPtin-metformin TAKE 1 TABLET BY MOUTH EVERY DAY   LUMIGAN 0.01 % Soln Generic drug:  bimatoprost   meclizine 25 MG tablet Commonly known as:  ANTIVERT Take 25 mg by mouth as needed.   metoprolol succinate 25 MG 24 hr tablet Commonly known as:  TOPROL-XL Take 1 tablet (25 mg total) by mouth daily.   potassium chloride 10 MEQ tablet Commonly known as:  K-DUR,KLOR-CON Take 10 mEq by mouth 2 (two) times daily.   sertraline 100 MG tablet Commonly known as:  ZOLOFT Take 100 mg by mouth daily.   simvastatin 20 MG tablet Commonly known as:  ZOCOR TAKE 1 TABLET BY MOUTH EVERYDAY AT BEDTIME   SYMBICORT 160-4.5 MCG/ACT inhaler Generic drug:  budesonide-formoterol INHALE 2 PUFFS INTO THE LUNGS EVERY DAY   tamsulosin 0.4 MG Caps capsule Commonly known as:  FLOMAX Take 0.4 mg by mouth.     triamcinolone ointment 0.1 % Commonly known as:  KENALOG Apply 1 application topically 2 (two) times daily.   VENTOLIN HFA 108 (90 Base) MCG/ACT inhaler Generic drug:  albuterol   VIAGRA 100 MG tablet Generic drug:  sildenafil Take 100 mg by mouth as needed for erectile dysfunction.   zolpidem 10 MG tablet Commonly known as:  AMBIEN Take 10 mg by mouth at bedtime as needed for sleep.       Allergies:  Allergies  Allergen Reactions  . Penicillins Rash    Past Medical History:  Diagnosis Date  . Allergy   . Anxiety   . Arrhythmia   . Asthma   . Cataract    bilateral - just watching - no treatment yet  . Chronic headaches   . COPD (chronic obstructive pulmonary disease) (HCC)   . COPD (chronic obstructive pulmonary disease) (HCC)   . Depression   . Diabetes (HCC)   . Diverticulosis   . GERD (gastroesophageal reflux disease)   . Glaucoma   . Hyperlipidemia   . Hypertension   . IBS (irritable bowel syndrome)   . Pneumonia   . Sleep apnea    does not use CPAP    Past Surgical History:  Procedure Laterality Date  . COLONOSCOPY    . ESOPHAGOGASTRODUODENOSCOPY ENDOSCOPY    . LUMBAR SPINE SURGERY  2010  . ROTATOR CUFF REPAIR  2008  . urology surgery      Family History  Problem Relation Age of Onset  . Arthritis Mother   . Hypertension Mother   . Heart failure Father   . Diabetes Unknown   . Colon cancer Neg Hx   . Colon polyps Neg Hx   . Stomach cancer Neg Hx   . Rectal cancer Neg Hx     Social History:  reports that he has never smoked. He has never used smokeless tobacco. He reports current alcohol use of about 2.0 standard drinks of alcohol per week. He reports that he does not use drugs.  Review of Systems:  HYPERTENSION:   well controlled with Avalide, also followed by PCP recently On Toprol 25 mg from cardiologist  HYPERLIPIDEMIA: The lipid abnormality consists of elevated LDL and low HDL treated with simvastatin 40 LDL is recently checked  by PCP and is below 175   Lab Results  Component Value Date   CHOL 133 10/29/2017   HDL 40.60  10/29/2017   LDLCALC 76 10/29/2017   TRIG 85.0 10/29/2017   CHOLHDL 3 10/29/2017    Diabetic foot exam 10/2017    Examination:   BP 128/80   Pulse 91   Ht 5\' 7"  (1.702 m)   Wt 184 lb (83.5 kg)   SpO2 98%   BMI 28.82 kg/m   Body mass index is 28.82 kg/m.   No ankle edema present   ASSESSMENT/ PLAN:   Diabetes type 2  See history of present illness for  discussion of current diabetes management, blood sugar patterns and problems identified  A1c is very stable at 6.3  He has been treated with Janumet 50/1000, 1 tablet daily long-term  Although unable to see his home blood sugars as he does not have his monitor his blood sugars are reportedly fairly good Also fasting lab glucose is excellent He is trying to do some exercise although recently has gained weight  We will continue same regimen but check more readings after meals   Hyperlipidemia: Consistently controlled with excellent LDL on simvastatin  Hypertension: Well controlled on Avalide with no hypokalemia and also on potassium supplement  He will follow-up in 12 months unless he has any significant change in his blood sugars    Reather LittlerAjay Sorah Falkenstein 07/05/2018, 3:15 PM

## 2018-07-27 LAB — HM DIABETES EYE EXAM

## 2018-08-11 ENCOUNTER — Encounter: Payer: Self-pay | Admitting: Podiatry

## 2018-08-11 ENCOUNTER — Ambulatory Visit (INDEPENDENT_AMBULATORY_CARE_PROVIDER_SITE_OTHER): Payer: Medicare Other | Admitting: Podiatry

## 2018-08-11 DIAGNOSIS — B351 Tinea unguium: Secondary | ICD-10-CM

## 2018-08-11 DIAGNOSIS — M79675 Pain in left toe(s): Secondary | ICD-10-CM | POA: Diagnosis not present

## 2018-08-11 DIAGNOSIS — E119 Type 2 diabetes mellitus without complications: Secondary | ICD-10-CM | POA: Diagnosis not present

## 2018-08-11 NOTE — Progress Notes (Signed)
This patient returns to the office preventative foot care services for treatment of his second through fifth toenails both feet. He says the nails have grown long and thick and are painful walking and wearing his shoes. Both surgical sites on the great toes have healed with no drainage or infection or pain noted  GENERAL APPEARANCE: Alert, conversant. Appropriately groomed. No acute distress.  VASCULAR: Pedal pulses palpable at  DP and PT bilateral.  Capillary refill time is immediate to all digits,  Normal temperature gradient.    NEUROLOGIC: sensation is normal to 5.07 monofilament at 5/5 sites bilateral.  Light touch is intact bilateral, Muscle strength normal.  MUSCULOSKELETAL: acceptable muscle strength, tone and stability bilateral.  Intrinsic muscluature intact bilateral.  Rectus appearance of foot and digits noted bilateral.   DERMATOLOGIC: skin color, texture, and turgor are within normal limits.  No preulcerative lesions or ulcers  are seen, no interdigital maceration noted.   NAILS  thick disfigured discolored nails both feet  DX  Onychomycosis 2-5  B/L  ROV  debridement and grinding of long thick painful nails bilaterally.  Return to the office in 10 weeks for further evaluation and treatment     Kahil Agner DPM     

## 2018-08-18 ENCOUNTER — Ambulatory Visit: Payer: 59 | Admitting: Cardiovascular Disease

## 2018-08-19 ENCOUNTER — Encounter: Payer: Self-pay | Admitting: Physician Assistant

## 2018-08-19 ENCOUNTER — Ambulatory Visit (INDEPENDENT_AMBULATORY_CARE_PROVIDER_SITE_OTHER): Payer: Medicare Other | Admitting: Physician Assistant

## 2018-08-19 ENCOUNTER — Ambulatory Visit: Payer: Medicare Other | Admitting: Physician Assistant

## 2018-08-19 VITALS — BP 120/72 | HR 85 | Ht 70.5 in | Wt 178.0 lb

## 2018-08-19 DIAGNOSIS — E785 Hyperlipidemia, unspecified: Secondary | ICD-10-CM

## 2018-08-19 DIAGNOSIS — E119 Type 2 diabetes mellitus without complications: Secondary | ICD-10-CM | POA: Diagnosis not present

## 2018-08-19 DIAGNOSIS — I10 Essential (primary) hypertension: Secondary | ICD-10-CM | POA: Diagnosis not present

## 2018-08-19 NOTE — Progress Notes (Signed)
Cardiology Office Note    Date:  08/19/2018   ID:  Derek BoatmanFred A [name], DOB 07/14/1946, MRN [id number]  PCP:  Corine ShelterKilpatrick, George, MD  Cardiologist:  Dr. Royann Shiversroitoru  Chief Complaint  Patient presents with  . Follow-up    seen for Dr. Royann Shiversroitoru    History of Present Illness:  Derek [name] is a 73 y.o. male with PMH of DM II, HLD, and HTN.  Patient was previously followed by Dr. Algie CofferKadakia.  Echocardiogram obtained on 11/22/2012 showed EF 60%, mild diastolic dysfunction, mild MR and TR.  He had a treadmill test in 2015 which showed equivocal EKG for ischemia.  Patient presents today for cardiology office visit.  He denies any recent chest discomfort or shortness of breath.  He walks several miles 3 times a week without any exertional issues.  His diabetes is very well controlled.  His last lipid panel in April 2019 showed very well-controlled cholesterol.    Past Medical History:  Diagnosis Date  . Allergy   . Anxiety   . Arrhythmia   . Asthma   . Cataract    bilateral - just watching - no treatment yet  . Chronic headaches   . COPD (chronic obstructive pulmonary disease) (HCC)   . COPD (chronic obstructive pulmonary disease) (HCC)   . Depression   . Diabetes (HCC)   . Diverticulosis   . GERD (gastroesophageal reflux disease)   . Glaucoma   . Hyperlipidemia   . Hypertension   . IBS (irritable bowel syndrome)   . Pneumonia   . Sleep apnea    does not use CPAP    Past Surgical History:  Procedure Laterality Date  . COLONOSCOPY    . ESOPHAGOGASTRODUODENOSCOPY ENDOSCOPY    . LUMBAR SPINE SURGERY  2010  . ROTATOR CUFF REPAIR  2008  . urology surgery      Current Medications: Outpatient Medications Prior to Visit  Medication Sig Dispense Refill  . aspirin 81 MG tablet Take 81 mg by mouth daily.    . chlordiazePOXIDE (LIBRIUM) 25 MG capsule Take 25 mg by mouth daily as needed.   1  . esomeprazole (NEXIUM) 20 MG capsule Take 20 mg by mouth 2 (two) times daily.  4  . glucose blood  (FREESTYLE LITE) test strip Use to check blood sugars 1 times per day dx code E11.9 50 each 3  . HYDROcodone-acetaminophen (NORCO/VICODIN) 5-325 MG per tablet Take 1 tablet by mouth every 6 (six) hours as needed for pain.    Marland Kitchen. irbesartan-hydrochlorothiazide (AVALIDE) 150-12.5 MG per tablet Take 1 tablet by mouth daily at 12 noon.     Marland Kitchen. LUMIGAN 0.01 % SOLN   11  . meclizine (ANTIVERT) 25 MG tablet Take 25 mg by mouth as needed.   4  . metoprolol succinate (TOPROL-XL) 25 MG 24 hr tablet Take 1 tablet (25 mg total) by mouth daily. 90 tablet 1  . potassium chloride (K-DUR,KLOR-CON) 10 MEQ tablet Take 10 mEq by mouth 2 (two) times daily.    . sertraline (ZOLOFT) 100 MG tablet Take 100 mg by mouth daily.    . sildenafil (VIAGRA) 100 MG tablet Take 100 mg by mouth as needed for erectile dysfunction.    . simvastatin (ZOCOR) 20 MG tablet TAKE 1 TABLET BY MOUTH EVERYDAY AT BEDTIME  3  . sitaGLIPtin-metformin (JANUMET) 50-1000 MG tablet Take 1 tablet by mouth daily. 30 tablet 3  . SYMBICORT 160-4.5 MCG/ACT inhaler INHALE 2 PUFFS INTO THE LUNGS EVERY DAY  4  .  tamsulosin (FLOMAX) 0.4 MG CAPS capsule Take 0.4 mg by mouth.    . triamcinolone ointment (KENALOG) 0.1 % Apply 1 application topically 2 (two) times daily. 30 g 0  . VENTOLIN HFA 108 (90 BASE) MCG/ACT inhaler   4  . zolpidem (AMBIEN) 10 MG tablet Take 10 mg by mouth at bedtime as needed for sleep.     Facility-Administered Medications Prior to Visit  Medication Dose Route Frequency Provider Last Rate Last Dose  . 0.9 %  sodium chloride infusion  500 mL Intravenous Continuous Pyrtle, Carie Caddy, MD         Allergies:   Penicillins   Social History   Socioeconomic History  . Marital status: Married    Spouse name: Not on file  . Number of children: 2  . Years of education: Not on file  . Highest education level: Not on file  Occupational History  . Occupation: Retired  Engineer, production  . Financial resource strain: Not on file  . Food  insecurity:    Worry: Not on file    Inability: Not on file  . Transportation needs:    Medical: Not on file    Non-medical: Not on file  Tobacco Use  . Smoking status: Never Smoker  . Smokeless tobacco: Never Used  Substance and Sexual Activity  . Alcohol use: Yes    Alcohol/week: 2.0 standard drinks    Types: 2 Shots of liquor per week  . Drug use: No  . Sexual activity: Not on file  Lifestyle  . Physical activity:    Days per week: Not on file    Minutes per session: Not on file  . Stress: Not on file  Relationships  . Social connections:    Talks on phone: Not on file    Gets together: Not on file    Attends religious service: Not on file    Active member of club or organization: Not on file    Attends meetings of clubs or organizations: Not on file    Relationship status: Not on file  Other Topics Concern  . Not on file  Social History Narrative  . Not on file     Family History:  The patient's family history includes Arthritis in his mother; Diabetes in an other family member; Heart failure in his father; Hypertension in his mother.   ROS:   Please see the history of present illness.    ROS All other systems reviewed and are negative.   PHYSICAL EXAM:   VS:  BP 120/72 (BP Location: Left Arm, Patient Position: Sitting, Cuff Size: Normal)   Pulse 85   Ht 5' 10.5" (1.791 m)   Wt 178 lb (80.7 kg)   BMI 25.18 kg/m    GEN: Well nourished, well developed, in no acute distress  HEENT: normal  Neck: no JVD, carotid bruits, or masses Cardiac: RRR; no murmurs, rubs, or gallops,no edema  Respiratory:  clear to auscultation bilaterally, normal work of breathing GI: soft, nontender, nondistended, + BS MS: no deformity or atrophy  Skin: warm and dry, no rash Neuro:  Alert and Oriented x 3, Strength and sensation are intact Psych: euthymic mood, full affect  Wt Readings from Last 3 Encounters:  08/19/18 178 lb (80.7 kg)  07/05/18 184 lb (83.5 kg)  11/02/17 178 lb  6.4 oz (80.9 kg)      Studies/Labs Reviewed:   EKG:  EKG is ordered today.  The ekg ordered today demonstrates normal sinus rhythm without  significant ST-T wave changes.  Recent Labs: 10/29/2017: ALT 15 05/04/2018: BUN 13; Creatinine, Ser 0.94; Potassium 4.1; Sodium 139   Lipid Panel    Component Value Date/Time   CHOL 133 10/29/2017 0820   TRIG 85.0 10/29/2017 0820   HDL 40.60 10/29/2017 0820   CHOLHDL 3 10/29/2017 0820   VLDL 17.0 10/29/2017 0820   LDLCALC 76 10/29/2017 0820    Additional studies/ records that were reviewed today include:   Echo 11/22/2012    ASSESSMENT:    1. Essential hypertension   2. Hyperlipidemia LDL goal <70   3. Controlled type 2 diabetes mellitus without complication, without long-term current use of insulin (HCC)      PLAN:  In order of problems listed above:  1. Hypertension: Blood pressure well controlled on current therapy  2. Hyperlipidemia: Continue Zocor.  Last lipid panel again April 2019 showed very well-controlled cholesterol.  Due for repeat lipid panel in April of this year by his primary care provider.  LDL goal less than 70 given metabolic syndrome.  3. DM2: Last hemoglobin A1c was 6.3.  Managed by primary care provider    Medication Adjustments/Labs and Tests Ordered: Current medicines are reviewed at length with the patient today.  Concerns regarding medicines are outlined above.  Medication changes, Labs and Tests ordered today are listed in the Patient Instructions below. Patient Instructions  Medication Instructions:   Your physician recommends that you continue on your current medications as directed. Please refer to the Current Medication list given to you today.  If you need a refill on your cardiac medications before your next appointment, please call your pharmacy.   Lab work:  NONE  If you have labs (blood work) drawn today and your tests are completely normal, you will receive your results only  by: Marland Kitchen. MyChart Message (if you have MyChart) OR . A paper copy in the mail If you have any lab test that is abnormal or we need to change your treatment, we will call you to review the results.  Testing/Procedures:  NONE  Follow-Up:  Your physician recommends that you schedule a follow-up appointment in 12 MONTHS (February 2021) with Dr. Royann Shiversroitoru call our office in December 2020 for the February 2021 appointment.         Ramond DialSigned, An Schnabel, [place]  08/19/2018 4:12 PM    [facility] [address], [place], [place]  [address] Phone: [phone]; Fax: [phone]

## 2018-08-19 NOTE — Progress Notes (Deleted)
Cardiology Office Note    Date:  08/19/2018   ID:  Derek [name], DOB 05/11/1946, MRN [id number]  PCP:  Corine ShelterKilpatrick, George, MD  Cardiologist: Dr. Royann Shiversroitoru  No chief complaint on file.   History of Present Illness:  Derek BoatmanFred A [name] is a 73 y.o. male with PMH of DM II, HLD, and HTN.  Patient was previously followed by Dr. Algie CofferKadakia.  Echocardiogram obtained on 11/22/2012 showed EF 60%, mild diastolic dysfunction, mild MR and TR.  He had a treadmill test in 2015 which showed equivocal EKG for ischemia.    Past Medical History:  Diagnosis Date  . Allergy   . Anxiety   . Arrhythmia   . Asthma   . Cataract    bilateral - just watching - no treatment yet  . Chronic headaches   . COPD (chronic obstructive pulmonary disease) (HCC)   . COPD (chronic obstructive pulmonary disease) (HCC)   . Depression   . Diabetes (HCC)   . Diverticulosis   . GERD (gastroesophageal reflux disease)   . Glaucoma   . Hyperlipidemia   . Hypertension   . IBS (irritable bowel syndrome)   . Pneumonia   . Sleep apnea    does not use CPAP    Past Surgical History:  Procedure Laterality Date  . COLONOSCOPY    . ESOPHAGOGASTRODUODENOSCOPY ENDOSCOPY    . LUMBAR SPINE SURGERY  2010  . ROTATOR CUFF REPAIR  2008  . urology surgery      Current Medications: Outpatient Medications Prior to Visit  Medication Sig Dispense Refill  . aspirin 81 MG tablet Take 81 mg by mouth daily.    . chlordiazePOXIDE (LIBRIUM) 25 MG capsule Take 25 mg by mouth daily as needed.   1  . esomeprazole (NEXIUM) 20 MG capsule Take 20 mg by mouth 2 (two) times daily.  4  . glucose blood (FREESTYLE LITE) test strip Use to check blood sugars 1 times per day dx code E11.9 50 each 3  . HYDROcodone-acetaminophen (NORCO/VICODIN) 5-325 MG per tablet Take 1 tablet by mouth every 6 (six) hours as needed for pain.    Marland Kitchen. irbesartan-hydrochlorothiazide (AVALIDE) 150-12.5 MG per tablet Take 1 tablet by mouth daily at 12 noon.     Marland Kitchen. LUMIGAN 0.01 %  SOLN   11  . meclizine (ANTIVERT) 25 MG tablet Take 25 mg by mouth as needed.   4  . metoprolol succinate (TOPROL-XL) 25 MG 24 hr tablet Take 1 tablet (25 mg total) by mouth daily. 90 tablet 1  . potassium chloride (K-DUR,KLOR-CON) 10 MEQ tablet Take 10 mEq by mouth 2 (two) times daily.    . sertraline (ZOLOFT) 100 MG tablet Take 100 mg by mouth daily.    . sildenafil (VIAGRA) 100 MG tablet Take 100 mg by mouth as needed for erectile dysfunction.    . simvastatin (ZOCOR) 20 MG tablet TAKE 1 TABLET BY MOUTH EVERYDAY AT BEDTIME  3  . sitaGLIPtin-metformin (JANUMET) 50-1000 MG tablet Take 1 tablet by mouth daily. 30 tablet 3  . SYMBICORT 160-4.5 MCG/ACT inhaler INHALE 2 PUFFS INTO THE LUNGS EVERY DAY  4  . tamsulosin (FLOMAX) 0.4 MG CAPS capsule Take 0.4 mg by mouth.    . triamcinolone ointment (KENALOG) 0.1 % Apply 1 application topically 2 (two) times daily. 30 g 0  . VENTOLIN HFA 108 (90 BASE) MCG/ACT inhaler   4  . zolpidem (AMBIEN) 10 MG tablet Take 10 mg by mouth at bedtime as needed for sleep.  Facility-Administered Medications Prior to Visit  Medication Dose Route Frequency Provider Last Rate Last Dose  . 0.9 %  sodium chloride infusion  500 mL Intravenous Continuous Pyrtle, Carie Caddy, MD         Allergies:   Penicillins   Social History   Socioeconomic History  . Marital status: Married    Spouse name: Not on file  . Number of children: 2  . Years of education: Not on file  . Highest education level: Not on file  Occupational History  . Occupation: Retired  Engineer, production  . Financial resource strain: Not on file  . Food insecurity:    Worry: Not on file    Inability: Not on file  . Transportation needs:    Medical: Not on file    Non-medical: Not on file  Tobacco Use  . Smoking status: Never Smoker  . Smokeless tobacco: Never Used  Substance and Sexual Activity  . Alcohol use: Yes    Alcohol/week: 2.0 standard drinks    Types: 2 Shots of liquor per week  . Drug use:  No  . Sexual activity: Not on file  Lifestyle  . Physical activity:    Days per week: Not on file    Minutes per session: Not on file  . Stress: Not on file  Relationships  . Social connections:    Talks on phone: Not on file    Gets together: Not on file    Attends religious service: Not on file    Active member of club or organization: Not on file    Attends meetings of clubs or organizations: Not on file    Relationship status: Not on file  Other Topics Concern  . Not on file  Social History Narrative  . Not on file     Family History:  The patient's ***family history includes Arthritis in his mother; Diabetes in his unknown relative; Heart failure in his father; Hypertension in his mother.   ROS:   Please see the history of present illness.    ROS All other systems reviewed and are negative.   PHYSICAL EXAM:   VS:  There were no vitals taken for this visit.   GEN: Well nourished, well developed, in no acute distress  HEENT: normal  Neck: no JVD, carotid bruits, or masses Cardiac: ***RRR; no murmurs, rubs, or gallops,no edema  Respiratory:  clear to auscultation bilaterally, normal work of breathing GI: soft, nontender, nondistended, + BS MS: no deformity or atrophy  Skin: warm and dry, no rash Neuro:  Alert and Oriented x 3, Strength and sensation are intact Psych: euthymic mood, full affect  Wt Readings from Last 3 Encounters:  07/05/18 184 lb (83.5 kg)  11/02/17 178 lb 6.4 oz (80.9 kg)  01/01/17 179 lb (81.2 kg)      Studies/Labs Reviewed:   EKG:  EKG is*** ordered today.  The ekg ordered today demonstrates ***  Recent Labs: 10/29/2017: ALT 15 05/04/2018: BUN 13; Creatinine, Ser 0.94; Potassium 4.1; Sodium 139   Lipid Panel    Component Value Date/Time   CHOL 133 10/29/2017 0820   TRIG 85.0 10/29/2017 0820   HDL 40.60 10/29/2017 0820   CHOLHDL 3 10/29/2017 0820   VLDL 17.0 10/29/2017 0820   LDLCALC 76 10/29/2017 0820    Additional studies/  records that were reviewed today include:  ***    ASSESSMENT:    No diagnosis found.   PLAN:  In order of problems listed above:  1. ***  Medication Adjustments/Labs and Tests Ordered: Current medicines are reviewed at length with the patient today.  Concerns regarding medicines are outlined above.  Medication changes, Labs and Tests ordered today are listed in the Patient Instructions below. There are no Patient Instructions on file for this visit.   Ramond DialSigned, Analeise Mccleery, [place]  08/19/2018 2:35 PM    [facility] [address], [place], [place]  [address] Phone: [phone]; Fax: [phone]

## 2018-08-19 NOTE — Patient Instructions (Signed)
Medication Instructions:   Your physician recommends that you continue on your current medications as directed. Please refer to the Current Medication list given to you today.  If you need a refill on your cardiac medications before your next appointment, please call your pharmacy.   Lab work:  NONE  If you have labs (blood work) drawn today and your tests are completely normal, you will receive your results only by: Marland Kitchen MyChart Message (if you have MyChart) OR . A paper copy in the mail If you have any lab test that is abnormal or we need to change your treatment, we will call you to review the results.  Testing/Procedures:  NONE  Follow-Up:  Your physician recommends that you schedule a follow-up appointment in 12 MONTHS (February 2021) with Dr. Royann Shivers call our office in December 2020 for the February 2021 appointment.

## 2018-09-17 ENCOUNTER — Other Ambulatory Visit: Payer: Self-pay | Admitting: Endocrinology

## 2018-10-20 ENCOUNTER — Ambulatory Visit: Payer: Medicare Other | Admitting: Podiatry

## 2018-12-16 ENCOUNTER — Other Ambulatory Visit: Payer: Self-pay | Admitting: Endocrinology

## 2019-01-04 ENCOUNTER — Ambulatory Visit (INDEPENDENT_AMBULATORY_CARE_PROVIDER_SITE_OTHER): Payer: Medicare Other | Admitting: Podiatry

## 2019-01-04 ENCOUNTER — Encounter: Payer: Self-pay | Admitting: Podiatry

## 2019-01-04 ENCOUNTER — Other Ambulatory Visit: Payer: Self-pay

## 2019-01-04 DIAGNOSIS — M79674 Pain in right toe(s): Secondary | ICD-10-CM | POA: Diagnosis not present

## 2019-01-04 DIAGNOSIS — M79675 Pain in left toe(s): Secondary | ICD-10-CM | POA: Insufficient documentation

## 2019-01-04 DIAGNOSIS — E119 Type 2 diabetes mellitus without complications: Secondary | ICD-10-CM | POA: Diagnosis not present

## 2019-01-04 DIAGNOSIS — B351 Tinea unguium: Secondary | ICD-10-CM | POA: Diagnosis not present

## 2019-01-04 NOTE — Progress Notes (Signed)
This patient returns to the office preventative foot care services for treatment of his second through fifth toenails both feet. He says the nails have grown long and thick and are painful walking and wearing his shoes. Both surgical sites on the great toes have healed with no drainage or infection or pain noted  GENERAL APPEARANCE: Alert, conversant. Appropriately groomed. No acute distress.  VASCULAR: Pedal pulses palpable at  [facility] and PT bilateral.  Capillary refill time is immediate to all digits,  Normal temperature gradient.    NEUROLOGIC: sensation is normal to 5.07 monofilament at 5/5 sites bilateral.  Light touch is intact bilateral, Muscle strength normal.  MUSCULOSKELETAL: acceptable muscle strength, tone and stability bilateral.  Intrinsic muscluature intact bilateral.  Rectus appearance of foot and digits noted bilateral.   DERMATOLOGIC: skin color, texture, and turgor are within normal limits.  No preulcerative lesions or ulcers  are seen, no interdigital maceration noted.   NAILS  thick disfigured discolored nails both feet  DX  Onychomycosis 2-5  B/L  ROV  debridement and grinding of long thick painful nails bilaterally.  Return to the office in 10 weeks for further evaluation and treatment     Gardiner Barefoot DPM

## 2019-04-12 ENCOUNTER — Other Ambulatory Visit: Payer: Self-pay

## 2019-04-12 ENCOUNTER — Encounter: Payer: Self-pay | Admitting: Podiatry

## 2019-04-12 ENCOUNTER — Ambulatory Visit (INDEPENDENT_AMBULATORY_CARE_PROVIDER_SITE_OTHER): Payer: Medicare Other | Admitting: Podiatry

## 2019-04-12 DIAGNOSIS — M79674 Pain in right toe(s): Secondary | ICD-10-CM

## 2019-04-12 DIAGNOSIS — E119 Type 2 diabetes mellitus without complications: Secondary | ICD-10-CM | POA: Diagnosis not present

## 2019-04-12 DIAGNOSIS — B351 Tinea unguium: Secondary | ICD-10-CM

## 2019-04-12 DIAGNOSIS — M79675 Pain in left toe(s): Secondary | ICD-10-CM

## 2019-04-12 NOTE — Progress Notes (Signed)
This patient returns to the office preventative foot care services for treatment of his second through fifth toenails both feet. He says the nails have grown long and thick and are painful walking and wearing his shoes. Both surgical sites on the great toes have healed with no drainage or infection or pain noted  GENERAL APPEARANCE: Alert, conversant. Appropriately groomed. No acute distress.  VASCULAR: Pedal pulses palpable at  [facility] and PT bilateral.  Capillary refill time is immediate to all digits,  Normal temperature gradient.    NEUROLOGIC: sensation is normal to 5.07 monofilament at 5/5 sites bilateral.  Light touch is intact bilateral, Muscle strength normal.  MUSCULOSKELETAL: acceptable muscle strength, tone and stability bilateral.  Intrinsic muscluature intact bilateral.  Rectus appearance of foot and digits noted bilateral.   DERMATOLOGIC: skin color, texture, and turgor are within normal limits.  No preulcerative lesions or ulcers  are seen, no interdigital maceration noted.   NAILS  thick disfigured discolored nails 2-5  B/L.  Hallux nails surgically removed.  DX  Onychomycosis 2-5  B/L  ROV  debridement and grinding of long thick painful nails bilaterally.  Return to the office in 12weeks for further evaluation and treatment     Gardiner Barefoot DPM

## 2019-04-27 LAB — HM DIABETES EYE EXAM

## 2019-07-02 ENCOUNTER — Other Ambulatory Visit: Payer: Self-pay | Admitting: Endocrinology

## 2019-07-12 ENCOUNTER — Encounter: Payer: Self-pay | Admitting: Podiatry

## 2019-07-12 ENCOUNTER — Other Ambulatory Visit: Payer: Self-pay

## 2019-07-12 ENCOUNTER — Ambulatory Visit (INDEPENDENT_AMBULATORY_CARE_PROVIDER_SITE_OTHER): Payer: Medicare Other | Admitting: Podiatry

## 2019-07-12 DIAGNOSIS — B351 Tinea unguium: Secondary | ICD-10-CM

## 2019-07-12 DIAGNOSIS — M79675 Pain in left toe(s): Secondary | ICD-10-CM | POA: Diagnosis not present

## 2019-07-12 DIAGNOSIS — M79674 Pain in right toe(s): Secondary | ICD-10-CM

## 2019-07-12 DIAGNOSIS — E119 Type 2 diabetes mellitus without complications: Secondary | ICD-10-CM

## 2019-07-12 NOTE — Progress Notes (Signed)
This patient returns to the office preventative foot care services for treatment of his second through fifth toenails both feet. He says the nails have grown long and thick and are painful walking and wearing his shoes. Both surgical sites on the great toes have healed with no drainage or infection or pain noted  GENERAL APPEARANCE: Alert, conversant. Appropriately groomed. No acute distress.  VASCULAR: Pedal pulses palpable at  [facility] and PT bilateral.  Capillary refill time is immediate to all digits,  Normal temperature gradient.    NEUROLOGIC: sensation is normal to 5.07 monofilament at 5/5 sites bilateral.  Light touch is intact bilateral, Muscle strength normal.  MUSCULOSKELETAL: acceptable muscle strength, tone and stability bilateral.  Intrinsic muscluature intact bilateral.  Rectus appearance of foot and digits noted bilateral.   DERMATOLOGIC: skin color, texture, and turgor are within normal limits.  No preulcerative lesions or ulcers  are seen, no interdigital maceration noted.   NAILS  thick disfigured discolored nails 2-5  B/L.  Hallux nails surgically removed.  DX  Onychomycosis 2-5  B/L  ROV  debridement and grinding of long thick painful nails bilaterally.  Return to the office in 12 weeks for preventative foot care services.     Gardiner Barefoot DPM

## 2019-07-24 ENCOUNTER — Other Ambulatory Visit: Payer: Self-pay | Admitting: Endocrinology

## 2019-07-25 ENCOUNTER — Other Ambulatory Visit: Payer: Self-pay | Admitting: Endocrinology

## 2019-07-25 ENCOUNTER — Telehealth: Payer: Self-pay

## 2019-07-25 NOTE — Telephone Encounter (Signed)
-----   Message from Reather Littler, MD sent at 07/25/2019  8:20 AM EST ----- Regarding: Needs appointment Patient overdue for appointment.  Please call to schedule with labs before appointment

## 2019-07-25 NOTE — Telephone Encounter (Signed)
LMTCB to schedule appt

## 2019-07-25 NOTE — Telephone Encounter (Signed)
Refill or deny? 

## 2019-07-26 ENCOUNTER — Other Ambulatory Visit: Payer: Self-pay | Admitting: Endocrinology

## 2019-07-26 ENCOUNTER — Telehealth: Payer: Self-pay

## 2019-07-26 NOTE — Telephone Encounter (Signed)
Rx sent 

## 2019-07-26 NOTE — Telephone Encounter (Signed)
Patient has appointment scheduled for 08/23/2019 and labs on 08/18/2019

## 2019-07-26 NOTE — Telephone Encounter (Signed)
MEDICATION: JANUMET 50-1000 MG tablet  PHARMACY:  [id number] - [place], [place] - [address]  IS THIS A 90 DAY SUPPLY :   IS PATIENT OUT OF MEDICATION:   IF NOT; HOW MUCH IS LEFT:   LAST APPOINTMENT DATE: @1 /06/2020  NEXT APPOINTMENT DATE:@2 /10/2019  DO WE HAVE YOUR PERMISSION TO LEAVE A DETAILED MESSAGE:  OTHER COMMENTS:    **Let patient know to contact pharmacy at the end of the day to make sure medication is ready. **  ** Please notify patient to allow 48-72 hours to process**  **Encourage patient to contact the pharmacy for refills or they can request refills through [facility]**

## 2019-08-06 IMAGING — CR DG UGI W/ HIGH DENSITY W/KUB
12 of 15 series · 12 of 24 positions shown · non-contrast
Comparison: None.

CLINICAL DATA: Globus sensation of pills getting stuck in his upper
throat. Occasionally chokes on solids and liquids.

EXAM:
UPPER GI SERIES WITH KUB
TECHNIQUE: After obtaining a scout radiograph a routine upper GI series was
performed using thin and high density barium.
FLUOROSCOPY TIME:  Fluoroscopy Time:  2 minutes, 6 seconds.
Radiation Exposure Index (if provided by the fluoroscopic device):
25.5 mGy
Number of Acquired Spot Images: 7

[cp_standard (1 of 9) · tomo slice 14/93.0]
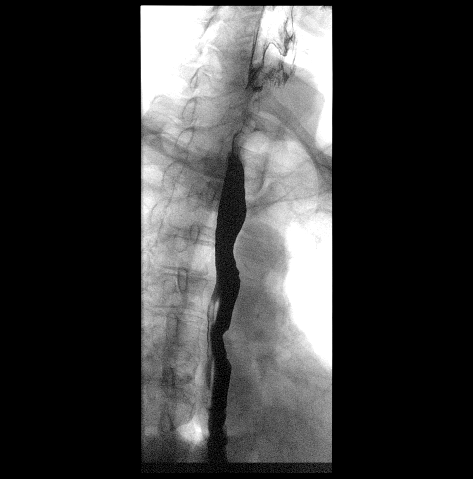

[cp_standard (2 of 9) · tomo slice 36/100.0]
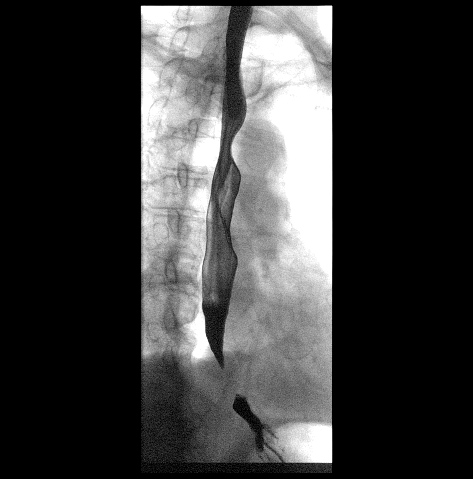

[cp_standard (3 of 9) · tomo slice 47/92.0]
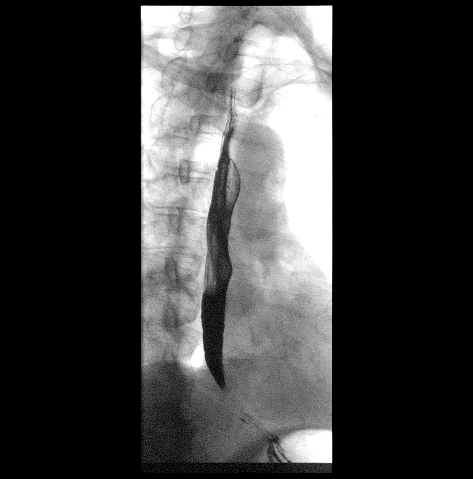

[fluoro_barium 2fps_bw (1 of 3)]
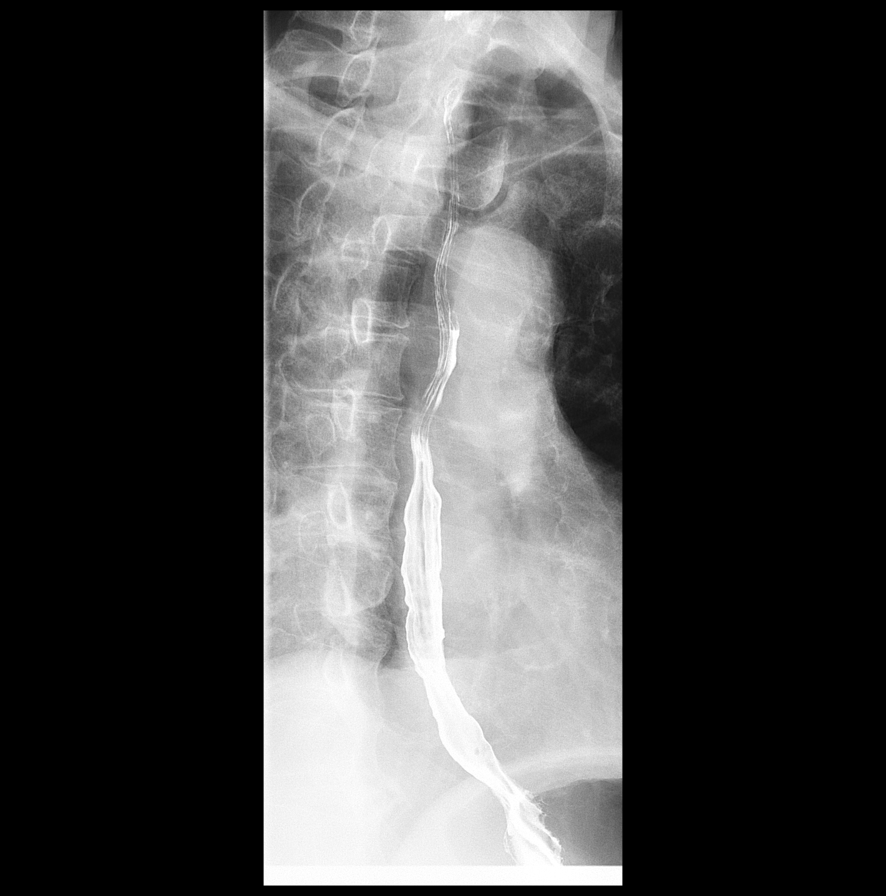

[cp_standard (4 of 9) · tomo slice 89/104.0]
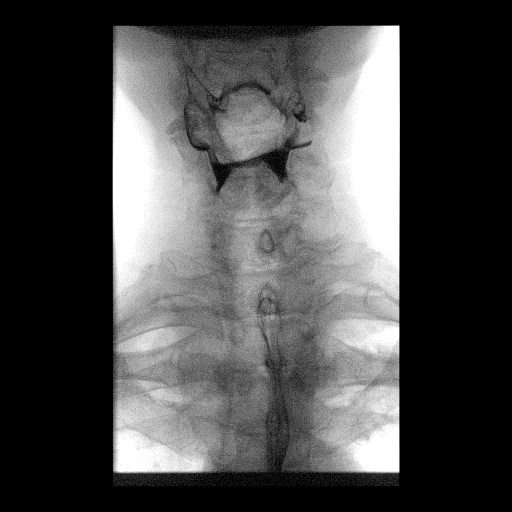

[cp_standard (5 of 9) · tomo slice 51/59.0]
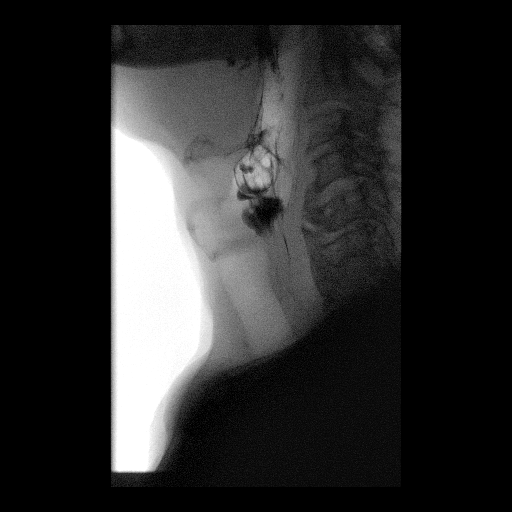

[fluoro_barium 2fps_bw (2 of 3) · tomo slice 6/7.0]
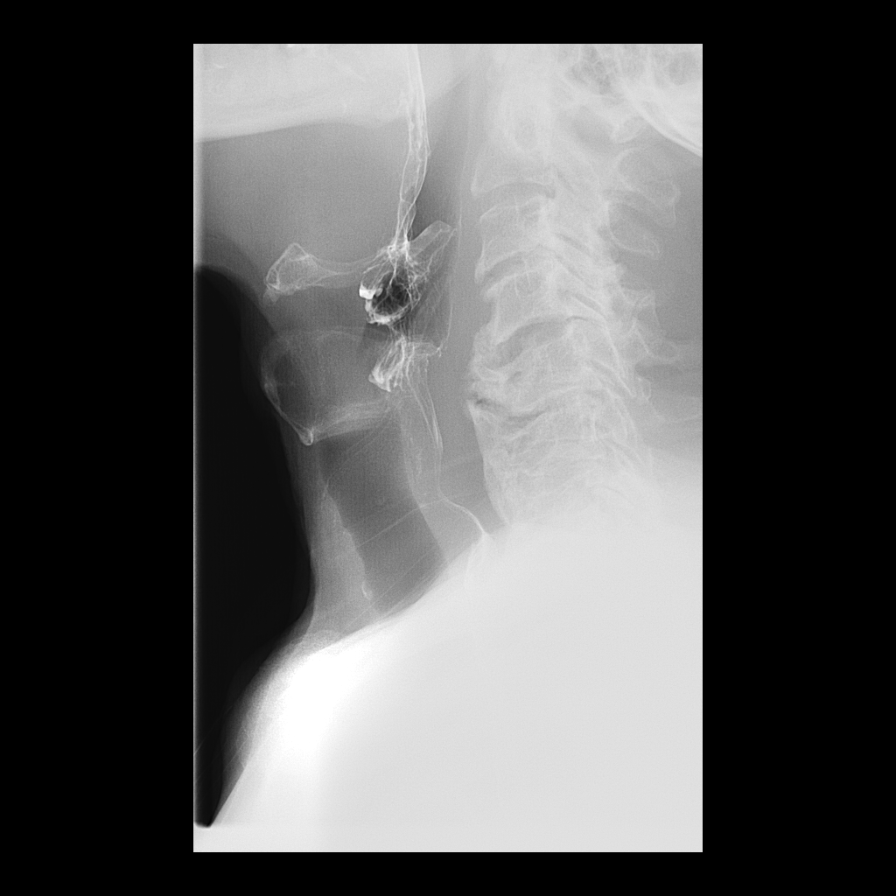

[fluoro_barium 2fps_bw (3 of 3)]
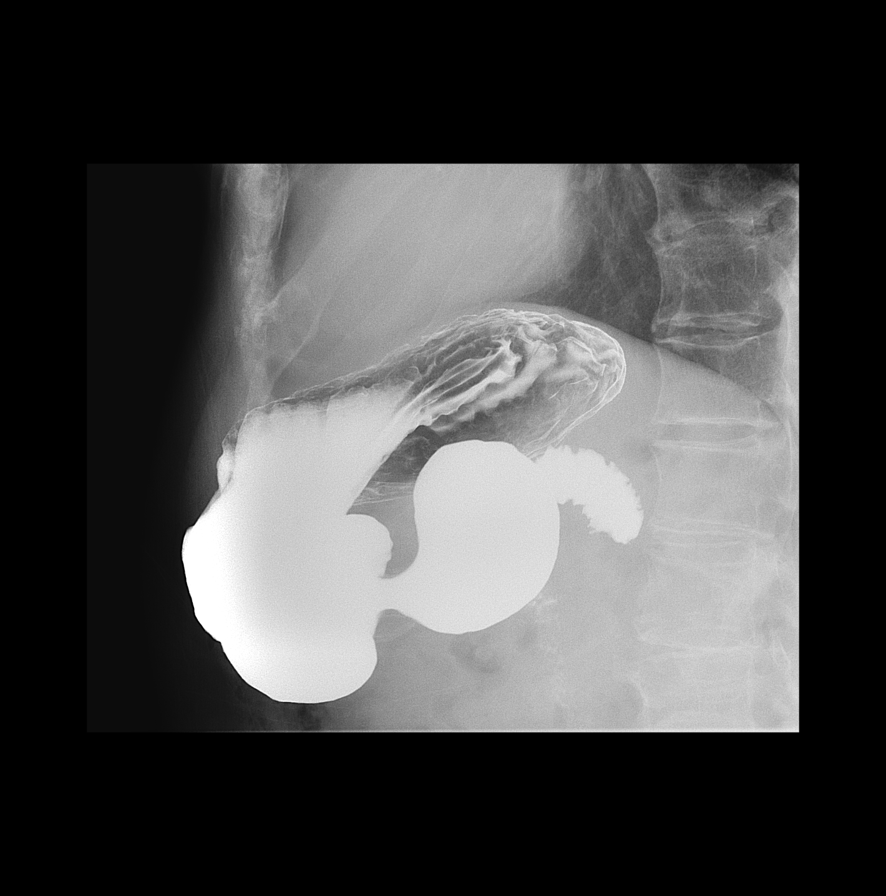

[cp_standard (6 of 9) · tomo slice 55/109.0]
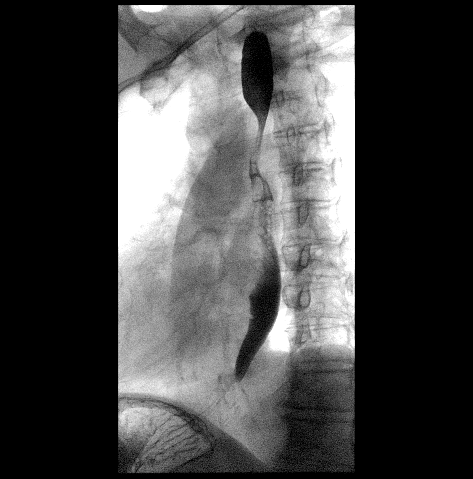

[cp_standard (7 of 9) · tomo slice 2/123.0]
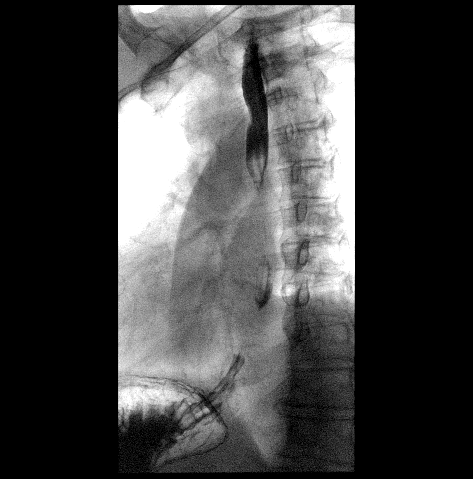

[cp_standard (8 of 9) · tomo slice 9/55.0]
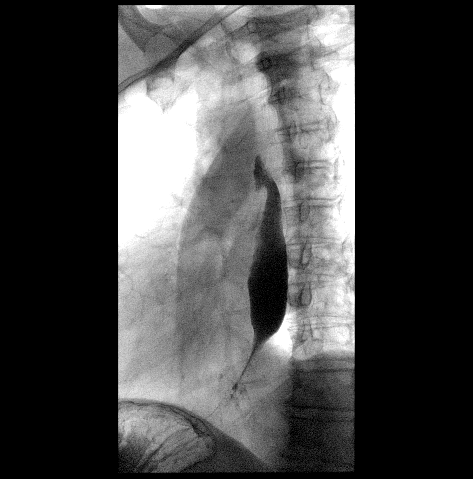

[cp_standard (9 of 9)]
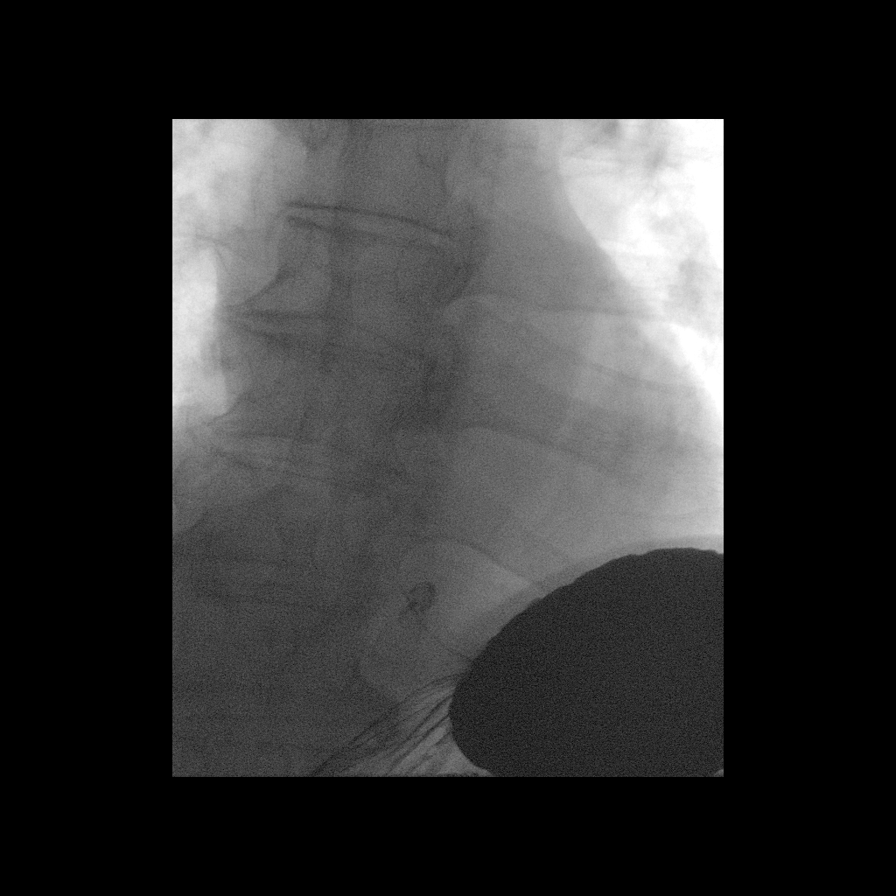

[12 of 24 positions shown; findings below may reference images not displayed]

FINDINGS: Preliminary KUB demonstrates a nonobstructive bowel gas pattern.

A double contrast study was undertaken and the patient tolerated
this well and without difficulty.

The pharyngeal phase of swallowing demonstrates pooling of contrast
in the vallecula and piriform sinuses. There is mild lateral
protrusion of the lateral hypopharyngeal walls. No aspiration or
penetration. No proximal esophageal diverticulum.

Primary peristaltic waves in the esophagus were normal. There is a
smooth, short segment of the distal esophagus which never fully
distends and may represent a small stricture. The segment measures
approximately 2.0 cm. No esophageal ulceration.

No gastroesophageal reflux was noted during the course of the
examination. Small hiatal hernia. Gastric morphology and appearance
appear normal. Proximal duodenum appears normal.

A 13 mm barium tablet passed without difficulty into the stomach.
IMPRESSION: 1. Retention of contrast material within the vallecula and piriform
sinuses during the pharyngeal phase of swallowing. No aspiration or
penetration. No proximal esophageal diverticulum.
2. Smooth, short segment of distal esophagus which never fully
distends, and may represent a small stricture. However, a 13 mm
barium tablet passed without difficulty into the stomach.
3. Small hiatal hernia.

## 2019-08-18 ENCOUNTER — Other Ambulatory Visit: Payer: Self-pay

## 2019-08-18 ENCOUNTER — Other Ambulatory Visit: Payer: Self-pay | Admitting: Endocrinology

## 2019-08-18 ENCOUNTER — Other Ambulatory Visit (INDEPENDENT_AMBULATORY_CARE_PROVIDER_SITE_OTHER): Payer: Medicare Other

## 2019-08-18 DIAGNOSIS — E119 Type 2 diabetes mellitus without complications: Secondary | ICD-10-CM | POA: Diagnosis not present

## 2019-08-18 DIAGNOSIS — E7800 Pure hypercholesterolemia, unspecified: Secondary | ICD-10-CM

## 2019-08-18 LAB — COMPREHENSIVE METABOLIC PANEL
ALT: 12 U/L (ref 0–53)
AST: 15 U/L (ref 0–37)
Albumin: 4.3 g/dL (ref 3.5–5.2)
Alkaline Phosphatase: 90 U/L (ref 39–117)
BUN: 13 mg/dL (ref 6–23)
CO2: 28 mEq/L (ref 19–32)
Calcium: 9.5 mg/dL (ref 8.4–10.5)
Chloride: 103 mEq/L (ref 96–112)
Creatinine, Ser: 0.9 mg/dL (ref 0.40–1.50)
GFR: 99.57 mL/min (ref >60.00)
Glucose, Bld: 81 mg/dL (ref 70–99)
Potassium: 3.9 mEq/L (ref 3.5–5.1)
Sodium: 138 mEq/L (ref 135–145)
Total Bilirubin: 0.3 mg/dL (ref 0.2–1.2)
Total Protein: 7.7 g/dL (ref 6.0–8.3)

## 2019-08-18 LAB — URINALYSIS, ROUTINE W REFLEX MICROSCOPIC
Bilirubin Urine: NEGATIVE
Hgb urine dipstick: NEGATIVE
Ketones, ur: NEGATIVE
Leukocytes,Ua: NEGATIVE
Nitrite: NEGATIVE
RBC / HPF: NONE SEEN (ref 0–?)
Specific Gravity, Urine: 1.015 (ref 1.000–1.030)
Total Protein, Urine: NEGATIVE
Urine Glucose: NEGATIVE
Urobilinogen, UA: 0.2 (ref 0.0–1.0)
WBC, UA: NONE SEEN (ref 0–?)
pH: 6 (ref 5.0–8.0)

## 2019-08-18 LAB — HEMOGLOBIN A1C: Hgb A1c MFr Bld: 6.3 % (ref 4.6–6.5)

## 2019-08-18 LAB — LIPID PANEL
Cholesterol: 123 mg/dL (ref 0–200)
HDL: 39.4 mg/dL (ref >39.00)
LDL Cholesterol: 54 mg/dL (ref 0–99)
NonHDL: 83.6
Total CHOL/HDL Ratio: 3
Triglycerides: 146 mg/dL (ref 0.0–149.0)
VLDL: 29.2 mg/dL (ref 0.0–40.0)

## 2019-08-18 LAB — MICROALBUMIN / CREATININE URINE RATIO
Creatinine,U: 71.4 mg/dL
Microalb Creat Ratio: 1 mg/g (ref 0.0–30.0)
Microalb, Ur: <0.7 mg/dL (ref 0.0–1.9)

## 2019-08-23 ENCOUNTER — Encounter: Payer: Self-pay | Admitting: Endocrinology

## 2019-08-23 ENCOUNTER — Ambulatory Visit (INDEPENDENT_AMBULATORY_CARE_PROVIDER_SITE_OTHER): Payer: Medicare Other | Admitting: Endocrinology

## 2019-08-23 ENCOUNTER — Other Ambulatory Visit: Payer: Self-pay | Admitting: Endocrinology

## 2019-08-23 ENCOUNTER — Other Ambulatory Visit: Payer: Self-pay

## 2019-08-23 VITALS — BP 126/72 | HR 88 | Ht 70.5 in | Wt 180.6 lb

## 2019-08-23 DIAGNOSIS — E119 Type 2 diabetes mellitus without complications: Secondary | ICD-10-CM | POA: Diagnosis not present

## 2019-08-23 DIAGNOSIS — E7800 Pure hypercholesterolemia, unspecified: Secondary | ICD-10-CM | POA: Diagnosis not present

## 2019-08-23 DIAGNOSIS — I10 Essential (primary) hypertension: Secondary | ICD-10-CM | POA: Diagnosis not present

## 2019-08-23 MED ORDER — ACCU-CHEK FASTCLIX LANCETS MISC: 2 refills | Status: AC

## 2019-08-23 MED ORDER — ACCU-CHEK GUIDE VI STRP: ORAL_STRIP | 2 refills | Status: DC

## 2019-08-23 MED ORDER — FREESTYLE LITE TEST VI STRP: ORAL_STRIP | 3 refills | Status: DC

## 2019-08-23 NOTE — Patient Instructions (Signed)
Check blood sugars on waking up 1-2  days a week  Also check blood sugars about 2 hours after meals and do this after different meals by rotation  Recommended blood sugar levels on waking up are 80-130 and about 2 hours after meal is 130-160  Please bring your blood sugar monitor to each visit, thank you  Walk daily

## 2019-08-23 NOTE — Progress Notes (Signed)
Patient ID: Derek [name], male   DOB: June 24, 1946, 74 y.o.   MRN: [id number]           Reason for Appointment: follow-up   History of Present Illness   Diagnosis: Type 2 DIABETES MELITUS, date of diagnosis: 2008      Past history: Although his glucose was 600 at diagnosis he was initially treated with metformin and subsequently had relatively milder diabetes Januvia was added to his regimen in 2011  Recent history:  Current non-insulin hypoglycemic drugs: Janumet 50/1000 once daily  A1c is stable at 6.3 compared to previous 1 in October 2019  He has not been seen in follow-up since 06/2018   He did bring his monitor for download but has only few readings in the last month  Lab glucose was 81 done late morning before lunch  He thinks he is generally watching his diet and eating healthy with the help of his wife and not a lot of fast food  He has apparently lost about 4 pounds since his last visit  However has not done any regular walking as he does not like to go out in the cold weather  No side effects from Janumet, still takes only 50/1000 mg daily  Side effects from medications: None       Monitors blood glucose:  less than once a day.    Glucometer:  FreeStyle            Blood Glucose readings by download: Only 2 readings of 117 and 137 done in the afternoon on bedtime Only up to 180 after eating oatmeal           Dietician visit: Most recent: Unknown           Wt Readings from Last 3 Encounters:  08/23/19 180 lb 9.6 oz (81.9 kg)  08/19/18 178 lb (80.7 kg)  07/05/18 184 lb (83.5 kg)   Lab Results  Component Value Date   HGBA1C 6.3 08/18/2019   HGBA1C 6.3 05/04/2018   HGBA1C 6.1 10/29/2017   Lab Results  Component Value Date   MICROALBUR <0.7 08/18/2019   LDLCALC 54 08/18/2019   CREATININE 0.90 08/18/2019    Lab on 08/18/2019  Component Date Value Ref Range Status  . Microalb, Ur 08/18/2019 <0.7  0.0 - 1.9 mg/dL Final  . Creatinine,U 08/67/6195 71.4   mg/dL Final  . Microalb Creat Ratio 08/18/2019 1.0  0.0 - 30.0 mg/g Final  . Cholesterol 08/18/2019 123  0 - 200 mg/dL Final   ATP III Classification       Desirable:  < 200 mg/dL               Borderline High:  200 - 239 mg/dL          High:  > = 093 mg/dL  . Triglycerides 08/18/2019 146.0  0.0 - 149.0 mg/dL Final   Normal:  <267 mg/dLBorderline High:  150 - 199 mg/dL  . HDL 08/18/2019 39.40  >39.00 mg/dL Final  . VLDL 12/45/8099 29.2  0.0 - 40.0 mg/dL Final  . LDL Cholesterol 08/18/2019 54  0 - 99 mg/dL Final  . Total CHOL/HDL Ratio 08/18/2019 3   Final                  Men          Women1/2 Average Risk     3.4          3.3Average Risk  5.0          4.42X Average Risk          9.6          7.13X Average Risk          15.0          11.0                      . NonHDL 08/18/2019 83.60   Final   NOTE:  Non-HDL goal should be 30 mg/dL higher than patient's LDL goal (i.e. LDL goal of < 70 mg/dL, would have non-HDL goal of < 100 mg/dL)  . Color, Urine 08/18/2019 YELLOW  Yellow;Lt. Yellow;Straw;Dark Yellow;Amber;Green;Red;Brown Final  . APPearance 08/18/2019 CLEAR  Clear;Turbid;Slightly Cloudy;Cloudy Final  . Specific Gravity, Urine 08/18/2019 1.015  1.000 - 1.030 Final  . pH 08/18/2019 6.0  5.0 - 8.0 Final  . Total Protein, Urine 08/18/2019 NEGATIVE  Negative Final  . Urine Glucose 08/18/2019 NEGATIVE  Negative Final  . Ketones, ur 08/18/2019 NEGATIVE  Negative Final  . Bilirubin Urine 08/18/2019 NEGATIVE  Negative Final  . Hgb urine dipstick 08/18/2019 NEGATIVE  Negative Final  . Urobilinogen, UA 08/18/2019 0.2  0.0 - 1.0 Final  . Leukocytes,Ua 08/18/2019 NEGATIVE  Negative Final  . Nitrite 08/18/2019 NEGATIVE  Negative Final  . WBC, UA 08/18/2019 none seen  0-2/hpf Final  . RBC / HPF 08/18/2019 none seen  0-2/hpf Final  . Sodium 08/18/2019 138  135 - 145 mEq/L Final  . Potassium 08/18/2019 3.9  3.5 - 5.1 mEq/L Final  . Chloride 08/18/2019 103  96 - 112 mEq/L Final  . CO2  08/18/2019 28  19 - 32 mEq/L Final  . Glucose, Bld 08/18/2019 81  70 - 99 mg/dL Final  . BUN 08/18/2019 13  6 - 23 mg/dL Final  . Creatinine, Ser 08/18/2019 0.90  0.40 - 1.50 mg/dL Final  . Total Bilirubin 08/18/2019 0.3  0.2 - 1.2 mg/dL Final  . Alkaline Phosphatase 08/18/2019 90  39 - 117 U/L Final  . AST 08/18/2019 15  0 - 37 U/L Final  . ALT 08/18/2019 12  0 - 53 U/L Final  . Total Protein 08/18/2019 7.7  6.0 - 8.3 g/dL Final  . Albumin 08/18/2019 4.3  3.5 - 5.2 g/dL Final  . GFR 08/18/2019 99.57  >60.00 mL/min Final  . Calcium 08/18/2019 9.5  8.4 - 10.5 mg/dL Final  . Hgb A1c MFr Bld 08/18/2019 6.3  4.6 - 6.5 % Final   Glycemic Control Guidelines for People with Diabetes:Non Diabetic:  <6%Goal of Therapy: <7%Additional Action Suggested:  >8%     Allergies as of 08/23/2019      Reactions   Penicillins Rash      Medication List       Accurate as of August 23, 2019  1:22 PM. If you have any questions, ask your nurse or doctor.        aspirin 81 MG tablet Take 81 mg by mouth daily.   chlordiazePOXIDE 25 MG capsule Commonly known as: LIBRIUM Take 25 mg by mouth daily as needed.   esomeprazole 20 MG capsule Commonly known as: NEXIUM Take 20 mg by mouth 2 (two) times daily.   glucose blood test strip Commonly known as: FREESTYLE LITE Use to check blood sugars 1 times per day dx code E11.9   HYDROcodone-acetaminophen 5-325 MG tablet Commonly known as: NORCO/VICODIN Take 1 tablet by mouth every 6 (six) hours as needed  for pain.   irbesartan-hydrochlorothiazide 150-12.5 MG tablet Commonly known as: AVALIDE Take 1 tablet by mouth daily at 12 noon.   Janumet 50-1000 MG tablet Generic drug: sitaGLIPtin-metformin TAKE 1 TABLET BY MOUTH EVERY DAY   Lumigan 0.01 % Soln Generic drug: bimatoprost   meclizine 25 MG tablet Commonly known as: ANTIVERT Take 25 mg by mouth as needed.   metoprolol succinate 25 MG 24 hr tablet Commonly known as: TOPROL-XL Take 1 tablet (25  mg total) by mouth daily.   potassium chloride 10 MEQ tablet Commonly known as: KLOR-CON Take 10 mEq by mouth 2 (two) times daily.   sertraline 100 MG tablet Commonly known as: ZOLOFT Take 100 mg by mouth daily.   simvastatin 20 MG tablet Commonly known as: ZOCOR TAKE 1 TABLET BY MOUTH EVERYDAY AT BEDTIME   Symbicort 160-4.5 MCG/ACT inhaler Generic drug: budesonide-formoterol INHALE 2 PUFFS INTO THE LUNGS EVERY DAY   tamsulosin 0.4 MG Caps capsule Commonly known as: FLOMAX Take 0.4 mg by mouth.   triamcinolone ointment 0.1 % Commonly known as: KENALOG Apply 1 application topically 2 (two) times daily.   Ventolin HFA 108 (90 Base) MCG/ACT inhaler Generic drug: albuterol   Viagra 100 MG tablet Generic drug: sildenafil Take 100 mg by mouth as needed for erectile dysfunction.   zolpidem 10 MG tablet Commonly known as: AMBIEN Take 10 mg by mouth at bedtime as needed for sleep.       Allergies:  Allergies  Allergen Reactions  . Penicillins Rash    Past Medical History:  Diagnosis Date  . Allergy   . Anxiety   . Arrhythmia   . Asthma   . Cataract    bilateral - just watching - no treatment yet  . Chronic headaches   . COPD (chronic obstructive pulmonary disease) (HCC)   . COPD (chronic obstructive pulmonary disease) (HCC)   . Depression   . Diabetes (HCC)   . Diverticulosis   . GERD (gastroesophageal reflux disease)   . Glaucoma   . Hyperlipidemia   . Hypertension   . IBS (irritable bowel syndrome)   . Pneumonia   . Sleep apnea    does not use CPAP    Past Surgical History:  Procedure Laterality Date  . COLONOSCOPY    . ESOPHAGOGASTRODUODENOSCOPY ENDOSCOPY    . LUMBAR SPINE SURGERY  2010  . ROTATOR CUFF REPAIR  2008  . urology surgery      Family History  Problem Relation Age of Onset  . Arthritis Mother   . Hypertension Mother   . Heart failure Father   . Diabetes Other   . Colon cancer Neg Hx   . Colon polyps Neg Hx   . Stomach cancer  Neg Hx   . Rectal cancer Neg Hx     Social History:  reports that he has never smoked. He has never used smokeless tobacco. He reports current alcohol use of about 2.0 standard drinks of alcohol per week. He reports that he does not use drugs.  Review of Systems:  HYPERTENSION:   well controlled with Avalide, also followed by PCP regularly On Toprol 25 mg from cardiologist  HYPERLIPIDEMIA: The lipid abnormality consists of elevated LDL and low HDL treated with simvastatin 40 LDL is below 70   Lab Results  Component Value Date   CHOL 123 08/18/2019   HDL 39.40 08/18/2019   LDLCALC 54 08/18/2019   TRIG 146.0 08/18/2019   CHOLHDL 3 08/18/2019    Diabetic foot exam 6/20,  also followed by podiatrist Last eye exam 10/20    Examination:   BP 126/72 (BP Location: Left Arm, Patient Position: Sitting, Cuff Size: Normal)   Pulse 88   Ht 5' 10.5" (1.791 m)   Wt 180 lb 9.6 oz (81.9 kg)   SpO2 96%   BMI 25.55 kg/m   Body mass index is 25.55 kg/m.   No ankle edema present   ASSESSMENT/ PLAN:   Diabetes type 2  See history of present illness for  discussion of current diabetes management, blood sugar patterns and problems identified  A1c is very stable at 6.3  He has been treated with Janumet 50/1000, 1 tablet daily  He has been usually leading a healthy lifestyle with good diet Although he has not exercised much lately has not gained any weight Blood sugars are being checked only rarely and only has a high reading if he is eating oatmeal Has only 2 readings on his monitor recently   Hyperlipidemia: Again controlled with low LDL on simvastatin  Hypertension: Well controlled on Avalide with stable renal function and potassium  Advised him that he has no contraindication to taking the Covid vaccine and he should proceed with this  He will follow-up in 12 months unless he has any significant change in his blood sugars    Reather Littler 08/23/2019, 1:22 PM

## 2019-10-11 ENCOUNTER — Ambulatory Visit (INDEPENDENT_AMBULATORY_CARE_PROVIDER_SITE_OTHER): Payer: Medicare Other | Admitting: Podiatry

## 2019-10-11 ENCOUNTER — Encounter: Payer: Self-pay | Admitting: Podiatry

## 2019-10-11 ENCOUNTER — Other Ambulatory Visit: Payer: Self-pay

## 2019-10-11 VITALS — Temp 97.3°F

## 2019-10-11 DIAGNOSIS — M79675 Pain in left toe(s): Secondary | ICD-10-CM

## 2019-10-11 DIAGNOSIS — B351 Tinea unguium: Secondary | ICD-10-CM | POA: Diagnosis not present

## 2019-10-11 DIAGNOSIS — E119 Type 2 diabetes mellitus without complications: Secondary | ICD-10-CM

## 2019-10-11 DIAGNOSIS — M79674 Pain in right toe(s): Secondary | ICD-10-CM

## 2019-10-11 NOTE — Progress Notes (Signed)
This patient returns to my office for at risk foot care.  This patient requires this care by a professional since this patient will be at risk due to having diabetes.  This patient is unable to cut nails himself since the patient cannot reach his nails.These nails are painful walking and wearing shoes.  This patient presents for at risk foot care today.  General Appearance  Alert, conversant and in no acute stress.  Vascular  Dorsalis pedis and posterior tibial  pulses are palpable  bilaterally.  Capillary return is within normal limits  bilaterally. Temperature is within normal limits  bilaterally.  Neurologic  Senn-Weinstein monofilament wire test within normal limits  bilaterally. Muscle power within normal limits bilaterally.  Nails Thick disfigured discolored nails with subungual debris  from second  to fifth toes bilaterally. No evidence of bacterial infection or drainage bilaterally.  Orthopedic  No limitations of motion  feet .  No crepitus or effusions noted.  No bony pathology or digital deformities noted.  Skin  normotropic skin with no porokeratosis noted bilaterally.  No signs of infections or ulcers noted.     Onychomycosis  Pain in right toes  Pain in left toes  Consent was obtained for treatment procedures.   Mechanical debridement of nails 2-5  bilaterally performed with a nail nipper.  Filed with dremel without incident.    Return office visit    3 months.                 Told patient to return for periodic foot care and evaluation due to potential at risk complications.   Bethany Cumming DPM  

## 2019-11-03 ENCOUNTER — Telehealth: Payer: Self-pay | Admitting: *Deleted

## 2019-11-03 ENCOUNTER — Encounter: Payer: Self-pay | Admitting: Cardiovascular Disease

## 2019-11-03 ENCOUNTER — Telehealth (INDEPENDENT_AMBULATORY_CARE_PROVIDER_SITE_OTHER): Payer: Medicare Other | Admitting: Cardiovascular Disease

## 2019-11-03 VITALS — BP 134/82 | HR 72 | Ht 71.0 in | Wt 180.0 lb

## 2019-11-03 DIAGNOSIS — E785 Hyperlipidemia, unspecified: Secondary | ICD-10-CM

## 2019-11-03 DIAGNOSIS — I10 Essential (primary) hypertension: Secondary | ICD-10-CM | POA: Diagnosis not present

## 2019-11-03 DIAGNOSIS — E119 Type 2 diabetes mellitus without complications: Secondary | ICD-10-CM | POA: Diagnosis not present

## 2019-11-03 MED ORDER — METOPROLOL SUCCINATE ER 25 MG PO TB24: 25.0000 mg | ORAL_TABLET | Freq: Every day | ORAL | 3 refills | Status: DC

## 2019-11-03 NOTE — Patient Instructions (Signed)

## 2019-11-03 NOTE — Progress Notes (Signed)
Virtual Visit via Telephone Note   This visit type was conducted due to national recommendations for restrictions regarding the COVID-19 Pandemic (e.g. social distancing) in an effort to limit this patient's exposure and mitigate transmission in our community.  Due to his co-morbid illnesses, this patient is at least at moderate risk for complications without adequate follow up.  This format is felt to be most appropriate for this patient at this time.  The patient did not have access to video technology/had technical difficulties with video requiring transitioning to audio format only (telephone).  All issues noted in this document were discussed and addressed.  No physical exam could be performed with this format.  Please refer to the patient's chart for his  consent to telehealth for [facility].   The patient was identified using 2 identifiers.  Date:  11/03/2019   ID:  Derek [name], DOB 08-12-1945, MRN [id number]  Patient Location: Home Provider Location: Home  PCP:  Corine Shelter, MD  Cardiologist:  Thurmon Fair, MD  Electrophysiologist:  None   Evaluation Performed:  Follow-Up Visit  Chief Complaint:  HTN, HLP  History of Present Illness:    Derek [name] is a 74 y.o. male with type 2 diabetes mellitus, hypertension and dyslipidemia, despite healthy lifestyle and weight in optimal range.  He has done well this year.  He had an episode of vertigo in December that lasted for about a week, and this has occurred in the past every year or so.  It improves with Epley maneuvers.  The patient specifically denies any chest pain at rest exertion, dyspnea at rest or with exertion, orthopnea, paroxysmal nocturnal dyspnea, syncope, palpitations, focal neurological deficits, intermittent claudication, lower extremity edema, unexplained weight gain, cough, hemoptysis or wheezing.  He walks about 30 minutes a day at least 3 times a week.  He covers about 2 miles each time.  He had a  visit with Dr. Lucianne Muss not long ago and all his metabolic parameters were in the desirable range.  Hemoglobin A1c 6.3%, LDL cholesterol 54, creatinine 0.9.  Only the HDL was borderline low at 25.8.  Triglycerides were at the upper limit of normal 146.  His blood pressure is consistently well controlled usually in the mid 120s/low 70s.  The patient does not have symptoms concerning for COVID-19 infection (fever, chills, cough, or new shortness of breath).    Past Medical History:  Diagnosis Date  . Allergy   . Anxiety   . Arrhythmia   . Asthma   . Cataract    bilateral - just watching - no treatment yet  . Chronic headaches   . COPD (chronic obstructive pulmonary disease) (HCC)   . COPD (chronic obstructive pulmonary disease) (HCC)   . Depression   . Diabetes (HCC)   . Diverticulosis   . GERD (gastroesophageal reflux disease)   . Glaucoma   . Hyperlipidemia   . Hypertension   . IBS (irritable bowel syndrome)   . Pneumonia   . Sleep apnea    does not use CPAP   Past Surgical History:  Procedure Laterality Date  . COLONOSCOPY    . ESOPHAGOGASTRODUODENOSCOPY ENDOSCOPY    . LUMBAR SPINE SURGERY  2010  . ROTATOR CUFF REPAIR  2008  . urology surgery       Current Meds  Medication Sig  . aspirin 81 MG tablet Take 81 mg by mouth daily.  . chlordiazePOXIDE (LIBRIUM) 25 MG capsule Take 25 mg by mouth daily as needed.   Marland Kitchen  esomeprazole (NEXIUM) 20 MG capsule Take 20 mg by mouth 2 (two) times daily.  Marland Kitchen HYDROcodone-acetaminophen (NORCO/VICODIN) 5-325 MG per tablet Take 1 tablet by mouth every 6 (six) hours as needed for pain.  Marland Kitchen irbesartan-hydrochlorothiazide (AVALIDE) 150-12.5 MG per tablet Take 1 tablet by mouth daily at 12 noon.   Marland Kitchen JANUMET 50-1000 MG tablet TAKE 1 TABLET BY MOUTH EVERY DAY  . meclizine (ANTIVERT) 25 MG tablet Take 25 mg by mouth as needed.   . metoprolol succinate (TOPROL-XL) 25 MG 24 hr tablet Take 1 tablet (25 mg total) by mouth daily.  . potassium chloride  (K-DUR,KLOR-CON) 10 MEQ tablet Take 10 mEq by mouth daily.   . sertraline (ZOLOFT) 100 MG tablet Take 100 mg by mouth daily.  . sildenafil (VIAGRA) 100 MG tablet Take 100 mg by mouth as needed for erectile dysfunction.  . simvastatin (ZOCOR) 20 MG tablet TAKE 1 TABLET BY MOUTH EVERYDAY AT BEDTIME  . SYMBICORT 160-4.5 MCG/ACT inhaler INHALE 2 PUFFS INTO THE LUNGS EVERY DAY  . tamsulosin (FLOMAX) 0.4 MG CAPS capsule Take 0.4 mg by mouth.  . triamcinolone ointment (KENALOG) 0.1 % Apply 1 application topically 2 (two) times daily.  . VENTOLIN HFA 108 (90 BASE) MCG/ACT inhaler   . zolpidem (AMBIEN) 10 MG tablet Take 10 mg by mouth at bedtime as needed for sleep.  . [DISCONTINUED] metoprolol succinate (TOPROL-XL) 25 MG 24 hr tablet Take 1 tablet (25 mg total) by mouth daily.     Allergies:   Penicillins   Social History   Tobacco Use  . Smoking status: Never Smoker  . Smokeless tobacco: Never Used  Substance Use Topics  . Alcohol use: Yes    Alcohol/week: 2.0 standard drinks    Types: 2 Shots of liquor per week  . Drug use: No     Family Hx: The patient's family history includes Arthritis in his mother; Diabetes in an other family member; Heart failure in his father; Hypertension in his mother. There is no history of Colon cancer, Colon polyps, Stomach cancer, or Rectal cancer.  ROS:   Please see the history of present illness.     All other systems reviewed and are negative.   Prior CV studies:   The following studies were reviewed today: Labs from Dr. Dwyane Dee  Labs/Other Tests and Data Reviewed:    EKG:  No ECG reviewed.  Recent Labs: 08/18/2019: ALT 12; BUN 13; Creatinine, Ser 0.90; Potassium 3.9; Sodium 138   Recent Lipid Panel Lab Results  Component Value Date/Time   CHOL 123 08/18/2019 01:46 PM   TRIG 146.0 08/18/2019 01:46 PM   HDL 39.40 08/18/2019 01:46 PM   CHOLHDL 3 08/18/2019 01:46 PM   LDLCALC 54 08/18/2019 01:46 PM    Wt Readings from Last 3 Encounters:   11/03/19 180 lb (81.6 kg)  08/23/19 180 lb 9.6 oz (81.9 kg)  08/19/18 178 lb (80.7 kg)     Objective:    Vital Signs:  BP 134/82   Pulse 72   Ht 5\' 11"  (1.803 m)   Wt 180 lb (81.6 kg)   BMI 25.10 kg/m    VITAL SIGNS:  reviewed Unable to examine  ASSESSMENT & PLAN:    1. HTN: Very well controlled. 2. DM: Glycemic control in acceptable range 3. HLP: Has borderline features for the metabolic syndrome/insulin resistance with a low HDL and borderline triglycerides but with excellent LDL cholesterol on current medications.  Advised a slight increase in exercise level to a minimum target  of 2-1/2 hours a week.   COVID-19 Education: The signs and symptoms of COVID-19 were discussed with the patient and how to seek care for testing (follow up with PCP or arrange E-visit).  The importance of social distancing was discussed today.  Time:   Today, I have spent 11 minutes with the patient with telehealth technology discussing the above problems.     Medication Adjustments/Labs and Tests Ordered: Current medicines are reviewed at length with the patient today.  Concerns regarding medicines are outlined above.   Tests Ordered: No orders of the defined types were placed in this encounter.   Medication Changes: Meds ordered this encounter  Medications  . metoprolol succinate (TOPROL-XL) 25 MG 24 hr tablet    Sig: Take 1 tablet (25 mg total) by mouth daily.    Dispense:  90 tablet    Refill:  3    Follow Up:  In Person 1 year  Signed, Thurmon Fair, MD  11/03/2019 1:48 PM    [facility]

## 2019-11-03 NOTE — Telephone Encounter (Signed)
  Patient Consent for Virtual Visit         Derek [name] has provided verbal consent on 11/03/2019 for a virtual visit (video or telephone).   CONSENT FOR VIRTUAL VISIT FOR:  Derek [name]  By participating in this virtual visit I agree to the following:  I hereby voluntarily request, consent and authorize [facility] and its employed or contracted physicians, physician assistants, nurse practitioners or other licensed health care professionals (the Practitioner), to provide me with telemedicine health care services (the "Services") as deemed necessary by the treating Practitioner. I acknowledge and consent to receive the Services by the Practitioner via telemedicine. I understand that the telemedicine visit will involve communicating with the Practitioner through live audiovisual communication technology and the disclosure of certain medical information by electronic transmission. I acknowledge that I have been given the opportunity to request an in-person assessment or other available alternative prior to the telemedicine visit and am voluntarily participating in the telemedicine visit.  I understand that I have the right to withhold or withdraw my consent to the use of telemedicine in the course of my care at any time, without affecting my right to future care or treatment, and that the Practitioner or I may terminate the telemedicine visit at any time. I understand that I have the right to inspect all information obtained and/or recorded in the course of the telemedicine visit and may receive copies of available information for a reasonable fee.  I understand that some of the potential risks of receiving the Services via telemedicine include:  Marland Kitchen Delay or interruption in medical evaluation due to technological equipment failure or disruption; . Information transmitted may not be sufficient (e.g. poor resolution of images) to allow for appropriate medical decision making by the Practitioner; and/or    . In rare instances, security protocols could fail, causing a breach of personal health information.  Furthermore, I acknowledge that it is my responsibility to provide information about my medical history, conditions and care that is complete and accurate to the best of my ability. I acknowledge that Practitioner's advice, recommendations, and/or decision may be based on factors not within their control, such as incomplete or inaccurate data provided by me or distortions of diagnostic images or specimens that may result from electronic transmissions. I understand that the practice of medicine is not an exact science and that Practitioner makes no warranties or guarantees regarding treatment outcomes. I acknowledge that a copy of this consent can be made available to me via my patient portal [facility] MyChart), or I can request a printed copy by calling the office of [facility].    I understand that my insurance will be billed for this visit.   I have read or had this consent read to me. . I understand the contents of this consent, which adequately explains the benefits and risks of the Services being provided via telemedicine.  . I have been provided ample opportunity to ask questions regarding this consent and the Services and have had my questions answered to my satisfaction. . I give my informed consent for the services to be provided through the use of telemedicine in my medical care

## 2020-01-05 ENCOUNTER — Ambulatory Visit: Payer: Medicare Other | Attending: Internal Medicine

## 2020-01-05 DIAGNOSIS — Z23 Encounter for immunization: Secondary | ICD-10-CM

## 2020-01-05 NOTE — Progress Notes (Signed)
   Covid-19 Vaccination Clinic  Name:  Derek [name]    MRN: [id number] DOB: 1946/04/10  01/05/2020  [name] was observed post Covid-19 immunization for 15 minutes without incident. He was provided with Vaccine Information Sheet and instruction to access the V-Safe system.   [name] was instructed to call 911 with any severe reactions post vaccine: Marland Kitchen Difficulty breathing  . Swelling of face and throat  . A fast heartbeat  . A bad rash all over body  . Dizziness and weakness   Immunizations Administered    Name Date Dose VIS Date Route   Pfizer COVID-19 Vaccine 01/05/2020  2:00 PM 0.3 mL 09/07/2018 Intramuscular   Manufacturer: [employer], [employer]   Lot: [id number]   NDC: [address]-1

## 2020-01-11 ENCOUNTER — Ambulatory Visit (INDEPENDENT_AMBULATORY_CARE_PROVIDER_SITE_OTHER): Payer: Medicare Other | Admitting: Podiatry

## 2020-01-11 ENCOUNTER — Other Ambulatory Visit: Payer: Self-pay

## 2020-01-11 ENCOUNTER — Encounter: Payer: Self-pay | Admitting: Podiatry

## 2020-01-11 DIAGNOSIS — M79675 Pain in left toe(s): Secondary | ICD-10-CM

## 2020-01-11 DIAGNOSIS — M79674 Pain in right toe(s): Secondary | ICD-10-CM

## 2020-01-11 DIAGNOSIS — E119 Type 2 diabetes mellitus without complications: Secondary | ICD-10-CM | POA: Diagnosis not present

## 2020-01-11 DIAGNOSIS — B351 Tinea unguium: Secondary | ICD-10-CM | POA: Diagnosis not present

## 2020-01-11 NOTE — Progress Notes (Signed)
This patient returns to my office for at risk foot care.  This patient requires this care by a professional since this patient will be at risk due to having diabetes.  This patient is unable to cut nails himself since the patient cannot reach his nails.These nails are painful walking and wearing shoes.  This patient presents for at risk foot care today.  General Appearance  Alert, conversant and in no acute stress.  Vascular  Dorsalis pedis and posterior tibial  pulses are palpable  bilaterally.  Capillary return is within normal limits  bilaterally. Temperature is within normal limits  bilaterally.  Neurologic  Senn-Weinstein monofilament wire test within normal limits  bilaterally. Muscle power within normal limits bilaterally.  Nails Thick disfigured discolored nails with subungual debris  from second  to fifth toes bilaterally. No evidence of bacterial infection or drainage bilaterally.  Orthopedic  No limitations of motion  feet .  No crepitus or effusions noted.  No bony pathology or digital deformities noted.  Skin  normotropic skin with no porokeratosis noted bilaterally.  No signs of infections or ulcers noted.     Onychomycosis  Pain in right toes  Pain in left toes  Consent was obtained for treatment procedures.   Mechanical debridement of nails 2-5  bilaterally performed with a nail nipper.  Filed with dremel without incident.    Return office visit    3 months.                 Told patient to return for periodic foot care and evaluation due to potential at risk complications.   Hailly Fess DPM  

## 2020-01-13 ENCOUNTER — Other Ambulatory Visit: Payer: Self-pay | Admitting: Endocrinology

## 2020-01-26 ENCOUNTER — Ambulatory Visit: Payer: Medicare Other | Attending: Internal Medicine

## 2020-01-26 DIAGNOSIS — Z23 Encounter for immunization: Secondary | ICD-10-CM

## 2020-01-26 NOTE — Progress Notes (Signed)
   Covid-19 Vaccination Clinic  Name:  Derek [name]    MRN: [id number] DOB: 1946-05-08  01/26/2020  [name] was observed post Covid-19 immunization for 15 minutes without incident. He was provided with Vaccine Information Sheet and instruction to access the V-Safe system.   [name] was instructed to call 911 with any severe reactions post vaccine: Marland Kitchen Difficulty breathing  . Swelling of face and throat  . A fast heartbeat  . A bad rash all over body  . Dizziness and weakness   Immunizations Administered    Name Date Dose VIS Date Route   Pfizer COVID-19 Vaccine 01/26/2020  4:29 PM 0.3 mL 09/07/2018 Intramuscular   Manufacturer: [employer], [employer]   Lot: [id number]   NDC: [address]-1

## 2020-03-22 ENCOUNTER — Encounter: Payer: Self-pay | Admitting: Podiatry

## 2020-04-18 ENCOUNTER — Encounter: Payer: Self-pay | Admitting: Podiatry

## 2020-04-18 ENCOUNTER — Other Ambulatory Visit: Payer: Self-pay

## 2020-04-18 ENCOUNTER — Ambulatory Visit (INDEPENDENT_AMBULATORY_CARE_PROVIDER_SITE_OTHER): Payer: Medicare Other | Admitting: Podiatry

## 2020-04-18 DIAGNOSIS — M79675 Pain in left toe(s): Secondary | ICD-10-CM

## 2020-04-18 DIAGNOSIS — B351 Tinea unguium: Secondary | ICD-10-CM | POA: Diagnosis not present

## 2020-04-18 DIAGNOSIS — E119 Type 2 diabetes mellitus without complications: Secondary | ICD-10-CM | POA: Diagnosis not present

## 2020-04-18 DIAGNOSIS — M79674 Pain in right toe(s): Secondary | ICD-10-CM | POA: Diagnosis not present

## 2020-04-18 NOTE — Progress Notes (Signed)
This patient returns to my office for at risk foot care.  This patient requires this care by a professional since this patient will be at risk due to having diabetes.  This patient is unable to cut nails himself since the patient cannot reach his nails.These nails are painful walking and wearing shoes.  This patient presents for at risk foot care today.  General Appearance  Alert, conversant and in no acute stress.  Vascular  Dorsalis pedis and posterior tibial  pulses are palpable  bilaterally.  Capillary return is within normal limits  bilaterally. Temperature is within normal limits  bilaterally.  Neurologic  Senn-Weinstein monofilament wire test within normal limits  bilaterally. Muscle power within normal limits bilaterally.  Nails Thick disfigured discolored nails with subungual debris  from second  to fifth toes bilaterally. No evidence of bacterial infection or drainage bilaterally.  Orthopedic  No limitations of motion  feet .  No crepitus or effusions noted.  No bony pathology or digital deformities noted.  Skin  normotropic skin with no porokeratosis noted bilaterally.  No signs of infections or ulcers noted.     Onychomycosis  Pain in right toes  Pain in left toes  Consent was obtained for treatment procedures.   Mechanical debridement of nails 2-5  bilaterally performed with a nail nipper.  Filed with dremel without incident.    Return office visit    3 months.                 Told patient to return for periodic foot care and evaluation due to potential at risk complications.   Eldana Isip DPM  

## 2020-05-01 LAB — HM DIABETES EYE EXAM

## 2020-05-03 ENCOUNTER — Encounter: Payer: Self-pay | Admitting: *Deleted

## 2020-07-12 ENCOUNTER — Other Ambulatory Visit: Payer: Self-pay | Admitting: Endocrinology

## 2020-07-19 ENCOUNTER — Telehealth: Payer: Self-pay | Admitting: Cardiovascular Disease

## 2020-07-19 MED ORDER — METOPROLOL SUCCINATE ER 25 MG PO TB24: 25.0000 mg | ORAL_TABLET | Freq: Every day | ORAL | 3 refills | Status: DC

## 2020-07-19 NOTE — Telephone Encounter (Signed)
°*  STAT* If patient is at the pharmacy, call can be transferred to refill team.   1. Which medications need to be refilled? (please list name of each medication and dose if known) Metoprolol 25 mg patient states the patient should not be taking both of these so which one do he need to take   2. Which pharmacy/location (including street and city if local pharmacy) is medication to be sent to? [facility]  3. Do they need a 30 day or 90 day supply? Not sure please call patient because he do not know which one he should be taken [phone].

## 2020-07-19 NOTE — Telephone Encounter (Signed)
Spoke with pt, he has a bottle of metoprolol tartrate and is confused about whether he should take it or succ. Patient aware the tartrate will need to be taken twice daily and the succ is once daily. Io did not see tartrate in his chart. Patient would prefer to take the succ. New script sent to the pharmacy

## 2020-07-25 ENCOUNTER — Encounter: Payer: Self-pay | Admitting: Podiatry

## 2020-07-25 ENCOUNTER — Ambulatory Visit (INDEPENDENT_AMBULATORY_CARE_PROVIDER_SITE_OTHER): Payer: Medicare Other | Admitting: Podiatry

## 2020-07-25 ENCOUNTER — Other Ambulatory Visit: Payer: Self-pay

## 2020-07-25 DIAGNOSIS — M79674 Pain in right toe(s): Secondary | ICD-10-CM

## 2020-07-25 DIAGNOSIS — B351 Tinea unguium: Secondary | ICD-10-CM | POA: Diagnosis not present

## 2020-07-25 DIAGNOSIS — E119 Type 2 diabetes mellitus without complications: Secondary | ICD-10-CM

## 2020-07-25 DIAGNOSIS — M79675 Pain in left toe(s): Secondary | ICD-10-CM

## 2020-07-25 NOTE — Progress Notes (Signed)
This patient returns to my office for at risk foot care.  This patient requires this care by a professional since this patient will be at risk due to having diabetes.  This patient is unable to cut nails himself since the patient cannot reach his nails.These nails are painful walking and wearing shoes.  This patient presents for at risk foot care today.  General Appearance  Alert, conversant and in no acute stress.  Vascular  Dorsalis pedis and posterior tibial  pulses are palpable  bilaterally.  Capillary return is within normal limits  bilaterally. Temperature is within normal limits  bilaterally.  Neurologic  Senn-Weinstein monofilament wire test within normal limits  bilaterally. Muscle power within normal limits bilaterally.  Nails Thick disfigured discolored nails with subungual debris  from second  to fifth toes bilaterally. No evidence of bacterial infection or drainage bilaterally.  Orthopedic  No limitations of motion  feet .  No crepitus or effusions noted.  No bony pathology or digital deformities noted.  Skin  normotropic skin with no porokeratosis noted bilaterally.  No signs of infections or ulcers noted.     Onychomycosis  Pain in right toes  Pain in left toes  Consent was obtained for treatment procedures.   Mechanical debridement of nails 2-5  bilaterally performed with a nail nipper.  Filed with dremel without incident.    Return office visit    3 months.                 Told patient to return for periodic foot care and evaluation due to potential at risk complications.   Simmie Camerer DPM  

## 2020-08-20 ENCOUNTER — Other Ambulatory Visit (INDEPENDENT_AMBULATORY_CARE_PROVIDER_SITE_OTHER): Payer: Medicare Other

## 2020-08-20 ENCOUNTER — Other Ambulatory Visit: Payer: Self-pay

## 2020-08-20 DIAGNOSIS — E119 Type 2 diabetes mellitus without complications: Secondary | ICD-10-CM

## 2020-08-20 DIAGNOSIS — E7800 Pure hypercholesterolemia, unspecified: Secondary | ICD-10-CM

## 2020-08-20 LAB — COMPREHENSIVE METABOLIC PANEL
ALT: 16 U/L (ref 0–53)
AST: 16 U/L (ref 0–37)
Albumin: 4.3 g/dL (ref 3.5–5.2)
Alkaline Phosphatase: 95 U/L (ref 39–117)
BUN: 15 mg/dL (ref 6–23)
CO2: 31 mEq/L (ref 19–32)
Calcium: 9.9 mg/dL (ref 8.4–10.5)
Chloride: 102 mEq/L (ref 96–112)
Creatinine, Ser: 0.91 mg/dL (ref 0.40–1.50)
GFR: 82.22 mL/min (ref >60.00)
Glucose, Bld: 110 mg/dL — ABNORMAL HIGH (ref 70–99)
Potassium: 3.9 mEq/L (ref 3.5–5.1)
Sodium: 138 mEq/L (ref 135–145)
Total Bilirubin: 0.5 mg/dL (ref 0.2–1.2)
Total Protein: 7.9 g/dL (ref 6.0–8.3)

## 2020-08-20 LAB — LIPID PANEL
Cholesterol: 139 mg/dL (ref 0–200)
HDL: 37.2 mg/dL — ABNORMAL LOW (ref >39.00)
LDL Cholesterol: 71 mg/dL (ref 0–99)
NonHDL: 102.08
Total CHOL/HDL Ratio: 4
Triglycerides: 156 mg/dL — ABNORMAL HIGH (ref 0.0–149.0)
VLDL: 31.2 mg/dL (ref 0.0–40.0)

## 2020-08-20 LAB — MICROALBUMIN / CREATININE URINE RATIO
Creatinine,U: 96.5 mg/dL
Microalb Creat Ratio: 1.3 mg/g (ref 0.0–30.0)
Microalb, Ur: 1.2 mg/dL (ref 0.0–1.9)

## 2020-08-20 LAB — HEMOGLOBIN A1C: Hgb A1c MFr Bld: 6.4 % (ref 4.6–6.5)

## 2020-08-21 ENCOUNTER — Other Ambulatory Visit: Payer: 59

## 2020-08-23 ENCOUNTER — Other Ambulatory Visit: Payer: Self-pay

## 2020-08-23 ENCOUNTER — Ambulatory Visit: Payer: 59 | Admitting: Endocrinology

## 2020-08-23 ENCOUNTER — Ambulatory Visit (INDEPENDENT_AMBULATORY_CARE_PROVIDER_SITE_OTHER): Payer: Medicare Other | Admitting: Endocrinology

## 2020-08-23 VITALS — BP 125/75 | HR 82 | Ht 71.0 in | Wt 175.0 lb

## 2020-08-23 DIAGNOSIS — E7800 Pure hypercholesterolemia, unspecified: Secondary | ICD-10-CM

## 2020-08-23 DIAGNOSIS — E119 Type 2 diabetes mellitus without complications: Secondary | ICD-10-CM | POA: Diagnosis not present

## 2020-08-23 DIAGNOSIS — I10 Essential (primary) hypertension: Secondary | ICD-10-CM

## 2020-08-23 NOTE — Progress Notes (Signed)
Patient ID: Derek [name], male   DOB: [birth year], 75 y.o.   MRN: [id number]           Reason for Appointment: Endocrinology follow-up   History of Present Illness   Diagnosis: Type 2 DIABETES MELITUS, date of diagnosis: 2008      Past history: Although his glucose was 600 at diagnosis he was initially treated with metformin and subsequently had relatively milder diabetes Januvia was added to his regimen in 2011  Recent history:  Current non-insulin hypoglycemic drugs: Janumet 50/1000 once daily  A1c is stable at 6.4 compared to previous 6.3    He did not bring his monitor for download and states that he has left off checking his blood sugars  This is because he feels his sugars are usually the same in the 110-130 range  Previously would check sugars very infrequently also  Lab glucose is 110 after light breakfast  He is also still keeping his portions and calories down and eating healthy meals  Has lost another 5 pounds and now his BMI below 25  He does try to walk, weather permitting and up to 4 days a week  He takes the Janumet 50/1000 mg daily, no nausea with this  Side effects from medications: None       Monitors blood glucose:  less than once a day.    Glucometer:  FreeStyle            Blood Glucose readings by recall previously 110-130           Dietician visit: Most recent: Unknown           Wt Readings from Last 3 Encounters:  08/23/20 175 lb (79.4 kg)  11/03/19 180 lb (81.6 kg)  08/23/19 180 lb 9.6 oz (81.9 kg)   Lab Results  Component Value Date   HGBA1C 6.4 08/20/2020   HGBA1C 6.3 08/18/2019   HGBA1C 6.3 05/04/2018   Lab Results  Component Value Date   MICROALBUR 1.2 08/20/2020   LDLCALC 71 08/20/2020   CREATININE 0.91 08/20/2020    Lab on 08/20/2020  Component Date Value Ref Range Status  . Cholesterol 08/20/2020 139  0 - 200 mg/dL Final   ATP III Classification       Desirable:  < 200 mg/dL               Borderline High:  200 - 239 mg/dL           High:  > = 240 mg/dL  . Triglycerides 08/20/2020 156.0* 0.0 - 149.0 mg/dL Final   Normal:  <150 mg/dLBorderline High:  150 - 199 mg/dL  . HDL 08/20/2020 37.20* >39.00 mg/dL Final  . VLDL 08/20/2020 31.2  0.0 - 40.0 mg/dL Final  . LDL Cholesterol 08/20/2020 71  0 - 99 mg/dL Final  . Total CHOL/HDL Ratio 08/20/2020 4   Final                  Men          Women1/2 Average Risk     3.4          3.3Average Risk          5.0          4.42X Average Risk          9.6          7.13X Average Risk          15.0  11.0                      . NonHDL 08/20/2020 102.08   Final   NOTE:  Non-HDL goal should be 30 mg/dL higher than patient's LDL goal (i.e. LDL goal of < 70 mg/dL, would have non-HDL goal of < 100 mg/dL)  . Microalb, Ur 08/20/2020 1.2  0.0 - 1.9 mg/dL Final  . Creatinine,U 82/42/3536 96.5  mg/dL Final  . Microalb Creat Ratio 08/20/2020 1.3  0.0 - 30.0 mg/g Final  . Sodium 08/20/2020 138  135 - 145 mEq/L Final  . Potassium 08/20/2020 3.9  3.5 - 5.1 mEq/L Final  . Chloride 08/20/2020 102  96 - 112 mEq/L Final  . CO2 08/20/2020 31  19 - 32 mEq/L Final  . Glucose, Bld 08/20/2020 110* 70 - 99 mg/dL Final  . BUN 14/43/1540 15  6 - 23 mg/dL Final  . Creatinine, Ser 08/20/2020 0.91  0.40 - 1.50 mg/dL Final  . Total Bilirubin 08/20/2020 0.5  0.2 - 1.2 mg/dL Final  . Alkaline Phosphatase 08/20/2020 95  39 - 117 U/L Final  . AST 08/20/2020 16  0 - 37 U/L Final  . ALT 08/20/2020 16  0 - 53 U/L Final  . Total Protein 08/20/2020 7.9  6.0 - 8.3 g/dL Final  . Albumin 08/67/6195 4.3  3.5 - 5.2 g/dL Final  . GFR 09/32/6712 82.22  >60.00 mL/min Final   Calculated using the CKD-EPI Creatinine Equation (2021)  . Calcium 08/20/2020 9.9  8.4 - 10.5 mg/dL Final  . Hgb W5Y MFr Bld 08/20/2020 6.4  4.6 - 6.5 % Final   Glycemic Control Guidelines for People with Diabetes:Non Diabetic:  <6%Goal of Therapy: <7%Additional Action Suggested:  >8%     Allergies as of 08/23/2020      Reactions    Penicillins Rash      Medication List       Accurate as of August 23, 2020  1:36 PM. If you have any questions, ask your nurse or doctor.        Accu-Chek FastClix Lancets Misc Use Accu Chek fastclix to check blood sugar once daily. DX:E11.9   Accu-Chek Guide Me w/Device Kit 1 each by Does not apply route daily. Use to check blood sugar once daily. DX:E11.9   Accu-Chek Guide test strip Generic drug: glucose blood Use Accu Chek Guide test strips as instructed to check blood sugar once daily. DX:E11.9   aspirin 81 MG tablet Take 81 mg by mouth daily.   chlordiazePOXIDE 25 MG capsule Commonly known as: LIBRIUM Take 25 mg by mouth daily as needed.   esomeprazole 20 MG capsule Commonly known as: NEXIUM Take 20 mg by mouth 2 (two) times daily.   HYDROcodone-acetaminophen 5-325 MG tablet Commonly known as: NORCO/VICODIN Take 1 tablet by mouth every 6 (six) hours as needed for pain.   irbesartan-hydrochlorothiazide 150-12.5 MG tablet Commonly known as: AVALIDE Take 1 tablet by mouth daily at 12 noon.   Janumet 50-1000 MG tablet Generic drug: sitaGLIPtin-metformin TAKE 1 TABLET BY MOUTH EVERY DAY   Lumigan 0.01 % Soln Generic drug: bimatoprost   meclizine 25 MG tablet Commonly known as: ANTIVERT Take 25 mg by mouth as needed.   metoprolol succinate 25 MG 24 hr tablet Commonly known as: TOPROL-XL Take 1 tablet (25 mg total) by mouth daily.   potassium chloride 10 MEQ tablet Commonly known as: KLOR-CON Take 10 mEq by mouth daily.   sertraline 100 MG tablet  Commonly known as: ZOLOFT Take 100 mg by mouth daily.   sildenafil 100 MG tablet Commonly known as: VIAGRA Take 100 mg by mouth as needed for erectile dysfunction.   simvastatin 20 MG tablet Commonly known as: ZOCOR TAKE 1 TABLET BY MOUTH EVERYDAY AT BEDTIME   Symbicort 160-4.5 MCG/ACT inhaler Generic drug: budesonide-formoterol INHALE 2 PUFFS INTO THE LUNGS EVERY DAY   tamsulosin 0.4 MG Caps  capsule Commonly known as: FLOMAX Take 0.4 mg by mouth.   triamcinolone ointment 0.1 % Commonly known as: KENALOG Apply 1 application topically 2 (two) times daily.   Ventolin HFA 108 (90 Base) MCG/ACT inhaler Generic drug: albuterol   zolpidem 10 MG tablet Commonly known as: AMBIEN Take 10 mg by mouth at bedtime as needed for sleep.       Allergies:  Allergies  Allergen Reactions  . Penicillins Rash    Past Medical History:  Diagnosis Date  . Allergy   . Anxiety   . Arrhythmia   . Asthma   . Cataract    bilateral - just watching - no treatment yet  . Chronic headaches   . COPD (chronic obstructive pulmonary disease) ([place])   . COPD (chronic obstructive pulmonary disease) ([place])   . Depression   . Diabetes ([place])   . Diverticulosis   . GERD (gastroesophageal reflux disease)   . Glaucoma   . Hyperlipidemia   . Hypertension   . IBS (irritable bowel syndrome)   . Pneumonia   . Sleep apnea    does not use CPAP    Past Surgical History:  Procedure Laterality Date  . COLONOSCOPY    . ESOPHAGOGASTRODUODENOSCOPY ENDOSCOPY    . [place]  2010  . ROTATOR CUFF REPAIR  2008  . urology surgery      Family History  Problem Relation Age of Onset  . Arthritis Mother   . Hypertension Mother   . Heart failure Father   . Diabetes Other   . Colon cancer Neg Hx   . Colon polyps Neg Hx   . Stomach cancer Neg Hx   . Rectal cancer Neg Hx     Social History:  reports that he has never smoked. He has never used smokeless tobacco. He reports current alcohol use of about 2.0 standard drinks of alcohol per week. He reports that he does not use drugs.  Review of Systems:  HYPERTENSION:   well controlled with Avalide, also followed by PCP regularly On Toprol 25 mg from cardiologist  BP Readings from Last 3 Encounters:  08/23/20 125/75  11/03/19 134/82  08/23/19 126/72    HYPERLIPIDEMIA: The lipid abnormality consists of elevated LDL and low HDL treated by  his PCP with simvastatin $RemoveBeforeDE'40mg'ErXcSMcabMntEjj$  long-term LDL is as below, labs were nonfasting   Lab Results  Component Value Date   CHOL 139 08/20/2020   HDL 37.20 (L) 08/20/2020   LDLCALC 71 08/20/2020   TRIG 156.0 (H) 08/20/2020   CHOLHDL 4 08/20/2020    Diabetic foot exam 6/21, also followed by podiatrist Last eye exam 04/2020 without retinopathy    Examination:   BP 125/75   Pulse 82   Ht $R'5\' 11"'Zu$  (1.803 m)   Wt 175 lb (79.4 kg)   SpO2 95%   BMI 24.41 kg/m   Body mass index is 24.41 kg/m.    ASSESSMENT/ PLAN:   Diabetes type 2  See history of present illness for  discussion of current diabetes management, blood sugar patterns and problems identified  A1c is very stable at 6.3  He has been treated with Janumet 50/1000, 1 tablet daily  His blood sugars are consistently controlled for the last few years Likely has very mild diabetes but no evidence of progression He is able to exercise with walking fairly regularly Also has quite consistent with diet with which he continues to lose weight  Recommendations:  Restart checking blood sugars periodically, even once or twice a week is enough  He can increase his overall caloric intake without getting too many carbohydrates as BMI is relatively low  Regular walking  Stay on Janumet unchanged   Hyperlipidemia: Consistently controlled with at target LDL on simvastatin  Hypertension: Well controlled on Avalide Stable renal function and potassium Normal urine microalbumin  Advised him to take the booster for Covid vaccine and he is due for this  He will follow-up in 12 months unless he has any significant change in his blood sugars    Elayne Snare 08/23/2020, 1:36 PM

## 2020-08-23 NOTE — Patient Instructions (Signed)
Check blood sugars on waking up 1-2 days a week  Also check blood sugars about 2 hours after meals and do this after different meals by rotation  Recommended blood sugar levels on waking up are 90-130 and about 2 hours after meal is 130-160  Please bring your blood sugar monitor to each visit, thank you  Get Booster shot

## 2020-09-11 ENCOUNTER — Other Ambulatory Visit: Payer: 59

## 2020-09-13 ENCOUNTER — Ambulatory Visit: Payer: 59 | Admitting: Endocrinology

## 2020-10-24 ENCOUNTER — Other Ambulatory Visit: Payer: Self-pay

## 2020-10-24 ENCOUNTER — Encounter: Payer: Self-pay | Admitting: Podiatry

## 2020-10-24 ENCOUNTER — Ambulatory Visit (INDEPENDENT_AMBULATORY_CARE_PROVIDER_SITE_OTHER): Payer: Medicare Other | Admitting: Podiatry

## 2020-10-24 DIAGNOSIS — J309 Allergic rhinitis, unspecified: Secondary | ICD-10-CM | POA: Insufficient documentation

## 2020-10-24 DIAGNOSIS — B351 Tinea unguium: Secondary | ICD-10-CM | POA: Diagnosis not present

## 2020-10-24 DIAGNOSIS — M79675 Pain in left toe(s): Secondary | ICD-10-CM | POA: Diagnosis not present

## 2020-10-24 DIAGNOSIS — M1990 Unspecified osteoarthritis, unspecified site: Secondary | ICD-10-CM | POA: Insufficient documentation

## 2020-10-24 DIAGNOSIS — F411 Generalized anxiety disorder: Secondary | ICD-10-CM | POA: Insufficient documentation

## 2020-10-24 DIAGNOSIS — M79674 Pain in right toe(s): Secondary | ICD-10-CM

## 2020-10-24 DIAGNOSIS — E119 Type 2 diabetes mellitus without complications: Secondary | ICD-10-CM

## 2020-10-24 DIAGNOSIS — J449 Chronic obstructive pulmonary disease, unspecified: Secondary | ICD-10-CM | POA: Insufficient documentation

## 2020-10-24 DIAGNOSIS — R42 Dizziness and giddiness: Secondary | ICD-10-CM | POA: Insufficient documentation

## 2020-10-24 DIAGNOSIS — Z9189 Other specified personal risk factors, not elsewhere classified: Secondary | ICD-10-CM | POA: Insufficient documentation

## 2020-10-24 DIAGNOSIS — M205X9 Other deformities of toe(s) (acquired), unspecified foot: Secondary | ICD-10-CM | POA: Insufficient documentation

## 2020-10-24 DIAGNOSIS — E785 Hyperlipidemia, unspecified: Secondary | ICD-10-CM | POA: Insufficient documentation

## 2020-10-24 NOTE — Progress Notes (Signed)
This patient returns to my office for at risk foot care.  This patient requires this care by a professional since this patient will be at risk due to having diabetes.  This patient is unable to cut nails himself since the patient cannot reach his nails.These nails are painful walking and wearing shoes.  This patient presents for at risk foot care today.  General Appearance  Alert, conversant and in no acute stress.  Vascular  Dorsalis pedis and posterior tibial  pulses are palpable  bilaterally.  Capillary return is within normal limits  bilaterally. Temperature is within normal limits  bilaterally.  Neurologic  Senn-Weinstein monofilament wire test within normal limits  bilaterally. Muscle power within normal limits bilaterally.  Nails Thick disfigured discolored nails with subungual debris  from second  to fifth toes bilaterally. No evidence of bacterial infection or drainage bilaterally.  Orthopedic  No limitations of motion  feet .  No crepitus or effusions noted.  No bony pathology or digital deformities noted.  Skin  normotropic skin with no porokeratosis noted bilaterally.  No signs of infections or ulcers noted.     Onychomycosis  Pain in right toes  Pain in left toes  Consent was obtained for treatment procedures.   Mechanical debridement of nails 2-5  bilaterally performed with a nail nipper.  Filed with dremel without incident.    Return office visit    3 months.                 Told patient to return for periodic foot care and evaluation due to potential at risk complications.   Rucha Wissinger DPM  

## 2020-11-02 ENCOUNTER — Ambulatory Visit (INDEPENDENT_AMBULATORY_CARE_PROVIDER_SITE_OTHER): Payer: Medicare Other | Admitting: Cardiovascular Disease

## 2020-11-02 ENCOUNTER — Encounter: Payer: Self-pay | Admitting: Cardiovascular Disease

## 2020-11-02 ENCOUNTER — Other Ambulatory Visit: Payer: Self-pay

## 2020-11-02 VITALS — BP 122/64 | HR 81 | Ht 71.0 in | Wt 173.4 lb

## 2020-11-02 DIAGNOSIS — E119 Type 2 diabetes mellitus without complications: Secondary | ICD-10-CM | POA: Diagnosis not present

## 2020-11-02 DIAGNOSIS — I10 Essential (primary) hypertension: Secondary | ICD-10-CM | POA: Diagnosis not present

## 2020-11-02 DIAGNOSIS — E785 Hyperlipidemia, unspecified: Secondary | ICD-10-CM | POA: Diagnosis not present

## 2020-11-02 NOTE — Patient Instructions (Signed)

## 2020-11-02 NOTE — Progress Notes (Signed)
Cardiology Office Note   Date:  11/02/2020   ID:  LY BACCHI, DOB 12/26/1945, MRN [id number]  PCP:  Vincente Liberty, MD  Cardiologist:  Sanda Klein, MD  Electrophysiologist:  None   Evaluation Performed:  Follow-Up Visit  Chief Complaint:  HTN, HLP  History of Present Illness:    Derek [name] is a 75 y.o. male with type 2 diabetes mellitus, hypertension and dyslipidemia, despite healthy lifestyle and weight in optimal range.  The patient specifically denies any chest pain at rest exertion, dyspnea at rest or with exertion, orthopnea, paroxysmal nocturnal dyspnea, syncope, palpitations, focal neurological deficits, intermittent claudication, lower extremity edema, unexplained weight gain, cough, hemoptysis or wheezing.  He walks 2 miles a day 3-4 days a week. He is an [employer] and worked for the [place] [employer].  08/15/2020 labs show good glycemic control (A1c 6.4%) and LDL levels &1), but his HDL remains low (37). Borderline triglycerides 156. BP is well controlled , usually 120s/70s.    Past Medical History:  Diagnosis Date  . Allergy   . Anxiety   . Arrhythmia   . Asthma   . Cataract    bilateral - just watching - no treatment yet  . Chronic headaches   . COPD (chronic obstructive pulmonary disease) ([place])   . COPD (chronic obstructive pulmonary disease) ([place])   . Depression   . Diabetes ([place])   . Diverticulosis   . GERD (gastroesophageal reflux disease)   . Glaucoma   . Hyperlipidemia   . Hypertension   . IBS (irritable bowel syndrome)   . Pneumonia   . Sleep apnea    does not use CPAP   Past Surgical History:  Procedure Laterality Date  . COLONOSCOPY    . ESOPHAGOGASTRODUODENOSCOPY ENDOSCOPY    . [facility]  2010  . ROTATOR CUFF REPAIR  2008  . urology surgery       Current Meds  Medication Sig  . Accu-Chek FastClix Lancets MISC Use Accu Chek fastclix to check blood sugar once daily. DX:E11.9  . aspirin 81 MG tablet Take 81  mg by mouth daily.  . Blood Glucose Monitoring Suppl (ACCU-CHEK GUIDE ME) w/Device KIT 1 each by Does not apply route daily. Use to check blood sugar once daily. DX:E11.9  . chlordiazePOXIDE (LIBRIUM) 25 MG capsule Take 25 mg by mouth daily as needed.   Marland Kitchen esomeprazole (NEXIUM) 20 MG capsule Take 20 mg by mouth 2 (two) times daily.  Marland Kitchen glucose blood (ACCU-CHEK GUIDE) test strip Use Accu Chek Guide test strips as instructed to check blood sugar once daily. DX:E11.9  . HYDROcodone-acetaminophen (NORCO/VICODIN) 5-325 MG per tablet Take 1 tablet by mouth every 6 (six) hours as needed for pain.  Marland Kitchen irbesartan-hydrochlorothiazide (AVALIDE) 150-12.5 MG per tablet Take 1 tablet by mouth daily at 12 noon.   Marland Kitchen JANUMET 50-1000 MG tablet TAKE 1 TABLET BY MOUTH EVERY DAY  . LUMIGAN 0.01 % SOLN   . meclizine (ANTIVERT) 25 MG tablet Take 25 mg by mouth as needed.   . metoprolol succinate (TOPROL-XL) 25 MG 24 hr tablet Take 1 tablet (25 mg total) by mouth daily.  . metoprolol tartrate (LOPRESSOR) 25 MG tablet Take 25 mg by mouth daily.  Marland Kitchen moxifloxacin (VIGAMOX) 0.5 % ophthalmic solution Apply to eye.  . potassium chloride (K-DUR,KLOR-CON) 10 MEQ tablet Take 10 mEq by mouth daily.   . prednisoLONE acetate (PRED FORTE) 1 % ophthalmic suspension SMARTSIG:In Eye(s)  . PROLENSA 0.07 % SOLN  Apply to eye.  . sertraline (ZOLOFT) 100 MG tablet Take 100 mg by mouth daily.  . sildenafil (VIAGRA) 100 MG tablet Take 100 mg by mouth as needed for erectile dysfunction.  . simvastatin (ZOCOR) 20 MG tablet TAKE 1 TABLET BY MOUTH EVERYDAY AT BEDTIME  . SYMBICORT 160-4.5 MCG/ACT inhaler INHALE 2 PUFFS INTO THE LUNGS EVERY DAY  . tamsulosin (FLOMAX) 0.4 MG CAPS capsule Take 0.4 mg by mouth.  . triamcinolone ointment (KENALOG) 0.1 % Apply 1 application topically 2 (two) times daily.  . VENTOLIN HFA 108 (90 BASE) MCG/ACT inhaler   . zolpidem (AMBIEN) 10 MG tablet Take 10 mg by mouth at bedtime as needed for sleep.     Allergies:    Penicillins   Social History   Tobacco Use  . Smoking status: Never Smoker  . Smokeless tobacco: Never Used  Vaping Use  . Vaping Use: Never used  Substance Use Topics  . Alcohol use: Yes    Alcohol/week: 2.0 standard drinks    Types: 2 Shots of liquor per week  . Drug use: No     Family Hx: The patient's family history includes Arthritis in his mother; Diabetes in an other family member; Heart failure in his father; Hypertension in his mother. There is no history of Colon cancer, Colon polyps, Stomach cancer, or Rectal cancer.  ROS:   Please see the history of present illness.     All other systems reviewed and are negative.   Prior CV studies:   The following studies were reviewed today: Labs from Dr. Dwyane Dee  Labs/Other Tests and Data Reviewed:    EKG: shows NSR, normal tracing Recent Labs: 08/20/2020: ALT 16; BUN 15; Creatinine, Ser 0.91; Potassium 3.9; Sodium 138   Recent Lipid Panel Lab Results  Component Value Date/Time   CHOL 139 08/20/2020 02:23 PM   TRIG 156.0 (H) 08/20/2020 02:23 PM   HDL 37.20 (L) 08/20/2020 02:23 PM   CHOLHDL 4 08/20/2020 02:23 PM   LDLCALC 71 08/20/2020 02:23 PM    Wt Readings from Last 3 Encounters:  11/02/20 173 lb 6.4 oz (78.7 kg)  08/23/20 175 lb (79.4 kg)  11/03/19 180 lb (81.6 kg)     Objective:    Vital Signs:  BP 140/71   Pulse 81   Ht _0  (1.803 m)   Wt 173 lb 6.4 oz (78.7 kg)   SpO2 97%   BMI 24.18 kg/m     General: Alert, oriented x3, no distress, appears fit Head: no evidence of trauma, PERRL, EOMI, no exophtalmos or lid lag, no myxedema, no xanthelasma; normal ears, nose and oropharynx Neck: normal jugular venous pulsations and no hepatojugular reflux; brisk carotid pulses without delay and no carotid bruits Chest: clear to auscultation, no signs of consolidation by percussion or palpation, normal fremitus, symmetrical and full respiratory excursions Cardiovascular: normal position and quality of the apical  impulse, regular rhythm, normal first and second heart sounds, no murmurs, rubs or gallops Abdomen: no tenderness or distention, no masses by palpation, no abnormal pulsatility or arterial bruits, normal bowel sounds, no hepatosplenomegaly Extremities: no clubbing, cyanosis or edema; 2+ radial, ulnar and brachial pulses bilaterally; 2+ right femoral, posterior tibial and dorsalis pedis pulses; 2+ left femoral, posterior tibial and dorsalis pedis pulses; no subclavian or femoral bruits Neurological: grossly nonfocal Psych: Normal mood and affect   ASSESSMENT & PLAN:    1. HTN: good control or ARB/beta blocker/diuretic. 2. DM: A1c in target range. 3. HLP: Good LDL on  statin. Despite being lean and walking 2.5-3 hours a week, HDL is still low and TG borderline high. Try adding another hour of weekly exercise. Avoid carbs with high glycemic index.   Patient Instructions  Medication Instructions:  No changes *If you need a refill on your cardiac medications before your next appointment, please call your pharmacy*   Lab Work: None ordered If you have labs (blood work) drawn today and your tests are completely normal, you will receive your results only by: Marland Kitchen MyChart Message (if you have MyChart) OR . A paper copy in the mail If you have any lab test that is abnormal or we need to change your treatment, we will call you to review the results.   Testing/Procedures: None ordered   Follow-Up: At [facility], you and your health needs are our priority.  As part of our continuing mission to provide you with exceptional heart care, we have created designated Provider Care Teams.  These Care Teams include your primary Cardiologist (physician) and Advanced Practice Providers (APPs -  Physician Assistants and Nurse Practitioners) who all work together to provide you with the care you need, when you need it.  We recommend signing up for the patient portal called "MyChart".  Sign up information is  provided on this After Visit Summary.  MyChart is used to connect with patients for Virtual Visits (Telemedicine).  Patients are able to view lab/test results, encounter notes, upcoming appointments, etc.  Non-urgent messages can be sent to your provider as well.   To learn more about what you can do with MyChart, go to [email or web address].    Your next appointment:   12 month(s)  The format for your next appointment:   In Person  Provider:   You may see Sanda Klein, MD or one of the following Advanced Practice Providers on your designated Care Team:    Almyra Deforest, PA-C  Fabian Sharp, PA-C or   Roby Lofts, [place]       Tests Ordered: No orders of the defined types were placed in this encounter.   Medication Changes: No orders of the defined types were placed in this encounter.   Follow Up:  In Person 1 year  Signed, Sanda Klein, MD  11/02/2020 3:31 PM    [place]

## 2021-01-13 ENCOUNTER — Other Ambulatory Visit: Payer: Self-pay | Admitting: Endocrinology

## 2021-01-23 ENCOUNTER — Ambulatory Visit (INDEPENDENT_AMBULATORY_CARE_PROVIDER_SITE_OTHER): Payer: Medicare Other | Admitting: Podiatry

## 2021-01-23 ENCOUNTER — Other Ambulatory Visit: Payer: Self-pay

## 2021-01-23 ENCOUNTER — Encounter: Payer: Self-pay | Admitting: Podiatry

## 2021-01-23 DIAGNOSIS — E119 Type 2 diabetes mellitus without complications: Secondary | ICD-10-CM | POA: Diagnosis not present

## 2021-01-23 DIAGNOSIS — B351 Tinea unguium: Secondary | ICD-10-CM

## 2021-01-23 DIAGNOSIS — M79674 Pain in right toe(s): Secondary | ICD-10-CM | POA: Diagnosis not present

## 2021-01-23 DIAGNOSIS — M79675 Pain in left toe(s): Secondary | ICD-10-CM | POA: Diagnosis not present

## 2021-01-23 NOTE — Progress Notes (Signed)
This patient returns to my office for at risk foot care.  This patient requires this care by a professional since this patient will be at risk due to having diabetes.  This patient is unable to cut nails himself since the patient cannot reach his nails.These nails are painful walking and wearing shoes.  This patient presents for at risk foot care today.  General Appearance  Alert, conversant and in no acute stress.  Vascular  Dorsalis pedis and posterior tibial  pulses are palpable  bilaterally.  Capillary return is within normal limits  bilaterally. Temperature is within normal limits  bilaterally.  Neurologic  Senn-Weinstein monofilament wire test within normal limits  bilaterally. Muscle power within normal limits bilaterally.  Nails Thick disfigured discolored nails with subungual debris  from second  to fifth toes bilaterally. No evidence of bacterial infection or drainage bilaterally.  Orthopedic  No limitations of motion  feet .  No crepitus or effusions noted.  No bony pathology or digital deformities noted.  Skin  normotropic skin with no porokeratosis noted bilaterally.  No signs of infections or ulcers noted.     Onychomycosis  Pain in right toes  Pain in left toes  Consent was obtained for treatment procedures.   Mechanical debridement of nails 2-5  bilaterally performed with a nail nipper.  Filed with dremel without incident.    Return office visit    3 months.                 Told patient to return for periodic foot care and evaluation due to potential at risk complications.   Verneal Wiers DPM  

## 2021-02-28 ENCOUNTER — Encounter: Payer: Self-pay | Admitting: Podiatrist

## 2021-02-28 ENCOUNTER — Ambulatory Visit (INDEPENDENT_AMBULATORY_CARE_PROVIDER_SITE_OTHER): Payer: Medicare Other | Admitting: Podiatrist

## 2021-02-28 ENCOUNTER — Other Ambulatory Visit: Payer: Self-pay

## 2021-02-28 ENCOUNTER — Ambulatory Visit (INDEPENDENT_AMBULATORY_CARE_PROVIDER_SITE_OTHER): Payer: Medicare Other

## 2021-02-28 DIAGNOSIS — M79671 Pain in right foot: Secondary | ICD-10-CM

## 2021-02-28 DIAGNOSIS — S9031XA Contusion of right foot, initial encounter: Secondary | ICD-10-CM

## 2021-02-28 DIAGNOSIS — S91311A Laceration without foreign body, right foot, initial encounter: Secondary | ICD-10-CM

## 2021-02-28 NOTE — Progress Notes (Signed)
Chief Complaint  Patient presents with   lesion    Rt bottom heel lesion x Friday - no injury -no opening  no redness/swelling/drainage - w/ stining - 4/10 soreness tx: neosporin      HPI: Patient is 75 y.o. male who presents today for a concerning lesion on the bottom of the right heel. He noticed the lesion on Friday and relates it is somewhat painful  denies any trauma or injury he can recall.    Patient Active Problem List   Diagnosis Date Noted   Anxiety state 10/24/2020   Allergic rhinitis 10/24/2020   Chronic obstructive lung disease (HCC) 10/24/2020   Dizziness and giddiness 10/24/2020   Osteoarthrosis 10/24/2020   Other acquired deformity of toe 10/24/2020   Other and unspecified hyperlipidemia 10/24/2020   Other personal history presenting hazards to health 10/24/2020   Pain due to onychomycosis of toenails of both feet 01/04/2019   Leaky heart valve 05/12/2014   GERD (gastroesophageal reflux disease) 10/11/2013   Belching 10/11/2013   Essential hypertension 03/16/2013   Dyslipidemia (high LDL; low HDL) 03/16/2013   Controlled type 2 diabetes mellitus without complication, without long-term current use of insulin (HCC) 03/07/2013    Current Outpatient Medications on File Prior to Visit  Medication Sig Dispense Refill   Accu-Chek FastClix Lancets MISC Use Accu Chek fastclix to check blood sugar once daily. DX:E11.9 100 each 2   aspirin 81 MG tablet Take 81 mg by mouth daily.     Blood Glucose Monitoring Suppl (ACCU-CHEK GUIDE ME) w/Device KIT 1 each by Does not apply route daily. Use to check blood sugar once daily. DX:E11.9 1 kit 0   chlordiazePOXIDE (LIBRIUM) 25 MG capsule Take 25 mg by mouth daily as needed.   1   esomeprazole (NEXIUM) 20 MG capsule Take 20 mg by mouth 2 (two) times daily.  4   glucose blood (ACCU-CHEK GUIDE) test strip Use Accu Chek Guide test strips as instructed to check blood sugar once daily. DX:E11.9 100 each 2   HYDROcodone-acetaminophen  (NORCO/VICODIN) 5-325 MG per tablet Take 1 tablet by mouth every 6 (six) hours as needed for pain.     irbesartan-hydrochlorothiazide (AVALIDE) 150-12.5 MG per tablet Take 1 tablet by mouth daily at 12 noon.      JANUMET 50-1000 MG tablet TAKE 1 TABLET BY MOUTH EVERY DAY 90 tablet 1   LUMIGAN 0.01 % SOLN   11   meclizine (ANTIVERT) 25 MG tablet Take 25 mg by mouth as needed.   4   metoprolol succinate (TOPROL-XL) 25 MG 24 hr tablet Take 1 tablet (25 mg total) by mouth daily. 90 tablet 3   moxifloxacin (VIGAMOX) 0.5 % ophthalmic solution Apply to eye.     potassium chloride (K-DUR,KLOR-CON) 10 MEQ tablet Take 10 mEq by mouth daily.      prednisoLONE acetate (PRED FORTE) 1 % ophthalmic suspension SMARTSIG:In Eye(s)     PROLENSA 0.07 % SOLN Apply to eye.     sertraline (ZOLOFT) 100 MG tablet Take 100 mg by mouth daily.     sildenafil (VIAGRA) 100 MG tablet Take 100 mg by mouth as needed for erectile dysfunction.     simvastatin (ZOCOR) 20 MG tablet TAKE 1 TABLET BY MOUTH EVERYDAY AT BEDTIME  3   SYMBICORT 160-4.5 MCG/ACT inhaler INHALE 2 PUFFS INTO THE LUNGS EVERY DAY  4   tamsulosin (FLOMAX) 0.4 MG CAPS capsule Take 0.4 mg by mouth.     triamcinolone ointment (KENALOG) 0.1 % Apply 1   application topically 2 (two) times daily. 30 g 0   VENTOLIN HFA 108 (90 BASE) MCG/ACT inhaler   4   zolpidem (AMBIEN) 10 MG tablet Take 10 mg by mouth at bedtime as needed for sleep.     No current facility-administered medications on file prior to visit.    Allergies  Allergen Reactions   Penicillins Rash    Review of Systems No fevers, chills, nausea, muscle aches, no difficulty breathing, no calf pain, no chest pain or shortness of breath.   Physical Exam  GENERAL APPEARANCE: Alert, conversant. Appropriately groomed. No acute distress.   VASCULAR: Pedal pulses palpable DP and PT bilateral.  Capillary refill time is immediate to all digits,  Proximal to distal cooling it warm to warm.  Digital  perfusion adequate.   NEUROLOGIC: sensation is intact to 5.07 monofilament at 5/5 sites bilateral.  Light touch is intact bilateral, vibratory sensation intact bilateral  MUSCULOSKELETAL: acceptable muscle strength, tone and stability bilateral.  No gross boney pedal deformities noted.  Generalized pain with pressure of the heel is noted with palpation plantarly  DERMATOLOGIC: skin is warm, supple, and dry.  No open lesions noted.  No rash, no pre ulcerative lesions. Digital nails are thickened discolored, and mycotic but asymptomatic at todays visit.   Right heels has a small semicircular skin tear on the plantar central aspect.  A small flap of skin tear is hanging and appears to be catching on his sock or shoe and causing discomfort.    Xrays are negative for foreign body. No acute osseous abnormalities are seen.   Assessment     ICD-10-CM   1. Contusion of right foot, initial encounter  S90.31XA     2. Right foot pain  M79.671 DG Foot Complete Right    3. Tear of skin of right heel, initial encounter  S91.311A        Plan  Exam and xray findings discussed with the patient.  Discussed there are no foreign bodies seen on xray and discussed he has a small skin tear on his heel which is causing his pain.  I removed the free piece of skin that was attached with a 15 blade and applied antibiotic ointment and a bandaid.  Encouraged him to keep the area clean and continue applying antibiotic ointment and a bandaid daily for the next week or two.  If any increase in pain, redness, swelling occur or if he notices any drainage he will call.  Otherwise will be seen back for his routine care appointments.

## 2021-02-28 NOTE — Patient Instructions (Signed)
Keep the back of the right foot/ heel clean with either epsom salt soaks or soap and water.  Dry well.  Apply antibiotic ointment to the area and cover (if you can) ok to leave uncovered with clean socks.

## 2021-03-04 ENCOUNTER — Other Ambulatory Visit (HOSPITAL_BASED_OUTPATIENT_CLINIC_OR_DEPARTMENT_OTHER): Payer: Self-pay

## 2021-03-04 ENCOUNTER — Ambulatory Visit: Payer: Medicare Other | Attending: Internal Medicine

## 2021-03-04 DIAGNOSIS — Z23 Encounter for immunization: Secondary | ICD-10-CM

## 2021-03-04 MED ORDER — PFIZER-BIONT COVID-19 VAC-TRIS 30 MCG/0.3ML IM SUSP
INTRAMUSCULAR | 0 refills | Status: DC
  Filled 2021-03-04: qty 0.3, 1d supply, fill #0

## 2021-03-04 NOTE — Progress Notes (Signed)
   Covid-19 Vaccination Clinic  Name:  PARSA RICKETT    MRN: [id number] DOB: Sep 05, 1945  03/04/2021  Mr. Ohern was observed post Covid-19 immunization for 15 minutes without incident. He was provided with Vaccine Information Sheet and instruction to access the V-Safe system.   Mr. Ksiazek was instructed to call 911 with any severe reactions post vaccine: Difficulty breathing  Swelling of face and throat  A fast heartbeat  A bad rash all over body  Dizziness and weakness   Immunizations Administered     Name Date Dose VIS Date Route   PFIZER Comrnaty(Gray TOP) Covid-19 Vaccine 03/04/2021 12:57 PM 0.3 mL 06/21/2020 Intramuscular   Manufacturer: [employer], [employer]   Lot: [id number]   NDC: [phone]

## 2021-03-13 ENCOUNTER — Other Ambulatory Visit: Payer: Self-pay | Admitting: Podiatrist

## 2021-03-13 DIAGNOSIS — S9031XA Contusion of right foot, initial encounter: Secondary | ICD-10-CM

## 2021-05-01 ENCOUNTER — Other Ambulatory Visit: Payer: Self-pay

## 2021-05-01 ENCOUNTER — Encounter: Payer: Self-pay | Admitting: Podiatry

## 2021-05-01 ENCOUNTER — Ambulatory Visit (INDEPENDENT_AMBULATORY_CARE_PROVIDER_SITE_OTHER): Payer: Medicare Other | Admitting: Podiatry

## 2021-05-01 DIAGNOSIS — M79675 Pain in left toe(s): Secondary | ICD-10-CM

## 2021-05-01 DIAGNOSIS — B351 Tinea unguium: Secondary | ICD-10-CM

## 2021-05-01 DIAGNOSIS — E119 Type 2 diabetes mellitus without complications: Secondary | ICD-10-CM

## 2021-05-01 DIAGNOSIS — M79674 Pain in right toe(s): Secondary | ICD-10-CM | POA: Diagnosis not present

## 2021-05-01 NOTE — Progress Notes (Signed)
This patient returns to my office for at risk foot care.  This patient requires this care by a professional since this patient will be at risk due to having diabetes.  This patient is unable to cut nails himself since the patient cannot reach his nails.These nails are painful walking and wearing shoes.  This patient presents for at risk foot care today.  General Appearance  Alert, conversant and in no acute stress.  Vascular  Dorsalis pedis and posterior tibial  pulses are palpable  bilaterally.  Capillary return is within normal limits  bilaterally. Temperature is within normal limits  bilaterally.  Neurologic  Senn-Weinstein monofilament wire test within normal limits  bilaterally. Muscle power within normal limits bilaterally.  Nails Thick disfigured discolored nails with subungual debris  from second  to fifth toes bilaterally. No evidence of bacterial infection or drainage bilaterally.  Orthopedic  No limitations of motion  feet .  No crepitus or effusions noted.  No bony pathology or digital deformities noted.  Skin  normotropic skin with no porokeratosis noted bilaterally.  No signs of infections or ulcers noted.     Onychomycosis  Pain in right toes  Pain in left toes  Consent was obtained for treatment procedures.   Mechanical debridement of nails 2-5  bilaterally performed with a nail nipper.  Filed with dremel without incident.    Return office visit    3 months.                 Told patient to return for periodic foot care and evaluation due to potential at risk complications.   Keenan Dimitrov DPM  

## 2021-07-10 ENCOUNTER — Encounter: Payer: Self-pay | Admitting: Podiatry

## 2021-07-10 ENCOUNTER — Ambulatory Visit (INDEPENDENT_AMBULATORY_CARE_PROVIDER_SITE_OTHER): Payer: Medicare Other | Admitting: Podiatry

## 2021-07-10 ENCOUNTER — Other Ambulatory Visit: Payer: Self-pay

## 2021-07-10 DIAGNOSIS — M79674 Pain in right toe(s): Secondary | ICD-10-CM | POA: Diagnosis not present

## 2021-07-10 DIAGNOSIS — B351 Tinea unguium: Secondary | ICD-10-CM

## 2021-07-10 DIAGNOSIS — M79675 Pain in left toe(s): Secondary | ICD-10-CM | POA: Diagnosis not present

## 2021-07-10 DIAGNOSIS — E119 Type 2 diabetes mellitus without complications: Secondary | ICD-10-CM | POA: Diagnosis not present

## 2021-07-10 NOTE — Progress Notes (Signed)
This patient returns to my office for at risk foot care.  This patient requires this care by a professional since this patient will be at risk due to having diabetes.  This patient is unable to cut nails himself since the patient cannot reach his nails.These nails are painful walking and wearing shoes.  This patient presents for at risk foot care today.  General Appearance  Alert, conversant and in no acute stress.  Vascular  Dorsalis pedis and posterior tibial  pulses are palpable  bilaterally.  Capillary return is within normal limits  bilaterally. Temperature is within normal limits  bilaterally.  Neurologic  Senn-Weinstein monofilament wire test within normal limits  bilaterally. Muscle power within normal limits bilaterally.  Nails Thick disfigured discolored nails with subungual debris  from second  to fifth toes bilaterally. No evidence of bacterial infection or drainage bilaterally.  Orthopedic  No limitations of motion  feet .  No crepitus or effusions noted.  No bony pathology or digital deformities noted.  Skin  normotropic skin with no porokeratosis noted bilaterally.  No signs of infections or ulcers noted.     Onychomycosis  Pain in right toes  Pain in left toes  Consent was obtained for treatment procedures.   Mechanical debridement of nails 2-5  bilaterally performed with a nail nipper.  Filed with dremel without incident.    Return office visit    3 months.                 Told patient to return for periodic foot care and evaluation due to potential at risk complications.   Altha Sweitzer DPM  

## 2021-08-15 ENCOUNTER — Other Ambulatory Visit: Payer: Self-pay

## 2021-08-15 ENCOUNTER — Other Ambulatory Visit (INDEPENDENT_AMBULATORY_CARE_PROVIDER_SITE_OTHER): Payer: Medicare Other

## 2021-08-15 ENCOUNTER — Other Ambulatory Visit: Payer: Self-pay | Admitting: Endocrinology

## 2021-08-15 DIAGNOSIS — E119 Type 2 diabetes mellitus without complications: Secondary | ICD-10-CM

## 2021-08-15 DIAGNOSIS — E7800 Pure hypercholesterolemia, unspecified: Secondary | ICD-10-CM | POA: Diagnosis not present

## 2021-08-15 LAB — COMPREHENSIVE METABOLIC PANEL
ALT: 11 U/L (ref 0–53)
AST: 13 U/L (ref 0–37)
Albumin: 4.3 g/dL (ref 3.5–5.2)
Alkaline Phosphatase: 90 U/L (ref 39–117)
BUN: 15 mg/dL (ref 6–23)
CO2: 32 mEq/L (ref 19–32)
Calcium: 9.8 mg/dL (ref 8.4–10.5)
Chloride: 102 mEq/L (ref 96–112)
Creatinine, Ser: 0.94 mg/dL (ref 0.40–1.50)
GFR: 78.53 mL/min (ref >60.00)
Glucose, Bld: 86 mg/dL (ref 70–99)
Potassium: 4.3 mEq/L (ref 3.5–5.1)
Sodium: 140 mEq/L (ref 135–145)
Total Bilirubin: 0.4 mg/dL (ref 0.2–1.2)
Total Protein: 7.5 g/dL (ref 6.0–8.3)

## 2021-08-15 LAB — LIPID PANEL
Cholesterol: 130 mg/dL (ref 0–200)
HDL: 44.9 mg/dL (ref >39.00)
LDL Cholesterol: 69 mg/dL (ref 0–99)
NonHDL: 85.13
Total CHOL/HDL Ratio: 3
Triglycerides: 82 mg/dL (ref 0.0–149.0)
VLDL: 16.4 mg/dL (ref 0.0–40.0)

## 2021-08-15 LAB — HEMOGLOBIN A1C: Hgb A1c MFr Bld: 6.5 % (ref 4.6–6.5)

## 2021-08-16 LAB — MICROALBUMIN / CREATININE URINE RATIO
Creatinine,U: 95 mg/dL
Microalb Creat Ratio: 1.2 mg/g (ref 0.0–30.0)
Microalb, Ur: 1.2 mg/dL (ref 0.0–1.9)

## 2021-08-22 ENCOUNTER — Other Ambulatory Visit: Payer: Self-pay

## 2021-08-22 ENCOUNTER — Encounter: Payer: Self-pay | Admitting: Endocrinology

## 2021-08-22 ENCOUNTER — Ambulatory Visit (INDEPENDENT_AMBULATORY_CARE_PROVIDER_SITE_OTHER): Payer: Medicare Other | Admitting: Endocrinology

## 2021-08-22 VITALS — BP 118/74 | HR 94 | Ht 70.0 in | Wt 175.6 lb

## 2021-08-22 DIAGNOSIS — I10 Essential (primary) hypertension: Secondary | ICD-10-CM | POA: Diagnosis not present

## 2021-08-22 DIAGNOSIS — E7800 Pure hypercholesterolemia, unspecified: Secondary | ICD-10-CM | POA: Diagnosis not present

## 2021-08-22 DIAGNOSIS — E119 Type 2 diabetes mellitus without complications: Secondary | ICD-10-CM

## 2021-08-22 NOTE — Progress Notes (Signed)
Patient ID: Derek [name], male   DOB: October 27, 1945, 76 y.o.   MRN: [id number]           Reason for Appointment: Endocrinology follow-up   History of Present Illness   Diagnosis: Type 2 DIABETES MELITUS, date of diagnosis: 2008      Past history: Although his glucose was 600 at diagnosis he was initially treated with metformin and subsequently had relatively milder diabetes Januvia was added to his regimen in 2011  Recent history:  Current non-insulin hypoglycemic drugs: Janumet 50/1000 once daily  A1c is stable at 6.5  He did not bring his monitor for download again He has not checked his sugars in a couple of months Again says that since his sugars do not fluctuate but he is not inclined to check them Lab glucose is 86 fasting  He feels that he is still eating very healthy meals A few weeks ago when he had intercurrent medical problems with abdominal issues he was not able to eat much and had lost some more weight but this is back to baseline now He does try to walk 1 mile weather permitting and up to 3-4 days a week He takes the Janumet 50/1000 mg daily regularly  Side effects from medications: None       Monitors blood glucose:  less than once a day.    Glucometer:  FreeStyle            Blood Glucose readings            Dietician visit: Most recent: Unknown           Wt Readings from Last 3 Encounters:  08/22/21 175 lb 9.6 oz (79.7 kg)  11/02/20 173 lb 6.4 oz (78.7 kg)  08/23/20 175 lb (79.4 kg)   Lab Results  Component Value Date   HGBA1C 6.5 08/15/2021   HGBA1C 6.4 08/20/2020   HGBA1C 6.3 08/18/2019   Lab Results  Component Value Date   MICROALBUR 1.2 08/15/2021   LDLCALC 69 08/15/2021   CREATININE 0.94 08/15/2021    No visits with results within 1 Week(s) from this visit.  Latest known visit with results is:  Lab on 08/15/2021  Component Date Value Ref Range Status   Cholesterol 08/15/2021 130  0 - 200 mg/dL Final   ATP III Classification        Desirable:  < 200 mg/dL               Borderline High:  200 - 239 mg/dL          High:  > = 240 mg/dL   Triglycerides 08/15/2021 82.0  0.0 - 149.0 mg/dL Final   Normal:  <150 mg/dLBorderline High:  150 - 199 mg/dL   HDL 08/15/2021 44.90  >39.00 mg/dL Final   VLDL 08/15/2021 16.4  0.0 - 40.0 mg/dL Final   LDL Cholesterol 08/15/2021 69  0 - 99 mg/dL Final   Total CHOL/HDL Ratio 08/15/2021 3   Final                  Men          Women1/2 Average Risk     3.4          3.3Average Risk          5.0          4.42X Average Risk          9.6          7.13X  Average Risk          15.0          11.0                       NonHDL 08/15/2021 85.13   Final   NOTE:  Non-HDL goal should be 30 mg/dL higher than patient's LDL goal (i.e. LDL goal of < 70 mg/dL, would have non-HDL goal of < 100 mg/dL)   Microalb, Ur 08/15/2021 1.2  0.0 - 1.9 mg/dL Final   Creatinine,U 08/15/2021 95.0  mg/dL Final   Microalb Creat Ratio 08/15/2021 1.2  0.0 - 30.0 mg/g Final   Sodium 08/15/2021 140  135 - 145 mEq/L Final   Potassium 08/15/2021 4.3  3.5 - 5.1 mEq/L Final   Chloride 08/15/2021 102  96 - 112 mEq/L Final   CO2 08/15/2021 32  19 - 32 mEq/L Final   Glucose, Bld 08/15/2021 86  70 - 99 mg/dL Final   BUN 08/15/2021 15  6 - 23 mg/dL Final   Creatinine, Ser 08/15/2021 0.94  0.40 - 1.50 mg/dL Final   Total Bilirubin 08/15/2021 0.4  0.2 - 1.2 mg/dL Final   Alkaline Phosphatase 08/15/2021 90  39 - 117 U/L Final   AST 08/15/2021 13  0 - 37 U/L Final   ALT 08/15/2021 11  0 - 53 U/L Final   Total Protein 08/15/2021 7.5  6.0 - 8.3 g/dL Final   Albumin 08/15/2021 4.3  3.5 - 5.2 g/dL Final   GFR 08/15/2021 78.53  >60.00 mL/min Final   Calculated using the CKD-EPI Creatinine Equation (2021)   Calcium 08/15/2021 9.8  8.4 - 10.5 mg/dL Final   Hgb A1c MFr Bld 08/15/2021 6.5  4.6 - 6.5 % Final   Glycemic Control Guidelines for People with Diabetes:Non Diabetic:  <6%Goal of Therapy: <7%Additional Action Suggested:  >8%     Allergies  as of 08/22/2021       Reactions   Penicillins Rash        Medication List        Accurate as of August 22, 2021  3:14 PM. If you have any questions, ask your nurse or doctor.          Accu-Chek FastClix Lancets Misc Use Accu Chek fastclix to check blood sugar once daily. DX:E11.9   Accu-Chek Guide Me w/Device Kit 1 each by Does not apply route daily. Use to check blood sugar once daily. DX:E11.9   Accu-Chek Guide test strip Generic drug: glucose blood Use Accu Chek Guide test strips as instructed to check blood sugar once daily. DX:E11.9   aspirin 81 MG tablet Take 81 mg by mouth daily.   chlordiazePOXIDE 25 MG capsule Commonly known as: LIBRIUM Take 25 mg by mouth daily as needed.   esomeprazole 20 MG capsule Commonly known as: NEXIUM Take 20 mg by mouth 2 (two) times daily.   HYDROcodone-acetaminophen 5-325 MG tablet Commonly known as: NORCO/VICODIN Take 1 tablet by mouth every 6 (six) hours as needed for pain.   irbesartan-hydrochlorothiazide 150-12.5 MG tablet Commonly known as: AVALIDE Take 1 tablet by mouth daily at 12 noon.   Janumet 50-1000 MG tablet Generic drug: sitaGLIPtin-metformin TAKE 1 TABLET BY MOUTH EVERY DAY   Lumigan 0.01 % Soln Generic drug: bimatoprost   meclizine 25 MG tablet Commonly known as: ANTIVERT Take 25 mg by mouth as needed.   metoprolol succinate 25 MG 24 hr tablet Commonly known as: TOPROL-XL Take 1 tablet (25  mg total) by mouth daily.   moxifloxacin 0.5 % ophthalmic solution Commonly known as: VIGAMOX Apply to eye.   Pfizer-BioNT COVID-19 Vac-TriS Susp injection Generic drug: COVID-19 mRNA Vac-TriS (Pfizer) Inject into the muscle.   potassium chloride 10 MEQ tablet Commonly known as: KLOR-CON M Take 10 mEq by mouth daily.   prednisoLONE acetate 1 % ophthalmic suspension Commonly known as: PRED FORTE SMARTSIG:In Eye(s)   Prolensa 0.07 % Soln Generic drug: Bromfenac Sodium Apply to eye.   sertraline 100  MG tablet Commonly known as: ZOLOFT Take 100 mg by mouth daily.   sildenafil 100 MG tablet Commonly known as: VIAGRA Take 100 mg by mouth as needed for erectile dysfunction.   simvastatin 20 MG tablet Commonly known as: ZOCOR TAKE 1 TABLET BY MOUTH EVERYDAY AT BEDTIME   Symbicort 160-4.5 MCG/ACT inhaler Generic drug: budesonide-formoterol INHALE 2 PUFFS INTO THE LUNGS EVERY DAY   tamsulosin 0.4 MG Caps capsule Commonly known as: FLOMAX Take 0.4 mg by mouth.   triamcinolone ointment 0.1 % Commonly known as: KENALOG Apply 1 application topically 2 (two) times daily.   Ventolin HFA 108 (90 Base) MCG/ACT inhaler Generic drug: albuterol   zolpidem 10 MG tablet Commonly known as: AMBIEN Take 10 mg by mouth at bedtime as needed for sleep.        Allergies:  Allergies  Allergen Reactions   Penicillins Rash    Past Medical History:  Diagnosis Date   Allergy    Anxiety    Arrhythmia    Asthma    Cataract    bilateral - just watching - no treatment yet   Chronic headaches    COPD (chronic obstructive pulmonary disease) (HCC)    COPD (chronic obstructive pulmonary disease) (HCC)    Depression    Diabetes (HCC)    Diverticulosis    GERD (gastroesophageal reflux disease)    Glaucoma    Hyperlipidemia    Hypertension    IBS (irritable bowel syndrome)    Pneumonia    Sleep apnea    does not use CPAP    Past Surgical History:  Procedure Laterality Date   COLONOSCOPY     ESOPHAGOGASTRODUODENOSCOPY ENDOSCOPY     LUMBAR SPINE SURGERY  2010   ROTATOR CUFF REPAIR  2008   urology surgery      Family History  Problem Relation Age of Onset   Arthritis Mother    Hypertension Mother    Heart failure Father    Diabetes Other    Colon cancer Neg Hx    Colon polyps Neg Hx    Stomach cancer Neg Hx    Rectal cancer Neg Hx     Social History:  reports that he has never smoked. He has never used smokeless tobacco. He reports current alcohol use of about 2.0  standard drinks per week. He reports that he does not use drugs.  Review of Systems:  HYPERTENSION:   well controlled with Avalide, also followed by PCP regularly On Toprol 25 mg from cardiologist  BP Readings from Last 3 Encounters:  08/22/21 118/74  11/02/20 122/64  08/23/20 125/75    HYPERLIPIDEMIA: The lipid abnormality consists of elevated LDL and low HDL treated by his PCP with simvastatin 37m long-term LDL is consistently controlled   Lab Results  Component Value Date   CHOL 130 08/15/2021   HDL 44.90 08/15/2021   LDLCALC 69 08/15/2021   TRIG 82.0 08/15/2021   CHOLHDL 3 08/15/2021    Diabetic foot exam 10/22, also  followed by podiatrist Last eye exam 04/2020 without retinopathy    Examination:   BP 118/74    Pulse 94    Ht 5' 10" (1.778 m)    Wt 175 lb 9.6 oz (79.7 kg)    SpO2 95%    BMI 25.20 kg/m   Body mass index is 25.2 kg/m.    ASSESSMENT/ PLAN:   Diabetes type 2  See history of present illness for  discussion of current diabetes management, blood sugar patterns and problems identified  A1c is very stable at 6.5  He has been treated with Janumet 50/1000, 1 tablet daily  His blood sugars are consistently controlled with only 1 tablet of Janumet without any progression of his diabetes He is motivated to keep healthy meals and maintain his weight Tries to walk as much as possible also  Recommendations: He will check his blood sugars periodically especially after meals Regular walking up to 4 days a week at least Stay on Janumet 1 tablet daily   Hyperlipidemia: controlled with LDL 70 on simvastatin Discussed need to continue this long-term  Hypertension: Well controlled on Avalide and no decrease in renal function He will also continue follow-up with PCP  Normal urine microalbumin   He will follow-up in 12 months unless he has any significant change in his blood sugars    Elayne Snare 08/22/2021, 3:14 PM

## 2021-08-22 NOTE — Patient Instructions (Addendum)
Check blood sugars on waking up 2-3 days a week  Also check blood sugars about 2 hours after meals and do this after different meals by rotation  Recommended blood sugar levels on waking up are 90-130 and about 2 hours after meal is 130-160  Please bring your blood sugar monitor to each visit, thank you   

## 2021-08-26 ENCOUNTER — Other Ambulatory Visit: Payer: Self-pay | Admitting: Endocrinology

## 2021-09-18 ENCOUNTER — Other Ambulatory Visit: Payer: Self-pay

## 2021-09-18 ENCOUNTER — Ambulatory Visit (INDEPENDENT_AMBULATORY_CARE_PROVIDER_SITE_OTHER): Payer: Medicare Other | Admitting: Podiatry

## 2021-09-18 ENCOUNTER — Encounter: Payer: Self-pay | Admitting: Podiatry

## 2021-09-18 DIAGNOSIS — M79674 Pain in right toe(s): Secondary | ICD-10-CM

## 2021-09-18 DIAGNOSIS — M79675 Pain in left toe(s): Secondary | ICD-10-CM | POA: Diagnosis not present

## 2021-09-18 DIAGNOSIS — E119 Type 2 diabetes mellitus without complications: Secondary | ICD-10-CM | POA: Diagnosis not present

## 2021-09-18 DIAGNOSIS — B351 Tinea unguium: Secondary | ICD-10-CM

## 2021-09-18 NOTE — Progress Notes (Signed)
This patient returns to my office for at risk foot care.  This patient requires this care by a professional since this patient will be at risk due to having diabetes.  This patient is unable to cut nails himself since the patient cannot reach his nails.These nails are painful walking and wearing shoes.  This patient presents for at risk foot care today.  General Appearance  Alert, conversant and in no acute stress.  Vascular  Dorsalis pedis and posterior tibial  pulses are palpable  bilaterally.  Capillary return is within normal limits  bilaterally. Temperature is within normal limits  bilaterally.  Neurologic  Senn-Weinstein monofilament wire test within normal limits  bilaterally. Muscle power within normal limits bilaterally.  Nails Thick disfigured discolored nails with subungual debris  from second  to fifth toes bilaterally. No evidence of bacterial infection or drainage bilaterally.  Orthopedic  No limitations of motion  feet .  No crepitus or effusions noted.  No bony pathology or digital deformities noted.  Skin  normotropic skin with no porokeratosis noted bilaterally.  No signs of infections or ulcers noted.     Onychomycosis  Pain in right toes  Pain in left toes  Consent was obtained for treatment procedures.   Mechanical debridement of nails 2-5  bilaterally performed with a nail nipper.  Filed with dremel without incident.    Return office visit    3 months.                 Told patient to return for periodic foot care and evaluation due to potential at risk complications.   Jonise Weightman DPM  

## 2021-10-30 LAB — HM DIABETES EYE EXAM

## 2021-10-31 ENCOUNTER — Encounter: Payer: Self-pay | Admitting: Endocrinology

## 2021-12-23 ENCOUNTER — Encounter: Payer: Self-pay | Admitting: Podiatry

## 2021-12-23 ENCOUNTER — Ambulatory Visit (INDEPENDENT_AMBULATORY_CARE_PROVIDER_SITE_OTHER): Payer: Medicare Other | Admitting: Podiatry

## 2021-12-23 DIAGNOSIS — B351 Tinea unguium: Secondary | ICD-10-CM | POA: Diagnosis not present

## 2021-12-23 DIAGNOSIS — M79674 Pain in right toe(s): Secondary | ICD-10-CM | POA: Diagnosis not present

## 2021-12-23 DIAGNOSIS — M79675 Pain in left toe(s): Secondary | ICD-10-CM | POA: Diagnosis not present

## 2021-12-23 DIAGNOSIS — E119 Type 2 diabetes mellitus without complications: Secondary | ICD-10-CM

## 2021-12-23 NOTE — Progress Notes (Signed)
This patient returns to my office for at risk foot care.  This patient requires this care by a professional since this patient will be at risk due to having diabetes.  This patient is unable to cut nails himself since the patient cannot reach his nails.These nails are painful walking and wearing shoes.  This patient presents for at risk foot care today.  General Appearance  Alert, conversant and in no acute stress.  Vascular  Dorsalis pedis and posterior tibial  pulses are palpable  bilaterally.  Capillary return is within normal limits  bilaterally. Temperature is within normal limits  bilaterally.  Neurologic  Senn-Weinstein monofilament wire test within normal limits  bilaterally. Muscle power within normal limits bilaterally.  Nails Thick disfigured discolored nails with subungual debris  from second  to fifth toes bilaterally. No evidence of bacterial infection or drainage bilaterally.  Orthopedic  No limitations of motion  feet .  No crepitus or effusions noted.  No bony pathology or digital deformities noted.  Skin  normotropic skin with no porokeratosis noted bilaterally.  No signs of infections or ulcers noted.     Onychomycosis  Pain in right toes  Pain in left toes  Consent was obtained for treatment procedures.   Mechanical debridement of nails 2-5  bilaterally performed with a nail nipper.  Filed with dremel without incident.    Return office visit    3 months.                 Told patient to return for periodic foot care and evaluation due to potential at risk complications.   Jaelynne Hockley DPM  

## 2022-02-17 ENCOUNTER — Other Ambulatory Visit: Payer: Self-pay | Admitting: Endocrinology

## 2022-03-24 ENCOUNTER — Ambulatory Visit (INDEPENDENT_AMBULATORY_CARE_PROVIDER_SITE_OTHER): Payer: Medicare Other | Admitting: Podiatry

## 2022-03-24 ENCOUNTER — Encounter: Payer: Self-pay | Admitting: Podiatry

## 2022-03-24 DIAGNOSIS — M79675 Pain in left toe(s): Secondary | ICD-10-CM

## 2022-03-24 DIAGNOSIS — B351 Tinea unguium: Secondary | ICD-10-CM | POA: Diagnosis not present

## 2022-03-24 DIAGNOSIS — E119 Type 2 diabetes mellitus without complications: Secondary | ICD-10-CM | POA: Diagnosis not present

## 2022-03-24 DIAGNOSIS — M79674 Pain in right toe(s): Secondary | ICD-10-CM

## 2022-03-24 NOTE — Progress Notes (Signed)
This patient returns to my office for at risk foot care.  This patient requires this care by a professional since this patient will be at risk due to having diabetes.  This patient is unable to cut nails himself since the patient cannot reach his nails.These nails are painful walking and wearing shoes.  This patient presents for at risk foot care today.  General Appearance  Alert, conversant and in no acute stress.  Vascular  Dorsalis pedis and posterior tibial  pulses are palpable  bilaterally.  Capillary return is within normal limits  bilaterally. Temperature is within normal limits  bilaterally.  Neurologic  Senn-Weinstein monofilament wire test within normal limits  bilaterally. Muscle power within normal limits bilaterally.  Nails Thick disfigured discolored nails with subungual debris  from second  to fifth toes bilaterally. No evidence of bacterial infection or drainage bilaterally.  Orthopedic  No limitations of motion  feet .  No crepitus or effusions noted.  No bony pathology or digital deformities noted.  Skin  normotropic skin with no porokeratosis noted bilaterally.  No signs of infections or ulcers noted.     Onychomycosis  Pain in right toes  Pain in left toes  Consent was obtained for treatment procedures.   Mechanical debridement of nails 2-5  bilaterally performed with a nail nipper.  Filed with dremel without incident.    Return office visit    3 months.                 Told patient to return for periodic foot care and evaluation due to potential at risk complications.   Yani Lal DPM  

## 2022-03-26 ENCOUNTER — Ambulatory Visit: Payer: Medicare Other | Attending: Cardiovascular Disease | Admitting: Cardiovascular Disease

## 2022-03-26 ENCOUNTER — Encounter: Payer: Self-pay | Admitting: Cardiovascular Disease

## 2022-03-26 VITALS — BP 124/72 | HR 82 | Ht 70.0 in | Wt 181.7 lb

## 2022-03-26 DIAGNOSIS — E119 Type 2 diabetes mellitus without complications: Secondary | ICD-10-CM | POA: Diagnosis not present

## 2022-03-26 DIAGNOSIS — I10 Essential (primary) hypertension: Secondary | ICD-10-CM

## 2022-03-26 DIAGNOSIS — E785 Hyperlipidemia, unspecified: Secondary | ICD-10-CM

## 2022-03-26 NOTE — Patient Instructions (Signed)
Medication Instructions:  No changes *If you need a refill on your cardiac medications before your next appointment, please call your pharmacy*   Lab Work: None ordered If you have labs (blood work) drawn today and your tests are completely normal, you will receive your results only by: MyChart Message (if you have MyChart) OR A paper copy in the mail If you have any lab test that is abnormal or we need to change your treatment, we will call you to review the results.   Testing/Procedures: None ordered   Follow-Up: At [facility], you and your health needs are our priority.  As part of our continuing mission to provide you with exceptional heart care, we have created designated Provider Care Teams.  These Care Teams include your primary Cardiologist (physician) and Advanced Practice Providers (APPs -  Physician Assistants and Nurse Practitioners) who all work together to provide you with the care you need, when you need it.  We recommend signing up for the patient portal called "MyChart".  Sign up information is provided on this After Visit Summary.  MyChart is used to connect with patients for Virtual Visits (Telemedicine).  Patients are able to view lab/test results, encounter notes, upcoming appointments, etc.  Non-urgent messages can be sent to your provider as well.   To learn more about what you can do with MyChart, go to [email or web address].    Your next appointment:   12 month(s)  The format for your next appointment:   In Person  Provider:   Mihai Croitoru, MD      Important Information About Sugar       

## 2022-03-26 NOTE — Progress Notes (Signed)
Cardiology Office Note   Date:  03/26/2022   ID:  Derek [name], DOB 15-Aug-1945, MRN [id number]  PCP:  Vincente Liberty, MD  Cardiologist:  Sanda Klein, MD  Electrophysiologist:  None   Evaluation Performed:  Follow-Up Visit  Chief Complaint:  HTN, HLP  History of Present Illness:    Derek [name] is a 76 y.o. male with type 2 diabetes mellitus, hypertension and dyslipidemia.  He has no cardiovascular complaints.  Unfortunately, recently his walking has been limited due to her bilateral knee pain.  The patient specifically denies any chest pain at rest exertion, dyspnea at rest or with exertion, orthopnea, paroxysmal nocturnal dyspnea, syncope, palpitations, focal neurological deficits, intermittent claudication, lower extremity edema, unexplained weight gain, cough, hemoptysis or wheezing.  He is an [employer] and worked for the [place] [employer].  08/15/2020 labs show good glycemic control (A1c 6.4%) and LDL levels &1), but his HDL remains low (37). Borderline triglycerides 156. BP is well controlled, usually 120s/70s.    Past Medical History:  Diagnosis Date   Allergy    Anxiety    Arrhythmia    Asthma    Cataract    bilateral - just watching - no treatment yet   Chronic headaches    COPD (chronic obstructive pulmonary disease) (HCC)    COPD (chronic obstructive pulmonary disease) (HCC)    Depression    Diabetes (HCC)    Diverticulosis    GERD (gastroesophageal reflux disease)    Glaucoma    Hyperlipidemia    Hypertension    IBS (irritable bowel syndrome)    Pneumonia    Sleep apnea    does not use CPAP   Past Surgical History:  Procedure Laterality Date   COLONOSCOPY     ESOPHAGOGASTRODUODENOSCOPY ENDOSCOPY     LUMBAR SPINE SURGERY  2010   ROTATOR CUFF REPAIR  2008   urology surgery       Current Meds  Medication Sig   aspirin 81 MG tablet Take 81 mg by mouth daily.   Blood Glucose Monitoring Suppl (ACCU-CHEK GUIDE ME) w/Device KIT 1 each by  Does not apply route daily. Use to check blood sugar once daily. DX:E11.9   esomeprazole (NEXIUM) 20 MG capsule Take 20 mg by mouth 2 (two) times daily.   glucose blood (ACCU-CHEK GUIDE) test strip Use Accu Chek Guide test strips as instructed to check blood sugar once daily. DX:E11.9   irbesartan-hydrochlorothiazide (AVALIDE) 150-12.5 MG per tablet Take 1 tablet by mouth daily at 12 noon.    JANUMET 50-1000 MG tablet TAKE 1 TABLET BY MOUTH EVERY DAY   LUMIGAN 0.01 % SOLN    metoprolol succinate (TOPROL-XL) 25 MG 24 hr tablet Take 1 tablet (25 mg total) by mouth daily.   moxifloxacin (VIGAMOX) 0.5 % ophthalmic solution Apply to eye.   potassium chloride (K-DUR,KLOR-CON) 10 MEQ tablet Take 10 mEq by mouth daily.    PROLENSA 0.07 % SOLN Apply to eye.   sertraline (ZOLOFT) 100 MG tablet Take 100 mg by mouth daily.   simvastatin (ZOCOR) 20 MG tablet TAKE 1 TABLET BY MOUTH EVERYDAY AT BEDTIME   simvastatin (ZOCOR) 40 MG tablet 1 tablet every evening   SYMBICORT 160-4.5 MCG/ACT inhaler INHALE 2 PUFFS INTO THE LUNGS EVERY DAY   tamsulosin (FLOMAX) 0.4 MG CAPS capsule Take 0.4 mg by mouth.     Allergies:   Penicillins   Social History   Tobacco Use   Smoking status: Never   Smokeless tobacco:  Never  Vaping Use   Vaping Use: Never used  Substance Use Topics   Alcohol use: Not Currently    Alcohol/week: 2.0 standard drinks of alcohol    Types: 2 Shots of liquor per week   Drug use: No     Family Hx: The patient's family history includes Arthritis in his mother; Diabetes in an other family member; Heart failure in his father; Hypertension in his mother. There is no history of Colon cancer, Colon polyps, Stomach cancer, or Rectal cancer.  ROS:   Please see the history of present illness.     All other systems reviewed and are negative.   Prior CV studies:   The following studies were reviewed today: Labs from Dr. Dwyane Dee  Labs/Other Tests and Data Reviewed:    EKG: performed today,  shows NSR, normal tracing  Recent Labs: 08/15/2021: ALT 11; BUN 15; Creatinine, Ser 0.94; Potassium 4.3; Sodium 140   Recent Lipid Panel Lab Results  Component Value Date/Time   CHOL 130 08/15/2021 03:02 PM   TRIG 82.0 08/15/2021 03:02 PM   HDL 44.90 08/15/2021 03:02 PM   CHOLHDL 3 08/15/2021 03:02 PM   LDLCALC 69 08/15/2021 03:02 PM    Wt Readings from Last 3 Encounters:  03/26/22 181 lb 11.2 oz (82.4 kg)  08/22/21 175 lb 9.6 oz (79.7 kg)  11/02/20 173 lb 6.4 oz (78.7 kg)     Objective:    Vital Signs:  BP 124/72 (BP Location: Left Arm, Patient Position: Sitting, Cuff Size: Normal)   Pulse 82   Ht _0  (1.778 m)   Wt 181 lb 11.2 oz (82.4 kg)   SpO2 96%   BMI 26.07 kg/m      General: Alert, oriented x3, no distress, appears fit, looks younger than stated age Head: no evidence of trauma, PERRL, EOMI, no exophtalmos or lid lag, no myxedema, no xanthelasma; normal ears, nose and oropharynx Neck: normal jugular venous pulsations and no hepatojugular reflux; brisk carotid pulses without delay and no carotid bruits Chest: clear to auscultation, no signs of consolidation by percussion or palpation, normal fremitus, symmetrical and full respiratory excursions Cardiovascular: normal position and quality of the apical impulse, regular rhythm, normal first and second heart sounds, no murmurs, rubs or gallops Abdomen: no tenderness or distention, no masses by palpation, no abnormal pulsatility or arterial bruits, normal bowel sounds, no hepatosplenomegaly Extremities: no clubbing, cyanosis or edema; 2+ radial, ulnar and brachial pulses bilaterally; 2+ right femoral, posterior tibial and dorsalis pedis pulses; 2+ left femoral, posterior tibial and dorsalis pedis pulses; no subclavian or femoral bruits Neurological: grossly nonfocal Psych: Normal mood and affect    ASSESSMENT & PLAN:    1. Essential hypertension   2. Controlled type 2 diabetes mellitus without complication, without  long-term current use of insulin ([place])   3. Dyslipidemia (high LDL; low HDL)      HTN: Controlled DM: Good glycemic control HLP: Excellent LDL.  Continue statin.  Triglycerides are now normal and HDL has improved as well.  If knee problems are interfering with his regular exercise, I would advise getting advice from orthopedic specialist regarding possible joint injections with hyaluronic acid or even knee replacement if necessary.   There are no Patient Instructions on file for this visit.   Tests Ordered: No orders of the defined types were placed in this encounter.   Medication Changes: No orders of the defined types were placed in this encounter.   Follow Up:  In Person  1  year  Signed, Sanda Klein, MD  03/26/2022 9:13 AM    [place]

## 2022-06-11 ENCOUNTER — Other Ambulatory Visit: Payer: Self-pay | Admitting: Endocrinology

## 2022-06-23 ENCOUNTER — Ambulatory Visit (INDEPENDENT_AMBULATORY_CARE_PROVIDER_SITE_OTHER): Payer: Medicare Other | Admitting: Podiatry

## 2022-06-23 ENCOUNTER — Encounter: Payer: Self-pay | Admitting: Podiatry

## 2022-06-23 DIAGNOSIS — M79674 Pain in right toe(s): Secondary | ICD-10-CM

## 2022-06-23 DIAGNOSIS — B351 Tinea unguium: Secondary | ICD-10-CM | POA: Diagnosis not present

## 2022-06-23 DIAGNOSIS — M79675 Pain in left toe(s): Secondary | ICD-10-CM

## 2022-06-23 DIAGNOSIS — E119 Type 2 diabetes mellitus without complications: Secondary | ICD-10-CM

## 2022-06-23 NOTE — Progress Notes (Signed)
This patient returns to my office for at risk foot care.  This patient requires this care by a professional since this patient will be at risk due to having diabetes.  This patient is unable to cut nails himself since the patient cannot reach his nails.These nails are painful walking and wearing shoes.  This patient presents for at risk foot care today.  General Appearance  Alert, conversant and in no acute stress.  Vascular  Dorsalis pedis and posterior tibial  pulses are palpable  bilaterally.  Capillary return is within normal limits  bilaterally. Temperature is within normal limits  bilaterally.  Neurologic  Senn-Weinstein monofilament wire test within normal limits  bilaterally. Muscle power within normal limits bilaterally.  Nails Thick disfigured discolored nails with subungual debris  from second  to fifth toes bilaterally. No evidence of bacterial infection or drainage bilaterally.  Orthopedic  No limitations of motion  feet .  No crepitus or effusions noted.  No bony pathology or digital deformities noted.  Skin  normotropic skin with no porokeratosis noted bilaterally.  No signs of infections or ulcers noted.     Onychomycosis  Pain in right toes  Pain in left toes  Consent was obtained for treatment procedures.   Mechanical debridement of nails 2-5  bilaterally performed with a nail nipper.  Filed with dremel without incident.    Return office visit    3 months.                 Told patient to return for periodic foot care and evaluation due to potential at risk complications.   Ayano Douthitt DPM  

## 2022-08-25 ENCOUNTER — Other Ambulatory Visit: Payer: Self-pay | Admitting: Endocrinology

## 2022-08-25 DIAGNOSIS — E119 Type 2 diabetes mellitus without complications: Secondary | ICD-10-CM

## 2022-08-25 DIAGNOSIS — E7800 Pure hypercholesterolemia, unspecified: Secondary | ICD-10-CM

## 2022-08-26 ENCOUNTER — Other Ambulatory Visit (INDEPENDENT_AMBULATORY_CARE_PROVIDER_SITE_OTHER): Payer: Medicare Other

## 2022-08-26 DIAGNOSIS — E7800 Pure hypercholesterolemia, unspecified: Secondary | ICD-10-CM | POA: Diagnosis not present

## 2022-08-26 DIAGNOSIS — E119 Type 2 diabetes mellitus without complications: Secondary | ICD-10-CM | POA: Diagnosis not present

## 2022-08-27 LAB — COMPREHENSIVE METABOLIC PANEL
ALT: 23 U/L (ref 0–53)
AST: 20 U/L (ref 0–37)
Albumin: 4.2 g/dL (ref 3.5–5.2)
Alkaline Phosphatase: 94 U/L (ref 39–117)
BUN: 13 mg/dL (ref 6–23)
CO2: 29 mEq/L (ref 19–32)
Calcium: 9.9 mg/dL (ref 8.4–10.5)
Chloride: 101 mEq/L (ref 96–112)
Creatinine, Ser: 0.99 mg/dL (ref 0.40–1.50)
GFR: 73.26 mL/min (ref >60.00)
Glucose, Bld: 242 mg/dL — ABNORMAL HIGH (ref 70–99)
Potassium: 4.4 mEq/L (ref 3.5–5.1)
Sodium: 136 mEq/L (ref 135–145)
Total Bilirubin: 0.4 mg/dL (ref 0.2–1.2)
Total Protein: 7.9 g/dL (ref 6.0–8.3)

## 2022-08-27 LAB — LIPID PANEL
Cholesterol: 141 mg/dL (ref 0–200)
HDL: 36.4 mg/dL — ABNORMAL LOW (ref >39.00)
NonHDL: 104.54
Total CHOL/HDL Ratio: 4
Triglycerides: 217 mg/dL — ABNORMAL HIGH (ref 0.0–149.0)
VLDL: 43.4 mg/dL — ABNORMAL HIGH (ref 0.0–40.0)

## 2022-08-27 LAB — LDL CHOLESTEROL, DIRECT: Direct LDL: 80 mg/dL

## 2022-08-27 LAB — MICROALBUMIN / CREATININE URINE RATIO
Creatinine,U: 92.9 mg/dL
Microalb Creat Ratio: 1.5 mg/g (ref 0.0–30.0)
Microalb, Ur: 1.4 mg/dL (ref 0.0–1.9)

## 2022-08-27 LAB — HEMOGLOBIN A1C: Hgb A1c MFr Bld: 8.8 % — ABNORMAL HIGH (ref 4.6–6.5)

## 2022-08-28 ENCOUNTER — Ambulatory Visit (INDEPENDENT_AMBULATORY_CARE_PROVIDER_SITE_OTHER): Payer: Medicare Other | Admitting: Endocrinology

## 2022-08-28 ENCOUNTER — Ambulatory Visit: Payer: Medicare Other | Admitting: Endocrinology

## 2022-08-28 ENCOUNTER — Encounter: Payer: Self-pay | Admitting: Endocrinology

## 2022-08-28 VITALS — BP 128/76 | HR 81 | Ht 70.0 in | Wt 184.8 lb

## 2022-08-28 DIAGNOSIS — E7800 Pure hypercholesterolemia, unspecified: Secondary | ICD-10-CM | POA: Diagnosis not present

## 2022-08-28 DIAGNOSIS — E1165 Type 2 diabetes mellitus with hyperglycemia: Secondary | ICD-10-CM

## 2022-08-28 DIAGNOSIS — I10 Essential (primary) hypertension: Secondary | ICD-10-CM

## 2022-08-28 MED ORDER — EMPAGLIFLOZIN 10 MG PO TABS: 10.0000 mg | ORAL_TABLET | Freq: Every day | ORAL | 3 refills | Status: DC

## 2022-08-28 MED ORDER — ACCU-CHEK GUIDE VI STRP: ORAL_STRIP | 2 refills | Status: DC

## 2022-08-28 NOTE — Patient Instructions (Addendum)
Take 1/2 Avalide with jardiance, check BP weekly  Drink  water, less juice  Check blood sugars on waking up 2 days a week  Also check blood sugars about 2 hours after meals and do this after different meals by rotation  Recommended blood sugar levels on waking up are 90-130 and about 2 hours after meal is 130-160  Please bring your blood sugar monitor to each visit, thank you  Exercise bike

## 2022-08-28 NOTE — Progress Notes (Signed)
Patient ID: Derek [name], male   DOB: 29-Aug-1945, 77 y.o.   MRN: [id number]           Reason for Appointment: Endocrinology follow-up   History of Present Illness   Diagnosis: Type 2 DIABETES MELITUS, date of diagnosis: 2008      Past history: Although his glucose was 600 at diagnosis he was initially treated with metformin and subsequently had relatively milder diabetes Januvia was added to his regimen in 2011  Recent history:  Current non-insulin hypoglycemic drugs: Janumet 50/1000 once daily  A1c which was stable at 6.5 is now 8.8  He apparently has not checked his blood sugar at home in several months and likely does not have any test trips Despite his high blood sugars including lab glucose of 242 he says he has not had any increased urination, thirst, fatigue or blurred vision His weight is about 9 pounds higher than last year He is generally avoiding fast food However has not done any walking lately and is having more knee pain He is usually having a breakfast of fruit smoothies recently He takes the Janumet 50/1000 mg daily regularly and has not missed any doses  Side effects from medications: None       Monitors blood glucose:  less than once a day.    Glucometer:  FreeStyle            Blood Glucose readings not available           Dietician visit: Most recent: Unknown           Wt Readings from Last 3 Encounters:  08/28/22 184 lb 12.8 oz (83.8 kg)  03/26/22 181 lb 11.2 oz (82.4 kg)  08/22/21 175 lb 9.6 oz (79.7 kg)   Lab Results  Component Value Date   HGBA1C 8.8 (H) 08/26/2022   HGBA1C 6.5 08/15/2021   HGBA1C 6.4 08/20/2020   Lab Results  Component Value Date   MICROALBUR 1.4 08/26/2022   LDLCALC 69 08/15/2021   CREATININE 0.99 08/26/2022    Lab on 08/26/2022  Component Date Value Ref Range Status   Cholesterol 08/26/2022 141  0 - 200 mg/dL Final   ATP III Classification       Desirable:  < 200 mg/dL               Borderline High:  200 - 239 mg/dL           High:  > = 240 mg/dL   Triglycerides 08/26/2022 217.0 (H)  0.0 - 149.0 mg/dL Final   Normal:  <150 mg/dLBorderline High:  150 - 199 mg/dL   HDL 08/26/2022 36.40 (L)  >39.00 mg/dL Final   VLDL 08/26/2022 43.4 (H)  0.0 - 40.0 mg/dL Final   Total CHOL/HDL Ratio 08/26/2022 4   Final                  Men          Women1/2 Average Risk     3.4          3.3Average Risk          5.0          4.42X Average Risk          9.6          7.13X Average Risk          15.0          11.0  NonHDL 08/26/2022 104.54   Final   NOTE:  Non-HDL goal should be 30 mg/dL higher than patient's LDL goal (i.e. LDL goal of < 70 mg/dL, would have non-HDL goal of < 100 mg/dL)   Microalb, Ur 08/26/2022 1.4  0.0 - 1.9 mg/dL Final   Creatinine,U 08/26/2022 92.9  mg/dL Final   Microalb Creat Ratio 08/26/2022 1.5  0.0 - 30.0 mg/g Final   Sodium 08/26/2022 136  135 - 145 mEq/L Final   Potassium 08/26/2022 4.4  3.5 - 5.1 mEq/L Final   Chloride 08/26/2022 101  96 - 112 mEq/L Final   CO2 08/26/2022 29  19 - 32 mEq/L Final   Glucose, Bld 08/26/2022 242 (H)  70 - 99 mg/dL Final   BUN 08/26/2022 13  6 - 23 mg/dL Final   Creatinine, Ser 08/26/2022 0.99  0.40 - 1.50 mg/dL Final   Total Bilirubin 08/26/2022 0.4  0.2 - 1.2 mg/dL Final   Alkaline Phosphatase 08/26/2022 94  39 - 117 U/L Final   AST 08/26/2022 20  0 - 37 U/L Final   ALT 08/26/2022 23  0 - 53 U/L Final   Total Protein 08/26/2022 7.9  6.0 - 8.3 g/dL Final   Albumin 08/26/2022 4.2  3.5 - 5.2 g/dL Final   GFR 08/26/2022 73.26  >60.00 mL/min Final   Calculated using the CKD-EPI Creatinine Equation (2021)   Calcium 08/26/2022 9.9  8.4 - 10.5 mg/dL Final   Hgb A1c MFr Bld 08/26/2022 8.8 (H)  4.6 - 6.5 % Final   Glycemic Control Guidelines for People with Diabetes:Non Diabetic:  <6%Goal of Therapy: <7%Additional Action Suggested:  >8%    Direct LDL 08/26/2022 80.0  mg/dL Final   Optimal:  <100 mg/dLNear or Above Optimal:  100-129 mg/dLBorderline  High:  130-159 mg/dLHigh:  160-189 mg/dLVery High:  >190 mg/dL    Allergies as of 08/28/2022       Reactions   Penicillins Rash        Medication List        Accurate as of August 28, 2022 11:41 AM. If you have any questions, ask your nurse or doctor.          Accu-Chek FastClix Lancets Misc Use Accu Chek fastclix to check blood sugar once daily. DX:E11.9   Accu-Chek Guide Me w/Device Kit 1 each by Does not apply route daily. Use to check blood sugar once daily. DX:E11.9   Accu-Chek Guide test strip Generic drug: glucose blood Use Accu Chek Guide test strips as instructed to check blood sugar once daily. DX:E11.9   aspirin 81 MG tablet Take 81 mg by mouth daily.   chlordiazePOXIDE 25 MG capsule Commonly known as: LIBRIUM Take 25 mg by mouth daily as needed.   diclofenac 50 MG EC tablet Commonly known as: VOLTAREN   esomeprazole 20 MG capsule Commonly known as: NEXIUM Take 20 mg by mouth 2 (two) times daily.   HYDROcodone-acetaminophen 5-325 MG tablet Commonly known as: NORCO/VICODIN Take 1 tablet by mouth every 6 (six) hours as needed for pain.   irbesartan-hydrochlorothiazide 150-12.5 MG tablet Commonly known as: AVALIDE Take 1 tablet by mouth daily at 12 noon.   Janumet 50-1000 MG tablet Generic drug: sitaGLIPtin-metformin TAKE 1 TABLET BY MOUTH EVERY DAY   Lumigan 0.01 % Soln Generic drug: bimatoprost   meclizine 25 MG tablet Commonly known as: ANTIVERT Take 25 mg by mouth as needed.   metoprolol succinate 25 MG 24 hr tablet Commonly known as: TOPROL-XL Take 1 tablet (25  mg total) by mouth daily.   moxifloxacin 0.5 % ophthalmic solution Commonly known as: VIGAMOX Apply to eye.   potassium chloride 10 MEQ tablet Commonly known as: KLOR-CON M Take 10 mEq by mouth daily.   Prolensa 0.07 % Soln Generic drug: Bromfenac Sodium Apply to eye.   sertraline 100 MG tablet Commonly known as: ZOLOFT Take 100 mg by mouth daily.   sildenafil  100 MG tablet Commonly known as: VIAGRA Take 100 mg by mouth as needed for erectile dysfunction.   Zocor 40 MG tablet Generic drug: simvastatin 1 tablet every evening   simvastatin 20 MG tablet Commonly known as: ZOCOR TAKE 1 TABLET BY MOUTH EVERYDAY AT BEDTIME   Symbicort 160-4.5 MCG/ACT inhaler Generic drug: budesonide-formoterol INHALE 2 PUFFS INTO THE LUNGS EVERY DAY   tamsulosin 0.4 MG Caps capsule Commonly known as: FLOMAX Take 0.4 mg by mouth.   triamcinolone cream 0.1 % Commonly known as: KENALOG   triamcinolone ointment 0.1 % Commonly known as: KENALOG Apply 1 application topically 2 (two) times daily.   Ventolin HFA 108 (90 Base) MCG/ACT inhaler Generic drug: albuterol   zolpidem 10 MG tablet Commonly known as: AMBIEN Take 10 mg by mouth at bedtime as needed for sleep.   ZyrTEC Allergy 10 MG tablet Generic drug: cetirizine        Allergies:  Allergies  Allergen Reactions   Penicillins Rash    Past Medical History:  Diagnosis Date   Allergy    Anxiety    Arrhythmia    Asthma    Cataract    bilateral - just watching - no treatment yet   Chronic headaches    COPD (chronic obstructive pulmonary disease) (HCC)    COPD (chronic obstructive pulmonary disease) (HCC)    Depression    Diabetes (HCC)    Diverticulosis    GERD (gastroesophageal reflux disease)    Glaucoma    Hyperlipidemia    Hypertension    IBS (irritable bowel syndrome)    Pneumonia    Sleep apnea    does not use CPAP    Past Surgical History:  Procedure Laterality Date   COLONOSCOPY     ESOPHAGOGASTRODUODENOSCOPY ENDOSCOPY     LUMBAR SPINE SURGERY  2010   ROTATOR CUFF REPAIR  2008   urology surgery      Family History  Problem Relation Age of Onset   Arthritis Mother    Hypertension Mother    Heart failure Father    Diabetes Other    Colon cancer Neg Hx    Colon polyps Neg Hx    Stomach cancer Neg Hx    Rectal cancer Neg Hx     Social History:  reports that  he has never smoked. He has never used smokeless tobacco. He reports that he does not currently use alcohol after a past usage of about 2.0 standard drinks of alcohol per week. He reports that he does not use drugs.  Review of Systems:  HYPERTENSION:  well controlled with Avalide, also followed by PCP regularly On Toprol 25 mg from cardiologist  BP Readings from Last 3 Encounters:  08/28/22 128/76  03/26/22 124/72  08/22/21 118/74    HYPERLIPIDEMIA: The lipid abnormality consists of elevated LDL and low HDL treated by his PCP with simvastatin 11m long-term LDL is consistently controlled, nonfasting triglycerides are high   Lab Results  Component Value Date   CHOL 141 08/26/2022   HDL 36.40 (L) 08/26/2022   LDLCALC 69 08/15/2021   LDLDIRECT  80.0 08/26/2022   TRIG 217.0 (H) 08/26/2022   CHOLHDL 4 08/26/2022    Diabetic foot exam 10/22, also followed by podiatrist 12/23 Last eye exam 04/2020 without retinopathy    Examination:   BP 128/76 (BP Location: Left Arm, Patient Position: Sitting, Cuff Size: Normal)   Pulse 81   Ht 5' 10"$  (1.778 m)   Wt 184 lb 12.8 oz (83.8 kg)   SpO2 97%   BMI 26.52 kg/m   Body mass index is 26.52 kg/m.    ASSESSMENT/ PLAN:   Diabetes type 2  See history of present illness for  discussion of current diabetes management, blood sugar patterns and problems identified  A1c is 8.8  He has been treated with Janumet 50/1000, 1 tablet daily  His blood sugars previously were consistently controlled with only 1 tablet of Janumet Now his blood sugars are significantly higher with A1c 8.8 compared to 6.5 and nonfasting glucose 242 Unclear when his blood sugars have started going up because of lack of pulm monitor Also asymptomatic Today discussed that he likely has had some progression with diabetes especially with his tendency to gain weight lately  Recommendations: He will start checking his blood sugars daily including 2 hours after meals New  prescription for Accu-Chek strips has been sent Blood sugar targets at various times was discussed If he cannot walk he can start using the exercise bike at the gym regularly Start taking Jardiance 10 mg daily Discussed action of SGLT 2 drugs on lowering glucose by decreasing kidney absorption of glucose, benefits of weight loss and lower blood pressure, possible side effects including candidiasis and fluid depression He will increase his fluid intake Reduce Avalide to half tablet temporarily and continue monitoring blood pressure Avoid high-fat foods Avoid a lot of foods including tropical fruit smoothies Consultation with dietitian Stay on Janumet 1 tablet daily Follow-up in about 2 months to review his progress   Hyperlipidemia: controlled with LDL controlled with simvastatin as before Continue low-fat diet  Hypertension: As above, to check blood pressure more regularly with adding Jardiance  Currently no evidence of diabetic complications     Elayne Snare 08/28/2022, 11:41 AM

## 2022-09-04 ENCOUNTER — Encounter: Payer: Self-pay | Admitting: Endocrinology

## 2022-09-04 ENCOUNTER — Other Ambulatory Visit: Payer: Self-pay | Admitting: Endocrinology

## 2022-09-04 DIAGNOSIS — E1165 Type 2 diabetes mellitus with hyperglycemia: Secondary | ICD-10-CM

## 2022-09-17 ENCOUNTER — Encounter: Payer: Self-pay | Admitting: Podiatry

## 2022-09-17 ENCOUNTER — Ambulatory Visit (INDEPENDENT_AMBULATORY_CARE_PROVIDER_SITE_OTHER): Payer: Medicare Other | Admitting: Podiatry

## 2022-09-17 DIAGNOSIS — B351 Tinea unguium: Secondary | ICD-10-CM

## 2022-09-17 DIAGNOSIS — M79675 Pain in left toe(s): Secondary | ICD-10-CM | POA: Diagnosis not present

## 2022-09-17 DIAGNOSIS — M79674 Pain in right toe(s): Secondary | ICD-10-CM | POA: Diagnosis not present

## 2022-09-17 DIAGNOSIS — E119 Type 2 diabetes mellitus without complications: Secondary | ICD-10-CM | POA: Diagnosis not present

## 2022-09-17 NOTE — Progress Notes (Signed)
This patient returns to my office for at risk foot care.  This patient requires this care by a professional since this patient will be at risk due to having diabetes.  This patient is unable to cut nails himself since the patient cannot reach his nails.These nails are painful walking and wearing shoes.  This patient presents for at risk foot care today.  General Appearance  Alert, conversant and in no acute stress.  Vascular  Dorsalis pedis and posterior tibial  pulses are palpable  bilaterally.  Capillary return is within normal limits  bilaterally. Temperature is within normal limits  bilaterally.  Neurologic  Senn-Weinstein monofilament wire test within normal limits  bilaterally. Muscle power within normal limits bilaterally.  Nails Thick disfigured discolored nails with subungual debris  from second  to fifth toes bilaterally. No evidence of bacterial infection or drainage bilaterally.  Orthopedic  No limitations of motion  feet .  No crepitus or effusions noted.  No bony pathology or digital deformities noted.  Skin  normotropic skin with no porokeratosis noted bilaterally.  No signs of infections or ulcers noted.     Onychomycosis  Pain in right toes  Pain in left toes  Consent was obtained for treatment procedures.   Mechanical debridement of nails 2-5  bilaterally performed with a nail nipper.  Filed with dremel without incident.    Return office visit    3 months.                 Told patient to return for periodic foot care and evaluation due to potential at risk complications.   Gardiner Barefoot DPM

## 2022-09-23 ENCOUNTER — Ambulatory Visit: Payer: Medicare Other | Admitting: Podiatry

## 2022-10-16 ENCOUNTER — Encounter: Payer: Medicare Other | Attending: Pulmonary Disease | Admitting: Skilled Nursing Facility1

## 2022-10-16 ENCOUNTER — Encounter: Payer: Self-pay | Admitting: Skilled Nursing Facility1

## 2022-10-16 VITALS — Ht 70.5 in | Wt 181.0 lb

## 2022-10-16 DIAGNOSIS — E119 Type 2 diabetes mellitus without complications: Secondary | ICD-10-CM | POA: Diagnosis present

## 2022-10-16 NOTE — Progress Notes (Signed)
Pt states he would like to keep his sugar numbers down.   DM: Janumet Jardiance    Other Dx: Depression Anxiety COPD Hyperlipidemia HTN GERD OSA Headaches Diverticulosis IBS   Pt states he does have the blues. Pt states he checks his blood sugars 2-3 times a day: before breakfast 121, 99 (sometimes eats int he middle of the night); middle of t eh day 120-130 and before bed 120,130. Pt states when he got a reading of 55 he ate something.  Pt states he was eating a lot of everyhting he wanted but has made a lot of changes since seeing his A1C had gone up.  Goals: Be sure to tell dr. Dwyane Dee you workout for 2 hours at a time Before eating in the morning 80-130 blood sugars and then 2 hours after eating under 188 -Always bring your meter with you everywhere you go -Always Properly dispose of your needles:  -Discard in a hard plastic/metal container with a lid (something the needle can't puncture)  -Write Do Not Recycle on the outside of the container  -Example: A laundry detergent bottle -Never use the same needle more than once -Eat 2-3 carbohydrate choices for each meal and 1 for each snack -A meal: carbohydrates, protein, vegetable -A snack: A Fruit OR Vegetable AND Protein  -Try to be more active -Always pay attention to your body keeping watchful of possible low blood sugar (below 70) or high blood sugar (above 200) -Check your feet every day looking for anything that was not there the day before -Choose whole wheat breads and pastas and brown rice  Diabetes Self-Management Education  Visit Type: First/Initial  10/16/2022  Mr. Berlinda Last, identified by name and date of birth, is a 77 y.o. male with a diagnosis of Diabetes: Type 2.   ASSESSMENT  Height 5' 10.5" (1.791 m), weight 181 lb (82.1 kg). Body mass index is 25.6 kg/m.   Diabetes Self-Management Education - 10/16/22 1412       Visit Information   Visit Type First/Initial      Initial Visit   Diabetes  Type Type 2    Are you currently following a meal plan? No    Are you taking your medications as prescribed? Yes      Health Coping   How would you rate your overall health? Poor      Psychosocial Assessment   Patient Belief/Attitude about Diabetes Motivated to manage diabetes    What is the hardest part about your diabetes right now, causing you the most concern, or is the most worrisome to you about your diabetes?   Making healty food and beverage choices    Self-care barriers None    Self-management support Family    Patient Concerns Nutrition/Meal planning;Weight Control    Special Needs None    Preferred Learning Style Visual    Learning Readiness Contemplating    How often do you need to have someone help you when you read instructions, pamphlets, or other written materials from your doctor or pharmacy? 1 - Never      Pre-Education Assessment   Patient understands the diabetes disease and treatment process. Needs Instruction    Patient understands incorporating nutritional management into lifestyle. Needs Instruction    Patient undertands incorporating physical activity into lifestyle. Needs Instruction    Patient understands using medications safely. Needs Instruction    Patient understands monitoring blood glucose, interpreting and using results Needs Instruction    Patient understands prevention, detection, and treatment of  acute complications. Needs Instruction    Patient understands prevention, detection, and treatment of chronic complications. Needs Instruction    Patient understands how to develop strategies to address psychosocial issues. Needs Instruction    Patient understands how to develop strategies to promote health/change behavior. Needs Instruction      Complications   Last HgB A1C per patient/outside source 8.8 %    How often do you check your blood sugar? 1-2 times/day    Fasting Blood glucose range (mg/dL) 70-129;130-179    Postprandial Blood glucose range  (mg/dL) 70-129;130-179    Number of hypoglycemic episodes per month 0    Number of hyperglycemic episodes ( >200mg /dL): Never    Can you tell when your blood sugar is high? No    Have you had a dilated eye exam in the past 12 months? Yes    Have you had a dental exam in the past 12 months? Yes    Are you checking your feet? Yes    How many days per week are you checking your feet? 7      Dietary Intake   Breakfast 8am-12pm: grits, eggs, bacon, oatmeal, special K cereal, salad    Lunch pack of crackers  + glucerna + banana    Dinner 3pm-5pm: vegetables: steak or chicken or salad or peas or beans or okra or pickle    Beverage(s) water, coffee + cream, splenda      Activity / Exercise   Activity / Exercise Type Light (walking / raking leaves)    How many days per week do you exercise? 120    How many minutes per day do you exercise? 3    Total minutes per week of exercise 360      Patient Education   Previous Diabetes Education No    Disease Pathophysiology Factors that contribute to the development of diabetes    Healthy Eating Role of diet in the treatment of diabetes and the relationship between the three main macronutrients and blood glucose level;Food label reading, portion sizes and measuring food.;Plate Method;Carbohydrate counting;Information on hints to eating out and maintain blood glucose control.    Being Active Role of exercise on diabetes management, blood pressure control and cardiac health.;Helped patient identify appropriate exercises in relation to his/her diabetes, diabetes complications and other health issue.    Medications Reviewed patients medication for diabetes, action, purpose, timing of dose and side effects.    Monitoring Taught/evaluated SMBG meter.;Purpose and frequency of SMBG.;Daily foot exams;Yearly dilated eye exam;Identified appropriate SMBG and/or A1C goals.    Acute complications Taught prevention, symptoms, and  treatment of hypoglycemia - the 15  rule.;Discussed and identified patients' prevention, symptoms, and treatment of hyperglycemia.    Chronic complications Dental care;Retinopathy and reason for yearly dilated eye exams;Nephropathy, what it is, prevention of, the use of ACE, ARB's and early detection of through urine microalbumia.      Individualized Goals (developed by patient)   Nutrition Follow meal plan discussed;General guidelines for healthy choices and portions discussed    Physical Activity Exercise 3-5 times per week;45 minutes per day;60 minutes per day    Monitoring  Test blood glucose pre and post meals as discussed    Problem Solving Eating Pattern      Post-Education Assessment   Patient understands the diabetes disease and treatment process. Demonstrates understanding / competency    Patient understands incorporating nutritional management into lifestyle. Demonstrates understanding / competency    Patient undertands incorporating physical activity into lifestyle. Demonstrates  understanding / competency    Patient understands using medications safely. Demonstrates understanding / competency    Patient understands monitoring blood glucose, interpreting and using results Demonstrates understanding / competency    Patient understands prevention, detection, and treatment of acute complications. Demonstrates understanding / competency    Patient understands prevention, detection, and treatment of chronic complications. Demonstrates understanding / competency    Patient understands how to develop strategies to address psychosocial issues. Demonstrates understanding / competency    Patient understands how to develop strategies to promote health/change behavior. Demonstrates understanding / competency      Outcomes   Expected Outcomes Demonstrated interest in learning. Expect positive outcomes    Future DMSE PRN    Program Status Completed             Individualized Plan for Diabetes Self-Management Training:    Learning Objective:  Patient will have a greater understanding of diabetes self-management. Patient education plan is to attend individual and/or group sessions per assessed needs and concerns.   Plan:   There are no Patient Instructions on file for this visit.  Expected Outcomes:  Demonstrated interest in learning. Expect positive outcomes  Education material provided: ADA - How to Thrive: A Guide for Your Journey with Diabetes, Food label handouts, My Plate, and Snack sheet  If problems or questions, patient to contact team via:  Phone and Email  Future DSME appointment: PRN.

## 2022-11-27 ENCOUNTER — Ambulatory Visit (HOSPITAL_BASED_OUTPATIENT_CLINIC_OR_DEPARTMENT_OTHER): Payer: Medicare Other | Admitting: Internal Medicine

## 2022-12-10 ENCOUNTER — Other Ambulatory Visit (INDEPENDENT_AMBULATORY_CARE_PROVIDER_SITE_OTHER): Payer: Medicare Other

## 2022-12-10 DIAGNOSIS — E1165 Type 2 diabetes mellitus with hyperglycemia: Secondary | ICD-10-CM

## 2022-12-10 LAB — BASIC METABOLIC PANEL
BUN: 13 mg/dL (ref 6–23)
CO2: 29 mEq/L (ref 19–32)
Calcium: 9.7 mg/dL (ref 8.4–10.5)
Chloride: 104 mEq/L (ref 96–112)
Creatinine, Ser: 1.14 mg/dL (ref 0.40–1.50)
GFR: 61.73 mL/min (ref >60.00)
Glucose, Bld: 140 mg/dL — ABNORMAL HIGH (ref 70–99)
Potassium: 4.4 mEq/L (ref 3.5–5.1)
Sodium: 140 mEq/L (ref 135–145)

## 2022-12-11 LAB — FRUCTOSAMINE: Fructosamine: 269 umol/L (ref 0–285)

## 2022-12-15 ENCOUNTER — Encounter: Payer: Self-pay | Admitting: Endocrinology

## 2022-12-15 ENCOUNTER — Ambulatory Visit (INDEPENDENT_AMBULATORY_CARE_PROVIDER_SITE_OTHER): Payer: Medicare Other | Admitting: Endocrinology

## 2022-12-15 VITALS — BP 138/80 | HR 77 | Ht 70.5 in | Wt 177.4 lb

## 2022-12-15 DIAGNOSIS — E1165 Type 2 diabetes mellitus with hyperglycemia: Secondary | ICD-10-CM | POA: Diagnosis not present

## 2022-12-15 DIAGNOSIS — Z7984 Long term (current) use of oral hypoglycemic drugs: Secondary | ICD-10-CM | POA: Diagnosis not present

## 2022-12-15 LAB — POCT GLYCOSYLATED HEMOGLOBIN (HGB A1C): Hemoglobin A1C: 6.3 % — AB (ref 4.0–5.6)

## 2022-12-15 MED ORDER — ACCU-CHEK GUIDE VI STRP: ORAL_STRIP | 2 refills | Status: AC

## 2022-12-15 NOTE — Patient Instructions (Signed)
Take Janumet at supper time

## 2022-12-15 NOTE — Progress Notes (Unsigned)
Patient ID: Derek [name], male   DOB: 10-Sep-1945, 77 y.o.   MRN: [id number]           Reason for Appointment: Endocrinology follow-up   History of Present Illness   Diagnosis: Type 2 DIABETES MELITUS, date of diagnosis: 2008      Past history: Although his glucose was 600 at diagnosis he was initially treated with metformin and subsequently had relatively milder diabetes Januvia was added to his regimen in 2011  Recent history:  Current non-insulin hypoglycemic drugs: Janumet 50/1000 once daily  A1c which was  8.8 and now 6.3 Fructosamine 269  He has checked his blood sugar at home generally twice a day or more since he has last been seen However appears to have higher readings fasting compared to later in the day Generally not taking blood sugars after dinner also He has seen the dietitian and has radically improved his diet with portions and carbohydrate controlled Also exercising 3-4 times a week at the gym for about an hour He did not feel any different with change in his blood sugars, previous lab glucose was 242 His weight is about 4 pounds less He takes the Janumet 50/1000 mg daily regularly in the morning   Side effects from medications: None       Monitors blood glucose:  less than once a day.    Glucometer:  FreeStyle            Blood Glucose readings    PRE-MEAL Fasting Lunch Evening Bedtime Overall  Glucose range: 117-141 75-134 88-155    Mean/median:     120   Dietician visit: Most recent: 4/24    Wt Readings from Last 3 Encounters:  12/15/22 177 lb 6.4 oz (80.5 kg)  10/16/22 181 lb (82.1 kg)  08/28/22 184 lb 12.8 oz (83.8 kg)   Lab Results  Component Value Date   HGBA1C 8.8 (H) 08/26/2022   HGBA1C 6.5 08/15/2021   HGBA1C 6.4 08/20/2020   Lab Results  Component Value Date   MICROALBUR 1.4 08/26/2022   LDLCALC 69 08/15/2021   CREATININE 1.14 12/10/2022    Lab on 12/10/2022  Component Date Value Ref Range Status   Fructosamine 12/10/2022 269   0 - 285 umol/L Final   Comment: Published reference interval for apparently healthy subjects between age 37 and 2 is 25 - 285 umol/L and in a poorly controlled diabetic population is 228 - 563 umol/L with a mean of 396 umol/L.    Sodium 12/10/2022 140  135 - 145 mEq/L Final   Potassium 12/10/2022 4.4  3.5 - 5.1 mEq/L Final   Chloride 12/10/2022 104  96 - 112 mEq/L Final   CO2 12/10/2022 29  19 - 32 mEq/L Final   Glucose, Bld 12/10/2022 140 (H)  70 - 99 mg/dL Final   BUN 40/98/1191 13  6 - 23 mg/dL Final   Creatinine, Ser 12/10/2022 1.14  0.40 - 1.50 mg/dL Final   GFR 47/82/9562 61.73  >60.00 mL/min Final   Calculated using the CKD-EPI Creatinine Equation (2021)   Calcium 12/10/2022 9.7  8.4 - 10.5 mg/dL Final    Allergies as of 12/15/2022       Reactions   Penicillins Rash        Medication List        Accurate as of December 15, 2022  3:58 PM. If you have any questions, ask your nurse or doctor.          Accu-Chek FastClix Lancets  Misc Use Accu Chek fastclix to check blood sugar once daily. DX:E11.9   Accu-Chek Guide Me w/Device Kit 1 each by Does not apply route daily. Use to check blood sugar once daily. DX:E11.9   Accu-Chek Guide test strip Generic drug: glucose blood Use Accu Chek Guide test strips as instructed to check blood sugar once daily. DX:E11.9   aspirin 81 MG tablet Take 81 mg by mouth daily.   chlordiazePOXIDE 25 MG capsule Commonly known as: LIBRIUM Take 25 mg by mouth daily as needed.   diclofenac 50 MG EC tablet Commonly known as: VOLTAREN   empagliflozin 10 MG Tabs tablet Commonly known as: Jardiance Take 1 tablet (10 mg total) by mouth daily with breakfast.   esomeprazole 20 MG capsule Commonly known as: NEXIUM Take 20 mg by mouth 2 (two) times daily.   HYDROcodone-acetaminophen 5-325 MG tablet Commonly known as: NORCO/VICODIN Take 1 tablet by mouth every 6 (six) hours as needed for pain.   irbesartan-hydrochlorothiazide 150-12.5 MG  tablet Commonly known as: AVALIDE Take 1 tablet by mouth daily at 12 noon.   Janumet 50-1000 MG tablet Generic drug: sitaGLIPtin-metformin TAKE 1 TABLET BY MOUTH EVERY DAY   Lumigan 0.01 % Soln Generic drug: bimatoprost   meclizine 25 MG tablet Commonly known as: ANTIVERT Take 25 mg by mouth as needed.   metoprolol succinate 25 MG 24 hr tablet Commonly known as: TOPROL-XL Take 1 tablet (25 mg total) by mouth daily.   moxifloxacin 0.5 % ophthalmic solution Commonly known as: VIGAMOX Apply to eye.   potassium chloride 10 MEQ tablet Commonly known as: KLOR-CON M Take 10 mEq by mouth daily.   Prolensa 0.07 % Soln Generic drug: Bromfenac Sodium Apply to eye.   sertraline 100 MG tablet Commonly known as: ZOLOFT Take 100 mg by mouth daily.   sildenafil 100 MG tablet Commonly known as: VIAGRA Take 100 mg by mouth as needed for erectile dysfunction.   Zocor 40 MG tablet Generic drug: simvastatin 1 tablet every evening   simvastatin 20 MG tablet Commonly known as: ZOCOR TAKE 1 TABLET BY MOUTH EVERYDAY AT BEDTIME   Symbicort 160-4.5 MCG/ACT inhaler Generic drug: budesonide-formoterol INHALE 2 PUFFS INTO THE LUNGS EVERY DAY   tamsulosin 0.4 MG Caps capsule Commonly known as: FLOMAX Take 0.4 mg by mouth.   triamcinolone cream 0.1 % Commonly known as: KENALOG   triamcinolone ointment 0.1 % Commonly known as: KENALOG Apply 1 application topically 2 (two) times daily.   Ventolin HFA 108 (90 Base) MCG/ACT inhaler Generic drug: albuterol   zolpidem 10 MG tablet Commonly known as: AMBIEN Take 10 mg by mouth at bedtime as needed for sleep.   ZyrTEC Allergy 10 MG tablet Generic drug: cetirizine        Allergies:  Allergies  Allergen Reactions   Penicillins Rash    Past Medical History:  Diagnosis Date   Allergy    Anxiety    Arrhythmia    Asthma    Cataract    bilateral - just watching - no treatment yet   Chronic headaches    COPD (chronic  obstructive pulmonary disease) (HCC)    COPD (chronic obstructive pulmonary disease) (HCC)    Depression    Diabetes (HCC)    Diverticulosis    GERD (gastroesophageal reflux disease)    Glaucoma    Hyperlipidemia    Hypertension    IBS (irritable bowel syndrome)    Pneumonia    Sleep apnea    does not use CPAP  Past Surgical History:  Procedure Laterality Date   COLONOSCOPY     ESOPHAGOGASTRODUODENOSCOPY ENDOSCOPY     LUMBAR SPINE SURGERY  2010   ROTATOR CUFF REPAIR  2008   urology surgery      Family History  Problem Relation Age of Onset   Arthritis Mother    Hypertension Mother    Heart failure Father    Diabetes Other    Colon cancer Neg Hx    Colon polyps Neg Hx    Stomach cancer Neg Hx    Rectal cancer Neg Hx     Social History:  reports that he has never smoked. He has never used smokeless tobacco. He reports that he does not currently use alcohol after a past usage of about 2.0 standard drinks of alcohol per week. He reports that he does not use drugs.  Review of Systems:  HYPERTENSION:  well controlled with Avalide, also followed by PCP regularly On Toprol 25 mg from cardiologist  BP Readings from Last 3 Encounters:  12/15/22 138/80  08/28/22 128/76  03/26/22 124/72    HYPERLIPIDEMIA: The lipid abnormality consists of elevated LDL and low HDL treated by his PCP with simvastatin 40mg  long-term LDL is consistently controlled, nonfasting triglycerides are high   Lab Results  Component Value Date   CHOL 141 08/26/2022   HDL 36.40 (L) 08/26/2022   LDLCALC 69 08/15/2021   LDLDIRECT 80.0 08/26/2022   TRIG 217.0 (H) 08/26/2022   CHOLHDL 4 08/26/2022    Diabetic foot exam 10/22, also followed by podiatrist 12/23 Last eye exam April 2023 without retinopathy    Examination:   BP 138/80   Pulse 77   Ht 5' 10.5" (1.791 m)   Wt 177 lb 6.4 oz (80.5 kg)   SpO2 97%   BMI 25.09 kg/m   Body mass index is 25.09 kg/m.    ASSESSMENT/ PLAN:    Diabetes type 2  See history of present illness for  discussion of current diabetes management, blood sugar patterns and problems identified  A1c is 6.3 compared to 8.8  He has been treated with Janumet 50/1000, 1 tablet daily  His blood sugars are significantly better with changing his lifestyle significantly especially with regular exercise program, good diet This is with with only 1 tablet of Janumet that was continued on the last visit Blood sugars are fairly good although relatively higher in the morning Also weight is slightly better  Recommendations: He will change his Janumet to the evening Also started to check more readings after dinner and alternate fasting and after meals Stay on consistent diet and exercise program Follow-up in 6 months unless having high sugars To make sure he monitors his blood sugars consistently    Reather Littler 12/15/2022, 3:58 PM

## 2022-12-17 ENCOUNTER — Encounter: Payer: Self-pay | Admitting: Podiatry

## 2022-12-17 ENCOUNTER — Ambulatory Visit (INDEPENDENT_AMBULATORY_CARE_PROVIDER_SITE_OTHER): Payer: Medicare Other | Admitting: Podiatry

## 2022-12-17 DIAGNOSIS — B351 Tinea unguium: Secondary | ICD-10-CM

## 2022-12-17 DIAGNOSIS — M79674 Pain in right toe(s): Secondary | ICD-10-CM | POA: Diagnosis not present

## 2022-12-17 DIAGNOSIS — E119 Type 2 diabetes mellitus without complications: Secondary | ICD-10-CM

## 2022-12-17 DIAGNOSIS — M79675 Pain in left toe(s): Secondary | ICD-10-CM

## 2022-12-17 NOTE — Progress Notes (Signed)
This patient returns to my office for at risk foot care.  This patient requires this care by a professional since this patient will be at risk due to having diabetes.  This patient is unable to cut nails himself since the patient cannot reach his nails.These nails are painful walking and wearing shoes.  This patient presents for at risk foot care today.  General Appearance  Alert, conversant and in no acute stress.  Vascular  Dorsalis pedis and posterior tibial  pulses are palpable  bilaterally.  Capillary return is within normal limits  bilaterally. Temperature is within normal limits  bilaterally.  Neurologic  Senn-Weinstein monofilament wire test within normal limits  bilaterally. Muscle power within normal limits bilaterally.  Nails Thick disfigured discolored nails with subungual debris  from second  to fifth toes bilaterally. No evidence of bacterial infection or drainage bilaterally.  Orthopedic  No limitations of motion  feet .  No crepitus or effusions noted.  No bony pathology or digital deformities noted.  Skin  normotropic skin with no porokeratosis noted bilaterally.  No signs of infections or ulcers noted.     Onychomycosis  Pain in right toes  Pain in left toes  Consent was obtained for treatment procedures.   Mechanical debridement of nails 2-5  bilaterally performed with a nail nipper.  Filed with dremel without incident.    Return office visit    3 months.                 Told patient to return for periodic foot care and evaluation due to potential at risk complications.   Caylan Schifano DPM  

## 2022-12-18 ENCOUNTER — Ambulatory Visit: Payer: Medicare Other | Admitting: Podiatry

## 2023-01-13 ENCOUNTER — Other Ambulatory Visit: Payer: Self-pay | Admitting: Endocrinology

## 2023-03-19 ENCOUNTER — Ambulatory Visit (INDEPENDENT_AMBULATORY_CARE_PROVIDER_SITE_OTHER): Payer: Medicare Other | Admitting: Podiatry

## 2023-03-19 ENCOUNTER — Encounter: Payer: Self-pay | Admitting: Podiatry

## 2023-03-19 DIAGNOSIS — E119 Type 2 diabetes mellitus without complications: Secondary | ICD-10-CM | POA: Diagnosis not present

## 2023-03-19 DIAGNOSIS — M79674 Pain in right toe(s): Secondary | ICD-10-CM

## 2023-03-19 DIAGNOSIS — M79675 Pain in left toe(s): Secondary | ICD-10-CM | POA: Diagnosis not present

## 2023-03-19 DIAGNOSIS — B351 Tinea unguium: Secondary | ICD-10-CM

## 2023-03-19 NOTE — Progress Notes (Signed)
This patient returns to my office for at risk foot care.  This patient requires this care by a professional since this patient will be at risk due to having diabetes.  This patient is unable to cut nails himself since the patient cannot reach his nails.These nails are painful walking and wearing shoes.  This patient presents for at risk foot care today.  General Appearance  Alert, conversant and in no acute stress.  Vascular  Dorsalis pedis and posterior tibial  pulses are palpable  bilaterally.  Capillary return is within normal limits  bilaterally. Temperature is within normal limits  bilaterally.  Neurologic  Senn-Weinstein monofilament wire test within normal limits  bilaterally. Muscle power within normal limits bilaterally.  Nails Thick disfigured discolored nails with subungual debris  from second  to fifth toes bilaterally. No evidence of bacterial infection or drainage bilaterally.  Orthopedic  No limitations of motion  feet .  No crepitus or effusions noted.  No bony pathology or digital deformities noted.  Skin  normotropic skin with no porokeratosis noted bilaterally.  No signs of infections or ulcers noted.     Onychomycosis  Pain in right toes  Pain in left toes  Consent was obtained for treatment procedures.   Mechanical debridement of nails 2-5  bilaterally performed with a nail nipper.  Filed with dremel without incident.    Return office visit    3 months.                 Told patient to return for periodic foot care and evaluation due to potential at risk complications.   Gregory Mayer DPM  

## 2023-04-22 ENCOUNTER — Ambulatory Visit: Payer: Medicare Other | Admitting: Endocrinology

## 2023-05-12 ENCOUNTER — Encounter: Payer: Self-pay | Admitting: Endocrinology

## 2023-05-12 ENCOUNTER — Other Ambulatory Visit: Payer: Self-pay

## 2023-05-12 DIAGNOSIS — E1165 Type 2 diabetes mellitus with hyperglycemia: Secondary | ICD-10-CM

## 2023-05-12 DIAGNOSIS — E119 Type 2 diabetes mellitus without complications: Secondary | ICD-10-CM

## 2023-05-12 MED ORDER — JANUMET 50-1000 MG PO TABS: 1.0000 | ORAL_TABLET | Freq: Every day | ORAL | 1 refills | Status: DC

## 2023-05-12 NOTE — Telephone Encounter (Signed)
Spoke to patient he stated he sent email to wrong Dr.He will send to his PCP.

## 2023-05-13 ENCOUNTER — Other Ambulatory Visit: Payer: Self-pay

## 2023-05-13 NOTE — Telephone Encounter (Signed)
Patient called stating pharmacy would not allow him to refill Janumet nor pay for 10 pills needed until next refill. RX sent over to pharmacy on 10/29. Attempted member today for clarity. No answer VM left for return call.

## 2023-05-15 ENCOUNTER — Ambulatory Visit: Payer: Medicare Other | Admitting: Endocrinology

## 2023-06-18 ENCOUNTER — Ambulatory Visit: Payer: Medicare Other | Admitting: Podiatry

## 2023-06-23 ENCOUNTER — Encounter: Payer: Self-pay | Admitting: Podiatry

## 2023-06-23 ENCOUNTER — Ambulatory Visit (INDEPENDENT_AMBULATORY_CARE_PROVIDER_SITE_OTHER): Payer: Medicare Other | Admitting: Podiatry

## 2023-06-23 DIAGNOSIS — M79675 Pain in left toe(s): Secondary | ICD-10-CM

## 2023-06-23 DIAGNOSIS — E119 Type 2 diabetes mellitus without complications: Secondary | ICD-10-CM

## 2023-06-23 DIAGNOSIS — M79674 Pain in right toe(s): Secondary | ICD-10-CM

## 2023-06-23 DIAGNOSIS — B351 Tinea unguium: Secondary | ICD-10-CM

## 2023-06-23 NOTE — Progress Notes (Signed)
This patient returns to my office for at risk foot care.  This patient requires this care by a professional since this patient will be at risk due to having diabetes.  This patient is unable to cut nails himself since the patient cannot reach his nails.These nails are painful walking and wearing shoes.  This patient presents for at risk foot care today.  General Appearance  Alert, conversant and in no acute stress.  Vascular  Dorsalis pedis and posterior tibial  pulses are palpable  bilaterally.  Capillary return is within normal limits  bilaterally. Temperature is within normal limits  bilaterally.  Neurologic  Senn-Weinstein monofilament wire test within normal limits  bilaterally. Muscle power within normal limits bilaterally.  Nails Thick disfigured discolored nails with subungual debris  from second  to fifth toes bilaterally. No evidence of bacterial infection or drainage bilaterally.  Orthopedic  No limitations of motion  feet .  No crepitus or effusions noted.  No bony pathology or digital deformities noted.  Skin  normotropic skin with no porokeratosis noted bilaterally.  No signs of infections or ulcers noted.     Onychomycosis  Pain in right toes  Pain in left toes  Consent was obtained for treatment procedures.   Mechanical debridement of nails 2-5  bilaterally performed with a nail nipper.  Filed with dremel without incident.    Return office visit    3 months.                 Told patient to return for periodic foot care and evaluation due to potential at risk complications.   Keenan Dimitrov DPM  

## 2023-06-26 ENCOUNTER — Ambulatory Visit: Payer: Medicare Other | Attending: Cardiovascular Disease | Admitting: Cardiovascular Disease

## 2023-06-26 ENCOUNTER — Encounter: Payer: Self-pay | Admitting: Cardiovascular Disease

## 2023-06-26 VITALS — BP 118/66 | HR 83 | Ht 71.0 in | Wt 179.4 lb

## 2023-06-26 DIAGNOSIS — E119 Type 2 diabetes mellitus without complications: Secondary | ICD-10-CM

## 2023-06-26 DIAGNOSIS — I10 Essential (primary) hypertension: Secondary | ICD-10-CM

## 2023-06-26 DIAGNOSIS — E785 Hyperlipidemia, unspecified: Secondary | ICD-10-CM

## 2023-06-26 NOTE — Patient Instructions (Signed)

## 2023-06-26 NOTE — Progress Notes (Signed)
Cardiology Office Note   Date:  06/26/2023   ID:  Derek [name], DOB 06-Mar-1946, MRN [id number]  PCP:  Corine Shelter, MD  Cardiologist:  Thurmon Fair, MD  Electrophysiologist:  None   Evaluation Performed:  Follow-Up Visit  Chief Complaint:  HTN, HLP  History of Present Illness:    Derek [name] is a 76 y.o. male with type 2 diabetes mellitus, hypertension and dyslipidemia.  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  He has no cardiovascular complaints.   The patient specifically denies any chest pain at rest or with exertion, dyspnea at rest or with exertion, orthopnea, paroxysmal nocturnal dyspnea, syncope, palpitations, focal neurological deficits, intermittent claudication, lower extremity edema, unexplained weight gain, cough, hemoptysis or wheezing.  Earlier this year he became more lax with his diet and activity level.  His hemoglobin A1c increased to 8.8% and his lipid profile deteriorated.  After meeting with his new endocrinologist he has been doing a better job with dietary control and exercise and his hemoglobin A1c has decreased back down to 6.3%.  He has not yet had a repeat lipid profile.  His blood pressure remains very well-controlled.   Past Medical History:  Diagnosis Date   Allergy    Anxiety    Arrhythmia    Asthma    Cataract    bilateral - just watching - no treatment yet   Chronic headaches    COPD (chronic obstructive pulmonary disease) (HCC)    COPD (chronic obstructive pulmonary disease) (HCC)    Depression    Diabetes (HCC)    Diverticulosis    GERD (gastroesophageal reflux disease)    Glaucoma    Hyperlipidemia    Hypertension    IBS (irritable bowel syndrome)    Pneumonia    Sleep apnea    does not use CPAP   Past Surgical History:  Procedure Laterality Date   COLONOSCOPY     ESOPHAGOGASTRODUODENOSCOPY ENDOSCOPY     LUMBAR SPINE SURGERY  2010   ROTATOR CUFF REPAIR  2008    urology surgery       Current Meds  Medication Sig   Accu-Chek FastClix Lancets MISC Use Accu Chek fastclix to check blood sugar once daily. DX:E11.9   aspirin 81 MG tablet Take 81 mg by mouth daily.   Blood Glucose Monitoring Suppl (ACCU-CHEK GUIDE ME) w/Device KIT 1 each by Does not apply route daily. Use to check blood sugar once daily. DX:E11.9   chlordiazePOXIDE (LIBRIUM) 25 MG capsule Take 25 mg by mouth daily as needed.   doxycycline (VIBRA-TABS) 100 MG tablet Take 100 mg by mouth 2 (two) times daily.   esomeprazole (NEXIUM) 20 MG capsule Take 20 mg by mouth 2 (two) times daily.   glucose blood (ACCU-CHEK GUIDE) test strip Use Accu Chek Guide test strips as instructed to check blood sugar once daily. DX:E11.9   HYDROcodone-acetaminophen (NORCO/VICODIN) 5-325 MG per tablet Take 1 tablet by mouth every 6 (six) hours as needed for pain.   irbesartan-hydrochlorothiazide (AVALIDE) 150-12.5 MG per tablet Take 1 tablet by mouth daily at 12 noon.    LUMIGAN 0.01 % SOLN    meclizine (ANTIVERT) 25 MG tablet Take 25 mg by mouth as needed.   metoprolol succinate (TOPROL-XL) 25 MG 24 hr tablet Take 1 tablet (25 mg total) by mouth daily.   potassium chloride (K-DUR,KLOR-CON) 10 MEQ tablet Take 10 mEq by mouth daily.  PROLENSA 0.07 % SOLN Apply to eye.   sildenafil (VIAGRA) 100 MG tablet Take 100 mg by mouth as needed for erectile dysfunction.   simvastatin (ZOCOR) 20 MG tablet TAKE 1 TABLET BY MOUTH EVERYDAY AT BEDTIME   sitaGLIPtin-metformin (JANUMET) 50-1000 MG tablet Take 1 tablet by mouth daily.   SYMBICORT 160-4.5 MCG/ACT inhaler INHALE 2 PUFFS INTO THE LUNGS EVERY DAY   tamsulosin (FLOMAX) 0.4 MG CAPS capsule Take 0.4 mg by mouth daily.   triamcinolone cream (KENALOG) 0.1 %    triamcinolone ointment (KENALOG) 0.1 % Apply 1 application topically 2 (two) times daily.   VENTOLIN HFA 108 (90 BASE) MCG/ACT inhaler    zolpidem (AMBIEN) 10 MG tablet Take 10 mg by mouth at bedtime as needed  for sleep.     Allergies:   Penicillins   Social History   Tobacco Use   Smoking status: Never   Smokeless tobacco: Never  Vaping Use   Vaping status: Never Used  Substance Use Topics   Alcohol use: Not Currently    Alcohol/week: 2.0 standard drinks of alcohol    Types: 2 Shots of liquor per week   Drug use: No     Family Hx: The patient's family history includes Arthritis in his mother; Diabetes in an other family member; Heart failure in his father; Hypertension in his mother. There is no history of Colon cancer, Colon polyps, Stomach cancer, or Rectal cancer.  ROS:   Please see the history of present illness.     All other systems reviewed and are negative.   Prior CV studies:   The following studies were reviewed today: Labs from Dr. Lucianne Muss  Labs/Other Tests and Data Reviewed:    EKG: performed today, shows NSR, normal tracing  Recent Labs: 08/26/2022: ALT 23 12/10/2022: BUN 13; Creatinine, Ser 1.14; Potassium 4.4; Sodium 140   Recent Lipid Panel Lab Results  Component Value Date/Time   CHOL 141 08/26/2022 03:04 PM   TRIG 217.0 (H) 08/26/2022 03:04 PM   HDL 36.40 (L) 08/26/2022 03:04 PM   CHOLHDL 4 08/26/2022 03:04 PM   LDLCALC 69 08/15/2021 03:02 PM   LDLDIRECT 80.0 08/26/2022 03:04 PM    Wt Readings from Last 3 Encounters:  06/26/23 179 lb 6.4 oz (81.4 kg)  12/15/22 177 lb 6.4 oz (80.5 kg)  10/16/22 181 lb (82.1 kg)     Objective:    Vital Signs:  BP 118/66 (BP Location: Left Arm, Patient Position: Sitting, Cuff Size: Normal)   Pulse 83   Ht 5\' 11"  (1.803 m)   Wt 179 lb 6.4 oz (81.4 kg)   SpO2 96%   BMI 25.02 kg/m      General: Alert, oriented x3, no distress, appears fit, looks younger than stated age Head: no evidence of trauma, PERRL, EOMI, no exophtalmos or lid lag, no myxedema, no xanthelasma; normal ears, nose and oropharynx Neck: normal jugular venous pulsations and no hepatojugular reflux; brisk carotid pulses without delay and no  carotid bruits Chest: clear to auscultation, no signs of consolidation by percussion or palpation, normal fremitus, symmetrical and full respiratory excursions Cardiovascular: normal position and quality of the apical impulse, regular rhythm, normal first and second heart sounds, no murmurs, rubs or gallops Abdomen: no tenderness or distention, no masses by palpation, no abnormal pulsatility or arterial bruits, normal bowel sounds, no hepatosplenomegaly Extremities: no clubbing, cyanosis or edema; 2+ radial, ulnar and brachial pulses bilaterally; 2+ right femoral, posterior tibial and dorsalis pedis pulses; 2+ left femoral, posterior tibial  and dorsalis pedis pulses; no subclavian or femoral bruits Neurological: grossly nonfocal Psych: Normal mood and affect    ASSESSMENT & PLAN:    1. Essential hypertension   2. Type 2 diabetes mellitus without complication, without long-term current use of insulin (HCC)   3. Dyslipidemia (high LDL; low HDL)      HTN: Well-controlled DM: Deteriorated in February, but has subsequently improved with a most recent A1c level of 6.3% HLP: All his lipid parameters looked worse and February with an LDL cholesterol of 80, HDL 36, mildly elevated triglycerides to 117.  These have not been repeated since the improvement in hemoglobin A1c.  He will have labs again in January when he sees his endocrinologist.  Encouraged him to remain physically active, avoid sweets and starches with high glycemic index, prefer lean protein, non-starchy vegetables and starchy foods that are made with whole grains instead.  Continue statin.   Patient Instructions  Medication Instructions:  No changes *If you need a refill on your cardiac medications before your next appointment, please call your pharmacy*  Follow-Up: At [facility], you and your health needs are our priority.  As part of our continuing mission to provide you with exceptional heart care, we have created  designated Provider Care Teams.  These Care Teams include your primary Cardiologist (physician) and Advanced Practice Providers (APPs -  Physician Assistants and Nurse Practitioners) who all work together to provide you with the care you need, when you need it.  We recommend signing up for the patient portal called "MyChart".  Sign up information is provided on this After Visit Summary.  MyChart is used to connect with patients for Virtual Visits (Telemedicine).  Patients are able to view lab/test results, encounter notes, upcoming appointments, etc.  Non-urgent messages can be sent to your provider as well.   To learn more about what you can do with MyChart, go to [email or web address].    Your next appointment:   1 year(s)  Provider:   Thurmon Fair, MD       Tests Ordered: Orders Placed This Encounter  Procedures   EKG 12-Lead    Medication Changes: No orders of the defined types were placed in this encounter.   Follow Up:  In Person  1 year  Signed, Thurmon Fair, MD  06/26/2023 8:33 PM    [facility]

## 2023-07-21 ENCOUNTER — Ambulatory Visit (INDEPENDENT_AMBULATORY_CARE_PROVIDER_SITE_OTHER): Payer: Medicare Other | Admitting: Endocrinology

## 2023-07-21 ENCOUNTER — Encounter: Payer: Self-pay | Admitting: Endocrinology

## 2023-07-21 VITALS — BP 136/86 | HR 76 | Resp 20 | Ht 71.0 in | Wt 176.0 lb

## 2023-07-21 DIAGNOSIS — Z7984 Long term (current) use of oral hypoglycemic drugs: Secondary | ICD-10-CM | POA: Diagnosis not present

## 2023-07-21 DIAGNOSIS — E118 Type 2 diabetes mellitus with unspecified complications: Secondary | ICD-10-CM

## 2023-07-21 DIAGNOSIS — E1142 Type 2 diabetes mellitus with diabetic polyneuropathy: Secondary | ICD-10-CM

## 2023-07-21 DIAGNOSIS — E119 Type 2 diabetes mellitus without complications: Secondary | ICD-10-CM

## 2023-07-21 LAB — POCT GLYCOSYLATED HEMOGLOBIN (HGB A1C): Hemoglobin A1C: 7 % — AB (ref 4.0–5.6)

## 2023-07-21 MED ORDER — JANUMET 50-1000 MG PO TABS: 1.0000 | ORAL_TABLET | Freq: Every day | ORAL | 3 refills | Status: DC

## 2023-07-21 NOTE — Progress Notes (Signed)
**Note Derek-Identified via Obfuscation**  Outpatient Endocrinology Note Derek Krinke, MD   Patient's Name: Derek [name]    DOB: [birth year]    MRN: [id number]                                                    REASON OF VISIT: Follow up for type 2 diabetes mellitus  PCP: Hillman Bare, MD  HISTORY OF PRESENT ILLNESS:   Derek [name] is a 78 y.o. old male with past medical history listed below, is here for follow up for type 2 diabetes mellitus.   Pertinent Diabetes History: Patient was previously seen by Dr. Von and was last time seen in June 2024.  Patient was diagnosed with type 2 diabetes mellitus in 2008.  He was initially treated with metformin  and Januvia  was added in 2011.  He has a relatively controlled type 2 diabetes mellitus.  Chronic Diabetes Complications : Retinopathy: no. Last ophthalmology exam was done on annually, following with ophthalmology regularly.  Nephropathy: no Peripheral neuropathy: yes, following with podiatry. Coronary artery disease: no Stroke: no  Relevant comorbidities and cardiovascular risk factors: Obesity: no Body mass index is 24.55 kg/m.  Hypertension: Yes  Hyperlipidemia : Yes, on statin   Current / Home Diabetic regimen includes:  Janumet  50/1000 mg 1 tablet daily.  Prior diabetic medications:  Glycemic data:   He forgot to bring glucometer.  He reports taking usually 2 times a day in the morning fasting and bedtime.  Blood sugar mostly in the range of 130-135 range.  Hypoglycemia: Patient has no hypoglycemic episodes. Patient has hypoglycemia awareness.  Factors modifying glucose control: 1.  Diabetic diet assessment: 2-3 meals a day.   2.  Staying active or exercising: walking and going to gym.   3.  Medication compliance: compliant all of the time.  Interval history  Hemoglobin A1c today 7.0%.  Reports compliance with Janumet .  Denies any GI issues.  Denies numbness and tingling of the feet.  No vision problem.  No other complaints today.  REVIEW OF  SYSTEMS As per history of present illness.   PAST MEDICAL HISTORY: Past Medical History:  Diagnosis Date   Allergy    Anxiety    Arrhythmia    Asthma    Cataract    bilateral - just watching - no treatment yet   Chronic headaches    COPD (chronic obstructive pulmonary disease) (HCC)    COPD (chronic obstructive pulmonary disease) (HCC)    Depression    Diabetes (HCC)    Diverticulosis    GERD (gastroesophageal reflux disease)    Glaucoma    Hyperlipidemia    Hypertension    IBS (irritable bowel syndrome)    Pneumonia    Sleep apnea    does not use CPAP    PAST SURGICAL HISTORY: Past Surgical History:  Procedure Laterality Date   COLONOSCOPY     ESOPHAGOGASTRODUODENOSCOPY ENDOSCOPY     LUMBAR SPINE SURGERY  2010   ROTATOR CUFF REPAIR  2008   urology surgery      ALLERGIES: Allergies  Allergen Reactions   Penicillins Rash    FAMILY HISTORY:  Family History  Problem Relation Age of Onset   Arthritis Mother    Hypertension Mother    Heart failure Father    Diabetes Other    Colon cancer Neg Hx  Colon polyps Neg Hx    Stomach cancer Neg Hx    Rectal cancer Neg Hx     SOCIAL HISTORY: Social History   Socioeconomic History   Marital status: Married    Spouse name: Not on file   Number of children: 2   Years of education: Not on file   Highest education level: Not on file  Occupational History   Occupation: Retired  Tobacco Use   Smoking status: Never   Smokeless tobacco: Never  Vaping Use   Vaping status: Never Used  Substance and Sexual Activity   Alcohol use: Not Currently    Alcohol/week: 2.0 standard drinks of alcohol    Types: 2 Shots of liquor per week   Drug use: No   Sexual activity: Not on file  Other Topics Concern   Not on file  Social History Narrative   Not on file   Social Drivers of Health   Financial Resource Strain: Not on file  Food Insecurity: Not on file  Transportation Needs: Not on file  Physical Activity: Not  on file  Stress: Not on file  Social Connections: Not on file    MEDICATIONS:  Current Outpatient Medications  Medication Sig Dispense Refill   Accu-Chek FastClix Lancets MISC Use Accu Chek fastclix to check blood sugar once daily. DX:E11.9 100 each 2   aspirin 81 MG tablet Take 81 mg by mouth daily.     Blood Glucose Monitoring Suppl (ACCU-CHEK GUIDE ME) w/Device KIT 1 each by Does not apply route daily. Use to check blood sugar once daily. DX:E11.9 1 kit 0   cetirizine (ZYRTEC ALLERGY) 10 MG tablet      chlordiazePOXIDE (LIBRIUM) 25 MG capsule Take 25 mg by mouth daily as needed.  1   doxycycline  (VIBRA -TABS) 100 MG tablet Take 100 mg by mouth 2 (two) times daily.     esomeprazole  (NEXIUM ) 20 MG capsule Take 20 mg by mouth 2 (two) times daily.  4   glucose blood (ACCU-CHEK GUIDE) test strip Use Accu Chek Guide test strips as instructed to check blood sugar once daily. DX:E11.9 100 each 2   HYDROcodone-acetaminophen (NORCO/VICODIN) 5-325 MG per tablet Take 1 tablet by mouth every 6 (six) hours as needed for pain.     irbesartan-hydrochlorothiazide (AVALIDE) 150-12.5 MG per tablet Take 1 tablet by mouth daily at 12 noon.      LUMIGAN 0.01 % SOLN   11   meclizine (ANTIVERT) 25 MG tablet Take 25 mg by mouth as needed.  4   metoprolol  succinate (TOPROL -XL) 25 MG 24 hr tablet Take 1 tablet (25 mg total) by mouth daily. 90 tablet 3   potassium chloride (K-DUR,KLOR-CON) 10 MEQ tablet Take 10 mEq by mouth daily.      PROLENSA 0.07 % SOLN Apply to eye.     sildenafil (VIAGRA) 100 MG tablet Take 100 mg by mouth as needed for erectile dysfunction.     simvastatin (ZOCOR) 20 MG tablet TAKE 1 TABLET BY MOUTH EVERYDAY AT BEDTIME  3   SYMBICORT 160-4.5 MCG/ACT inhaler INHALE 2 PUFFS INTO THE LUNGS EVERY DAY  4   tamsulosin (FLOMAX) 0.4 MG CAPS capsule Take 0.4 mg by mouth daily.     triamcinolone  cream (KENALOG ) 0.1 %      triamcinolone  ointment (KENALOG ) 0.1 % Apply 1 application topically 2 (two)  times daily. 30 g 0   VENTOLIN HFA 108 (90 BASE) MCG/ACT inhaler   4   zolpidem (AMBIEN) 10 MG tablet Take  10 mg by mouth at bedtime as needed for sleep.     sitaGLIPtin -metformin  (JANUMET ) 50-1000 MG tablet Take 1 tablet by mouth daily. 90 tablet 3   No current facility-administered medications for this visit.    PHYSICAL EXAM: Vitals:   07/21/23 1520  BP: 136/86  Pulse: 76  Resp: 20  SpO2: 97%  Weight: 176 lb (79.8 kg)  Height: 5' 11 (1.803 m)   Body mass index is 24.55 kg/m.  Wt Readings from Last 3 Encounters:  07/21/23 176 lb (79.8 kg)  06/26/23 179 lb 6.4 oz (81.4 kg)  12/15/22 177 lb 6.4 oz (80.5 kg)    General: Well developed, well nourished male in no apparent distress.  HEENT: AT/[place], no external lesions.  Eyes: Conjunctiva clear and no icterus. Neck: Neck supple  Lungs: Respirations not labored Neurologic: Alert, oriented, normal speech Extremities / Skin: Dry. No sores or rashes noted. No acanthosis nigricans Psychiatric: Does not appear depressed or anxious  Diabetic Foot Exam - Simple   Simple Foot Form Diabetic Foot exam was performed with the following findings: Yes 07/21/2023  3:36 PM  Visual Inspection See comments: Yes Sensation Testing See comments: Yes Pulse Check See comments: Yes Comments Monofilament mildly diminished on left, right intact.  DP 2+ bilaterally.  Dystrophic nails +    LABS Reviewed Lab Results  Component Value Date   HGBA1C 7.0 (A) 07/21/2023   HGBA1C 6.3 (A) 12/15/2022   HGBA1C 8.8 (H) 08/26/2022   Lab Results  Component Value Date   FRUCTOSAMINE 269 12/10/2022   Lab Results  Component Value Date   CHOL 141 08/26/2022   HDL 36.40 (L) 08/26/2022   LDLCALC 69 08/15/2021   LDLDIRECT 80.0 08/26/2022   TRIG 217.0 (H) 08/26/2022   CHOLHDL 4 08/26/2022   Lab Results  Component Value Date   MICRALBCREAT 1.5 08/26/2022   MICRALBCREAT 1.2 08/15/2021   Lab Results  Component Value Date   CREATININE 1.14  12/10/2022   Lab Results  Component Value Date   GFR 61.73 12/10/2022    ASSESSMENT / PLAN  1. Controlled type 2 diabetes mellitus with complication, without long-term current use of insulin (HCC)   2. Diabetes mellitus, stable (HCC)     Diabetes Mellitus type 2, complicated by peripheral neuropathy. - Diabetic status / severity: Fair control.  Lab Results  Component Value Date   HGBA1C 7.0 (A) 07/21/2023    - Hemoglobin A1c goal : <7%  - Medications: No change  I) Janumet  50/1000 mg 1 tablet daily.  - Home glucose testing: In the morning fasting and asked to bring the glucometer in the follow-up visit. - Discussed/ Gave Hypoglycemia treatment plan.  # Consult : not required at this time.   # Annual urine for microalbuminuria/ creatinine ratio, no microalbuminuria currently, will check today.  Last  Lab Results  Component Value Date   MICRALBCREAT 1.5 08/26/2022    # Foot check nightly / neuropathy, follow-up with podiatry.  # Annual dilated diabetic eye exams.   - Diet: Make healthy diabetic food choices - Life style / activity / exercise: Discussed.  2. Blood pressure  -  BP Readings from Last 1 Encounters:  07/21/23 136/86    - Control is in target.  - No change in current plans.  3. Lipid status / Hyperlipidemia - Last  Lab Results  Component Value Date   LDLCALC 69 08/15/2021   - Continue simvastatin 40 mg daily.  Managed by primary care provider.  Diagnoses and  all orders for this visit:  Controlled type 2 diabetes mellitus with complication, without long-term current use of insulin (HCC) -     POCT glycosylated hemoglobin (Hb A1C) -     sitaGLIPtin -metformin  (JANUMET ) 50-1000 MG tablet; Take 1 tablet by mouth daily. -     BASIC METABOLIC PANEL WITH GFR -     Microalbumin / creatinine urine ratio  Diabetes mellitus, stable (HCC) -     sitaGLIPtin -metformin  (JANUMET ) 50-1000 MG tablet; Take 1 tablet by mouth daily.    DISPOSITION Follow  up in clinic in 5-6  months suggested.   All questions answered and patient verbalized understanding of the plan.  Derek Waddell, MD [facility] [address] [place], [address] [place], [place] [address] Phone # [phone]  At least part of this note was generated using voice recognition software. Inadvertent word errors may have occurred, which were not recognized during the proofreading process.

## 2023-07-22 LAB — MICROALBUMIN / CREATININE URINE RATIO
Creatinine, Urine: 74 mg/dL (ref 20–320)
Microalb Creat Ratio: 7 mg/g{creat} (ref <30)
Microalb, Ur: 0.5 mg/dL

## 2023-07-22 LAB — BASIC METABOLIC PANEL WITH GFR
BUN: 15 mg/dL (ref 7–25)
CO2: 30 mmol/L (ref 20–32)
Calcium: 9.6 mg/dL (ref 8.6–10.3)
Chloride: 104 mmol/L (ref 98–110)
Creat: 0.94 mg/dL (ref 0.70–1.28)
Glucose, Bld: 101 mg/dL — ABNORMAL HIGH (ref 65–99)
Potassium: 4.4 mmol/L (ref 3.5–5.3)
Sodium: 141 mmol/L (ref 135–146)
eGFR: 83 mL/min/{1.73_m2} (ref >=60)

## 2023-09-22 ENCOUNTER — Ambulatory Visit (INDEPENDENT_AMBULATORY_CARE_PROVIDER_SITE_OTHER): Payer: Medicare Other | Admitting: Podiatry

## 2023-09-22 ENCOUNTER — Encounter: Payer: Self-pay | Admitting: Podiatry

## 2023-09-22 DIAGNOSIS — M79674 Pain in right toe(s): Secondary | ICD-10-CM

## 2023-09-22 DIAGNOSIS — M79675 Pain in left toe(s): Secondary | ICD-10-CM

## 2023-09-22 DIAGNOSIS — B351 Tinea unguium: Secondary | ICD-10-CM

## 2023-09-22 DIAGNOSIS — E119 Type 2 diabetes mellitus without complications: Secondary | ICD-10-CM

## 2023-09-22 NOTE — Progress Notes (Signed)
This patient returns to my office for at risk foot care.  This patient requires this care by a professional since this patient will be at risk due to having diabetes.  This patient is unable to cut nails himself since the patient cannot reach his nails.These nails are painful walking and wearing shoes.  This patient presents for at risk foot care today.  General Appearance  Alert, conversant and in no acute stress.  Vascular  Dorsalis pedis and posterior tibial  pulses are palpable  bilaterally.  Capillary return is within normal limits  bilaterally. Temperature is within normal limits  bilaterally.  Neurologic  Senn-Weinstein monofilament wire test within normal limits  bilaterally. Muscle power within normal limits bilaterally.  Nails Thick disfigured discolored nails with subungual debris  from second  to fifth toes bilaterally. No evidence of bacterial infection or drainage bilaterally.  Orthopedic  No limitations of motion  feet .  No crepitus or effusions noted.  No bony pathology or digital deformities noted.  Skin  normotropic skin with no porokeratosis noted bilaterally.  No signs of infections or ulcers noted.     Onychomycosis  Pain in right toes  Pain in left toes  Consent was obtained for treatment procedures.   Mechanical debridement of nails 2-5  bilaterally performed with a nail nipper.  Filed with dremel without incident.    Return office visit    3 months.                 Told patient to return for periodic foot care and evaluation due to potential at risk complications.   Keenan Dimitrov DPM  

## 2023-10-21 LAB — HM DIABETES EYE EXAM

## 2023-10-22 ENCOUNTER — Encounter: Payer: Self-pay | Admitting: Endocrinology

## 2023-12-21 ENCOUNTER — Encounter: Payer: Self-pay | Admitting: Endocrinology

## 2023-12-21 ENCOUNTER — Ambulatory Visit: Payer: Self-pay | Admitting: Endocrinology

## 2023-12-21 ENCOUNTER — Ambulatory Visit (INDEPENDENT_AMBULATORY_CARE_PROVIDER_SITE_OTHER): Payer: Medicare Other | Admitting: Endocrinology

## 2023-12-21 VITALS — BP 126/80 | HR 86 | Resp 16 | Ht 71.0 in | Wt 177.0 lb

## 2023-12-21 DIAGNOSIS — I10 Essential (primary) hypertension: Secondary | ICD-10-CM | POA: Diagnosis not present

## 2023-12-21 DIAGNOSIS — E118 Type 2 diabetes mellitus with unspecified complications: Secondary | ICD-10-CM | POA: Diagnosis not present

## 2023-12-21 DIAGNOSIS — E119 Type 2 diabetes mellitus without complications: Secondary | ICD-10-CM

## 2023-12-21 LAB — POCT GLYCOSYLATED HEMOGLOBIN (HGB A1C): Hemoglobin A1C: 6.6 % — AB (ref 4.0–5.6)

## 2023-12-21 MED ORDER — JANUMET 50-1000 MG PO TABS: 1.0000 | ORAL_TABLET | Freq: Every day | ORAL | 3 refills | Status: AC

## 2023-12-21 NOTE — Patient Instructions (Signed)
 Latest Reference Range & Units 12/15/22 16:05 07/21/23 15:23 12/21/23 15:21  Hemoglobin A1C 4.0 - 5.6 % 6.3 ! 7.0 ! Pend 6.6 !  !: Data is abnormal

## 2023-12-21 NOTE — Progress Notes (Signed)
 Outpatient Endocrinology Note Derek Emberly Tomasso, MD   Patient's Name: Derek [name]    DOB: 1946/05/04    MRN: [id number]                                                    REASON OF VISIT: Follow up for type 2 diabetes mellitus  PCP: Jacqueline Matsu, MD  HISTORY OF PRESENT ILLNESS:   Derek [name] is a 78 y.o. old male with past medical history listed below, is here for follow up for type 2 diabetes mellitus.   Pertinent Diabetes History: Patient was previously seen by Dr. Hubert Madden and was last time seen in June 2024.  Patient was diagnosed with type 2 diabetes mellitus in 2008.  He was initially treated with metformin  and Januvia  was added in 2011.  He has a relatively controlled type 2 diabetes mellitus.  Chronic Diabetes Complications : Retinopathy: no. Last ophthalmology exam was done on annually 10/2023, note reviewed, following with ophthalmology regularly.  Nephropathy: no Peripheral neuropathy: yes, following with podiatry. Coronary artery disease: no Stroke: no  Relevant comorbidities and cardiovascular risk factors: Obesity: no Body mass index is 24.69 kg/m.  Hypertension: Yes  Hyperlipidemia : Yes, on statin   Current / Home Diabetic regimen includes:  Janumet  50/1000 mg 1 tablet daily.  Prior diabetic medications:  Glycemic data:   Glucometer data from May 26 to December 21, 2023, average blood sugar 131.  Lowest blood sugar 116 and highest blood sugar 153.  He has been taking mostly in the morning fasting, some of the other blood sugar 126, 125, 120, 122, 145, 132, 136.  Hypoglycemia: Patient has no hypoglycemic episodes. Patient has hypoglycemia awareness.  Factors modifying glucose control: 1.  Diabetic diet assessment: 2-3 meals a day.   2.  Staying active or exercising: walking and going to gym.   3.  Medication compliance: compliant all of the time.  Interval history  Hemoglobin A1c today 6.6%.  Glucometer data as reviewed above.  Mostly acceptable blood sugar.   He has been taking Janumet  1 tablet daily.  She denies any GI issues.  No numbness and tingling of the feet.  No vision problem.  No other complaints today.  He had diabetic eye exam in April reviewed ophthalmology note and scanned into media.  REVIEW OF SYSTEMS As per history of present illness.   PAST MEDICAL HISTORY: Past Medical History:  Diagnosis Date   Allergy    Anxiety    Arrhythmia    Asthma    Cataract    bilateral - just watching - no treatment yet   Chronic headaches    COPD (chronic obstructive pulmonary disease) (HCC)    COPD (chronic obstructive pulmonary disease) (HCC)    Depression    Diabetes (HCC)    Diverticulosis    GERD (gastroesophageal reflux disease)    Glaucoma    Hyperlipidemia    Hypertension    IBS (irritable bowel syndrome)    Pneumonia    Sleep apnea    does not use CPAP    PAST SURGICAL HISTORY: Past Surgical History:  Procedure Laterality Date   COLONOSCOPY     ESOPHAGOGASTRODUODENOSCOPY ENDOSCOPY     LUMBAR SPINE SURGERY  2010   ROTATOR CUFF REPAIR  2008   urology surgery      ALLERGIES: Allergies  Allergen Reactions   Penicillins Rash    FAMILY HISTORY:  Family History  Problem Relation Age of Onset   Arthritis Mother    Hypertension Mother    Heart failure Father    Diabetes Other    Colon cancer Neg Hx    Colon polyps Neg Hx    Stomach cancer Neg Hx    Rectal cancer Neg Hx     SOCIAL HISTORY: Social History   Socioeconomic History   Marital status: Married    Spouse name: Not on file   Number of children: 2   Years of education: Not on file   Highest education level: Not on file  Occupational History   Occupation: Retired  Tobacco Use   Smoking status: Never   Smokeless tobacco: Never  Vaping Use   Vaping status: Never Used  Substance and Sexual Activity   Alcohol use: Not Currently    Alcohol/week: 2.0 standard drinks of alcohol    Types: 2 Shots of liquor per week   Drug use: No   Sexual activity:  Not on file  Other Topics Concern   Not on file  Social History Narrative   Not on file   Social Drivers of Health   Financial Resource Strain: Not on file  Food Insecurity: Not on file  Transportation Needs: Not on file  Physical Activity: Not on file  Stress: Not on file  Social Connections: Not on file    MEDICATIONS:  Current Outpatient Medications  Medication Sig Dispense Refill   Accu-Chek FastClix Lancets MISC Use Accu Chek fastclix to check blood sugar once daily. DX:E11.9 100 each 2   aspirin 81 MG tablet Take 81 mg by mouth daily.     Blood Glucose Monitoring Suppl (ACCU-CHEK GUIDE ME) w/Device KIT 1 each by Does not apply route daily. Use to check blood sugar once daily. DX:E11.9 1 kit 0   cetirizine (ZYRTEC ALLERGY) 10 MG tablet      chlordiazePOXIDE (LIBRIUM) 25 MG capsule Take 25 mg by mouth daily as needed.  1   doxycycline  (VIBRA -TABS) 100 MG tablet Take 100 mg by mouth 2 (two) times daily.     esomeprazole  (NEXIUM ) 20 MG capsule Take 20 mg by mouth 2 (two) times daily.  4   glucose blood (ACCU-CHEK GUIDE) test strip Use Accu Chek Guide test strips as instructed to check blood sugar once daily. DX:E11.9 100 each 2   HYDROcodone-acetaminophen (NORCO/VICODIN) 5-325 MG per tablet Take 1 tablet by mouth every 6 (six) hours as needed for pain.     irbesartan-hydrochlorothiazide (AVALIDE) 150-12.5 MG per tablet Take 1 tablet by mouth daily at 12 noon.      LUMIGAN 0.01 % SOLN   11   meclizine (ANTIVERT) 25 MG tablet Take 25 mg by mouth as needed.  4   metoprolol  succinate (TOPROL -XL) 25 MG 24 hr tablet Take 1 tablet (25 mg total) by mouth daily. 90 tablet 3   potassium chloride (K-DUR,KLOR-CON) 10 MEQ tablet Take 10 mEq by mouth daily.      PROLENSA 0.07 % SOLN Apply to eye.     sildenafil (VIAGRA) 100 MG tablet Take 100 mg by mouth as needed for erectile dysfunction.     simvastatin (ZOCOR) 20 MG tablet TAKE 1 TABLET BY MOUTH EVERYDAY AT BEDTIME  3    sitaGLIPtin -metformin  (JANUMET ) 50-1000 MG tablet Take 1 tablet by mouth daily. 90 tablet 3   SYMBICORT 160-4.5 MCG/ACT inhaler INHALE 2 PUFFS INTO THE LUNGS EVERY DAY  4   tamsulosin (FLOMAX) 0.4 MG CAPS capsule Take 0.4 mg by mouth daily.     triamcinolone  cream (KENALOG ) 0.1 %      triamcinolone  ointment (KENALOG ) 0.1 % Apply 1 application topically 2 (two) times daily. 30 g 0   VENTOLIN HFA 108 (90 BASE) MCG/ACT inhaler   4   zolpidem (AMBIEN) 10 MG tablet Take 10 mg by mouth at bedtime as needed for sleep.     No current facility-administered medications for this visit.    PHYSICAL EXAM: Vitals:   12/21/23 1518  BP: 126/80  Pulse: 86  Resp: 16  SpO2: 99%  Weight: 177 lb (80.3 kg)  Height: 5\' 11"  (1.803 m)    Body mass index is 24.69 kg/m.  Wt Readings from Last 3 Encounters:  12/21/23 177 lb (80.3 kg)  07/21/23 176 lb (79.8 kg)  06/26/23 179 lb 6.4 oz (81.4 kg)    General: Well developed, well nourished male in no apparent distress.  HEENT: AT/[place], no external lesions.  Eyes: Conjunctiva clear and no icterus. Neck: Neck supple  Lungs: Respirations not labored Neurologic: Alert, oriented, normal speech Extremities / Skin: Dry. Psychiatric: Does not appear depressed or anxious  Diabetic Foot Exam - Simple   No data filed    LABS Reviewed Lab Results  Component Value Date   HGBA1C 6.6 (A) 12/21/2023   HGBA1C 7.0 (A) 07/21/2023   HGBA1C 6.3 (A) 12/15/2022   Lab Results  Component Value Date   FRUCTOSAMINE 269 12/10/2022   Lab Results  Component Value Date   CHOL 141 08/26/2022   HDL 36.40 (L) 08/26/2022   LDLCALC 69 08/15/2021   LDLDIRECT 80.0 08/26/2022   TRIG 217.0 (H) 08/26/2022   CHOLHDL 4 08/26/2022   Lab Results  Component Value Date   MICRALBCREAT 7 07/21/2023   MICRALBCREAT 1.5 08/26/2022   Lab Results  Component Value Date   CREATININE 0.94 07/21/2023   Lab Results  Component Value Date   GFR 61.73 12/10/2022    ASSESSMENT /  PLAN  1. Controlled type 2 diabetes mellitus with complication, without long-term current use of insulin (HCC)     Diabetes Mellitus type 2, complicated by peripheral neuropathy. - Diabetic status / severity: Fair control.  Lab Results  Component Value Date   HGBA1C 6.6 (A) 12/21/2023    - Hemoglobin A1c goal : <6.5%  - Medications: No change  I) Janumet  50/1000 mg 1 tablet daily.  - Home glucose testing: In the morning fasting and asked to bring the glucometer in the follow-up visit. - Discussed/ Gave Hypoglycemia treatment plan.  # Consult : not required at this time.   # Annual urine for microalbuminuria/ creatinine ratio, no microalbuminuria currently .  Last  Lab Results  Component Value Date   MICRALBCREAT 7 07/21/2023    # Foot check nightly / neuropathy, follow-up with podiatry.  # Annual dilated diabetic eye exams.   - Diet: Make healthy diabetic food choices - Life style / activity / exercise: Discussed.  2. Blood pressure  -  BP Readings from Last 1 Encounters:  12/21/23 126/80    - Control is in target.  - No change in current plans.  3. Lipid status / Hyperlipidemia - Last  Lab Results  Component Value Date   LDLCALC 69 08/15/2021   - Continue simvastatin 40 mg daily.  Managed by primary care provider.  Diagnoses and all orders for this visit:  Controlled type 2 diabetes mellitus with complication, without long-term  current use of insulin (HCC) -     POCT glycosylated hemoglobin (Hb A1C)    DISPOSITION Follow up in clinic in 6  months suggested.  Labs on the same day of the visit.   All questions answered and patient verbalized understanding of the plan.  Derek Atianna Haidar, MD [facility] [place] [facility] [address] [place], [address] [place], [place] [address] Phone # [phone]  At least part of this note was generated using voice recognition software. Inadvertent word errors may have occurred, which were not recognized  during the proofreading process.

## 2023-12-22 ENCOUNTER — Ambulatory Visit (INDEPENDENT_AMBULATORY_CARE_PROVIDER_SITE_OTHER): Admitting: Podiatry

## 2023-12-22 DIAGNOSIS — B351 Tinea unguium: Secondary | ICD-10-CM

## 2023-12-22 DIAGNOSIS — E119 Type 2 diabetes mellitus without complications: Secondary | ICD-10-CM

## 2023-12-22 DIAGNOSIS — M79674 Pain in right toe(s): Secondary | ICD-10-CM

## 2023-12-22 DIAGNOSIS — M79675 Pain in left toe(s): Secondary | ICD-10-CM

## 2023-12-22 NOTE — Progress Notes (Signed)
This patient returns to my office for at risk foot care.  This patient requires this care by a professional since this patient will be at risk due to having diabetes.  This patient is unable to cut nails himself since the patient cannot reach his nails.These nails are painful walking and wearing shoes.  This patient presents for at risk foot care today.  General Appearance  Alert, conversant and in no acute stress.  Vascular  Dorsalis pedis and posterior tibial  pulses are palpable  bilaterally.  Capillary return is within normal limits  bilaterally. Temperature is within normal limits  bilaterally.  Neurologic  Senn-Weinstein monofilament wire test within normal limits  bilaterally. Muscle power within normal limits bilaterally.  Nails Thick disfigured discolored nails with subungual debris  from second  to fifth toes bilaterally. No evidence of bacterial infection or drainage bilaterally.  Orthopedic  No limitations of motion  feet .  No crepitus or effusions noted.  No bony pathology or digital deformities noted.  Skin  normotropic skin with no porokeratosis noted bilaterally.  No signs of infections or ulcers noted.     Onychomycosis  Pain in right toes  Pain in left toes  Consent was obtained for treatment procedures.   Mechanical debridement of nails 2-5  bilaterally performed with a nail nipper.  Filed with dremel without incident.    Return office visit    3 months.                 Told patient to return for periodic foot care and evaluation due to potential at risk complications.   Keenan Dimitrov DPM  

## 2024-03-23 ENCOUNTER — Ambulatory Visit: Admitting: Podiatry

## 2024-04-05 ENCOUNTER — Encounter: Payer: Self-pay | Admitting: Podiatry

## 2024-04-05 ENCOUNTER — Ambulatory Visit (INDEPENDENT_AMBULATORY_CARE_PROVIDER_SITE_OTHER): Admitting: Podiatry

## 2024-04-05 DIAGNOSIS — E119 Type 2 diabetes mellitus without complications: Secondary | ICD-10-CM

## 2024-04-05 DIAGNOSIS — M79674 Pain in right toe(s): Secondary | ICD-10-CM

## 2024-04-05 DIAGNOSIS — M79675 Pain in left toe(s): Secondary | ICD-10-CM

## 2024-04-05 DIAGNOSIS — B351 Tinea unguium: Secondary | ICD-10-CM

## 2024-04-05 NOTE — Progress Notes (Signed)
 This patient returns to my office for at risk foot care.  This patient requires this care by a professional since this patient will be at risk due to having diabetes.  This patient is unable to cut nails himself since the patient cannot reach his nails.These nails are painful walking and wearing shoes.  This patient presents for at risk foot care today.  General Appearance  Alert, conversant and in no acute stress.  Vascular  Dorsalis pedis and posterior tibial  pulses are palpable  bilaterally.  Capillary return is within normal limits  bilaterally. Temperature is within normal limits  bilaterally.  Neurologic  Senn-Weinstein monofilament wire test within normal limits  bilaterally. Muscle power within normal limits bilaterally.  Nails Thick disfigured discolored nails with subungual debris  from second  to fifth toes bilaterally. No evidence of bacterial infection or drainage bilaterally.  Orthopedic  No limitations of motion  feet .  No crepitus or effusions noted.  No bony pathology or digital deformities noted.  Skin  normotropic skin with no porokeratosis noted bilaterally.  No signs of infections or ulcers noted.     Onychomycosis  Pain in right toes  Pain in left toes  Consent was obtained for treatment procedures.   Mechanical debridement of nails 2-5  bilaterally performed with a nail nipper.  Filed with dremel without incident.    Return office visit    3 months.                 Told patient to return for periodic foot care and evaluation due to potential at risk complications.   Cordella Bold DPM tomma

## 2024-06-20 ENCOUNTER — Encounter: Payer: Self-pay | Admitting: Cardiovascular Disease

## 2024-06-20 ENCOUNTER — Ambulatory Visit: Attending: Cardiovascular Disease | Admitting: Cardiovascular Disease

## 2024-06-20 VITALS — BP 128/74 | Ht 70.5 in | Wt 178.6 lb

## 2024-06-20 DIAGNOSIS — E119 Type 2 diabetes mellitus without complications: Secondary | ICD-10-CM

## 2024-06-20 DIAGNOSIS — I10 Essential (primary) hypertension: Secondary | ICD-10-CM

## 2024-06-20 DIAGNOSIS — E785 Hyperlipidemia, unspecified: Secondary | ICD-10-CM

## 2024-06-20 MED ORDER — METOPROLOL SUCCINATE ER 25 MG PO TB24: 25.0000 mg | ORAL_TABLET | Freq: Every day | ORAL | 3 refills | Status: AC

## 2024-06-20 MED ORDER — IRBESARTAN-HYDROCHLOROTHIAZIDE 150-12.5 MG PO TABS: 1.0000 | ORAL_TABLET | Freq: Every day | ORAL | 3 refills | Status: AC

## 2024-06-20 MED ORDER — SIMVASTATIN 20 MG PO TABS: 20.0000 mg | ORAL_TABLET | Freq: Every day | ORAL | 3 refills | Status: AC

## 2024-06-20 MED ORDER — POTASSIUM CHLORIDE CRYS ER 10 MEQ PO TBCR: 10.0000 meq | EXTENDED_RELEASE_TABLET | Freq: Every day | ORAL | 3 refills | Status: AC

## 2024-06-20 NOTE — Patient Instructions (Signed)
 Medication Instructions:  No changes *If you need a refill on your cardiac medications before your next appointment, please call your pharmacy*  Lab Work: Fasting lipid panel If you have labs (blood work) drawn today and your tests are completely normal, you will receive your results only by: MyChart Message (if you have MyChart) OR A paper copy in the mail If you have any lab test that is abnormal or we need to change your treatment, we will call you to review the results.  Testing/Procedures: None ordered  Follow-Up: At [facility], you and your health needs are our priority.  As part of our continuing mission to provide you with exceptional heart care, our providers are all part of one team.  This team includes your primary Cardiologist (physician) and Advanced Practice Providers or APPs (Physician Assistants and Nurse Practitioners) who all work together to provide you with the care you need, when you need it.  Your next appointment:   1 year(s)  Provider:   Jerel Balding, MD    We recommend signing up for the patient portal called MyChart.  Sign up information is provided on this After Visit Summary.  MyChart is used to connect with patients for Virtual Visits (Telemedicine).  Patients are able to view lab/test results, encounter notes, upcoming appointments, etc.  Non-urgent messages can be sent to your provider as well.   To learn more about what you can do with MyChart, go to [email or web address].

## 2024-06-20 NOTE — Progress Notes (Unsigned)
 Cardiology Office Note   Date:  06/20/2024   ID:  Derek [name], DOB 1945/11/15, MRN [id number]  PCP:  Derek Bare, MD  Cardiologist:  Jerel Balding, MD  Electrophysiologist:  None   Evaluation Performed:  Follow-Up Visit  Chief Complaint:  HTN, HLP  History of Present Illness:    Derek [name] is a 78 y.o. male with type 2 diabetes mellitus, hypertension and dyslipidemia.  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  He has no cardiovascular complaints.   The patient specifically denies any chest pain at rest or with exertion, dyspnea at rest or with exertion, orthopnea, paroxysmal nocturnal dyspnea, syncope, palpitations, focal neurological deficits, intermittent claudication, lower extremity edema, unexplained weight gain, cough, hemoptysis or wheezing.  Earlier this year he became more lax with his diet and activity level.  His hemoglobin A1c increased to 8.8% and his lipid profile deteriorated.  After meeting with his new endocrinologist he has been doing a better job with dietary control and exercise and his hemoglobin A1c has decreased back down to 6.3%.  He has not yet had a repeat lipid profile.  His blood pressure remains very well-controlled.   Past Medical History:  Diagnosis Date   Allergy    Anxiety    Arrhythmia    Asthma    Cataract    bilateral - just watching - no treatment yet   Chronic headaches    COPD (chronic obstructive pulmonary disease) (HCC)    COPD (chronic obstructive pulmonary disease) (HCC)    Depression    Diabetes (HCC)    Diverticulosis    GERD (gastroesophageal reflux disease)    Glaucoma    Hyperlipidemia    Hypertension    IBS (irritable bowel syndrome)    Pneumonia    Sleep apnea    does not use CPAP   Past Surgical History:  Procedure Laterality Date   COLONOSCOPY     ESOPHAGOGASTRODUODENOSCOPY ENDOSCOPY     LUMBAR SPINE SURGERY  2010   ROTATOR CUFF REPAIR  2008    urology surgery       Current Meds  Medication Sig   Accu-Chek FastClix Lancets MISC Use Accu Chek fastclix to check blood sugar once daily. DX:E11.9   aspirin 81 MG tablet Take 81 mg by mouth daily.   Blood Glucose Monitoring Suppl (ACCU-CHEK GUIDE ME) w/Device KIT 1 each by Does not apply route daily. Use to check blood sugar once daily. DX:E11.9   chlordiazePOXIDE (LIBRIUM) 25 MG capsule Take 25 mg by mouth daily as needed.   esomeprazole  (NEXIUM ) 20 MG capsule Take 20 mg by mouth 2 (two) times daily.   glucose blood (ACCU-CHEK GUIDE) test strip Use Accu Chek Guide test strips as instructed to check blood sugar once daily. DX:E11.9   HYDROcodone-acetaminophen (NORCO/VICODIN) 5-325 MG per tablet Take 1 tablet by mouth every 6 (six) hours as needed for pain.   ipratropium (ATROVENT) 0.06 % nasal spray Place 2 sprays into both nostrils 2 (two) times daily as needed for rhinitis.   irbesartan -hydrochlorothiazide  (AVALIDE) 150-12.5 MG per tablet Take 1 tablet by mouth daily at 12 noon.    LUMIGAN 0.01 % SOLN    metoprolol  succinate (TOPROL -XL) 25 MG 24 hr tablet Take 1 tablet (25 mg total) by mouth daily.   potassium chloride  (K-DUR,KLOR-CON ) 10 MEQ tablet Take 10 mEq by mouth daily.    PROLENSA 0.07 % SOLN Apply to eye.  sildenafil (VIAGRA) 100 MG tablet Take 100 mg by mouth as needed for erectile dysfunction.   simvastatin  (ZOCOR ) 20 MG tablet TAKE 1 TABLET BY MOUTH EVERYDAY AT BEDTIME   sitaGLIPtin -metformin  (JANUMET ) 50-1000 MG tablet Take 1 tablet by mouth daily.   tamsulosin (FLOMAX) 0.4 MG CAPS capsule Take 0.4 mg by mouth daily.   VENTOLIN HFA 108 (90 BASE) MCG/ACT inhaler    zolpidem (AMBIEN) 10 MG tablet Take 10 mg by mouth at bedtime as needed for sleep.     Allergies:   Penicillins   Social History   Tobacco Use   Smoking status: Never   Smokeless tobacco: Never  Vaping Use   Vaping status: Never Used  Substance Use Topics   Alcohol use: Not Currently     Alcohol/week: 2.0 standard drinks of alcohol    Types: 2 Shots of liquor per week   Drug use: No     Family Hx: The patient's family history includes Arthritis in his mother; Diabetes in an other family member; Heart failure in his father; Hypertension in his mother. There is no history of Colon cancer, Colon polyps, Stomach cancer, or Rectal cancer.  ROS:   Please see the history of present illness.     All other systems reviewed and are negative.   Prior CV studies:   The following studies were reviewed today: Labs from Dr. Von  Labs/Other Tests and Data Reviewed:    EKG: performed today, shows NSR, normal tracing  Recent Labs: 07/21/2023: BUN 15; Creat 0.94; Potassium 4.4; Sodium 141   Recent Lipid Panel Lab Results  Component Value Date/Time   CHOL 141 08/26/2022 03:04 PM   TRIG 217.0 (H) 08/26/2022 03:04 PM   HDL 36.40 (L) 08/26/2022 03:04 PM   CHOLHDL 4 08/26/2022 03:04 PM   LDLCALC 69 08/15/2021 03:02 PM   LDLDIRECT 80.0 08/26/2022 03:04 PM    Wt Readings from Last 3 Encounters:  06/20/24 178 lb 9.6 oz (81 kg)  12/21/23 177 lb (80.3 kg)  07/21/23 176 lb (79.8 kg)     Objective:    Vital Signs:  Ht 5' 10.5 (1.791 m)   Wt 178 lb 9.6 oz (81 kg)   SpO2 98%   BMI 25.26 kg/m      General: Alert, oriented x3, no distress, appears fit, looks younger than stated age Head: no evidence of trauma, PERRL, EOMI, no exophtalmos or lid lag, no myxedema, no xanthelasma; normal ears, nose and oropharynx Neck: normal jugular venous pulsations and no hepatojugular reflux; brisk carotid pulses without delay and no carotid bruits Chest: clear to auscultation, no signs of consolidation by percussion or palpation, normal fremitus, symmetrical and full respiratory excursions Cardiovascular: normal position and quality of the apical impulse, regular rhythm, normal first and second heart sounds, no murmurs, rubs or gallops Abdomen: no tenderness or distention, no masses by  palpation, no abnormal pulsatility or arterial bruits, normal bowel sounds, no hepatosplenomegaly Extremities: no clubbing, cyanosis or edema; 2+ radial, ulnar and brachial pulses bilaterally; 2+ right femoral, posterior tibial and dorsalis pedis pulses; 2+ left femoral, posterior tibial and dorsalis pedis pulses; no subclavian or femoral bruits Neurological: grossly nonfocal Psych: Normal mood and affect    ASSESSMENT & PLAN:    1. Essential hypertension   2. Type 2 diabetes mellitus without complication, without long-term current use of insulin (HCC)   3. Dyslipidemia (high LDL; low HDL)      HTN: Well-controlled DM: Deteriorated in February, but has subsequently improved with a  most recent A1c level of 6.3% HLP: All his lipid parameters looked worse and February with an LDL cholesterol of 80, HDL 36, mildly elevated triglycerides to 117.  These have not been repeated since the improvement in hemoglobin A1c.  He will have labs again in January when he sees his endocrinologist.  Encouraged him to remain physically active, avoid sweets and starches with high glycemic index, prefer lean protein, non-starchy vegetables and starchy foods that are made with whole grains instead.  Continue statin.   There are no Patient Instructions on file for this visit.   Tests Ordered: Orders Placed This Encounter  Procedures   EKG 12-Lead    Medication Changes: No orders of the defined types were placed in this encounter.   Follow Up:  In Person 1 year  Signed, Jerel Balding, MD  06/20/2024 2:30 PM    [facility]

## 2024-06-21 ENCOUNTER — Ambulatory Visit: Admitting: Endocrinology

## 2024-06-21 ENCOUNTER — Telehealth: Payer: Self-pay | Admitting: Cardiovascular Disease

## 2024-06-21 ENCOUNTER — Other Ambulatory Visit

## 2024-06-21 ENCOUNTER — Encounter: Payer: Self-pay | Admitting: Endocrinology

## 2024-06-21 ENCOUNTER — Ambulatory Visit: Payer: Self-pay | Admitting: Endocrinology

## 2024-06-21 VITALS — BP 136/70 | HR 89 | Resp 16 | Ht 70.5 in | Wt 180.0 lb

## 2024-06-21 DIAGNOSIS — E118 Type 2 diabetes mellitus with unspecified complications: Secondary | ICD-10-CM

## 2024-06-21 DIAGNOSIS — E7800 Pure hypercholesterolemia, unspecified: Secondary | ICD-10-CM

## 2024-06-21 DIAGNOSIS — Z7984 Long term (current) use of oral hypoglycemic drugs: Secondary | ICD-10-CM | POA: Diagnosis not present

## 2024-06-21 DIAGNOSIS — E1142 Type 2 diabetes mellitus with diabetic polyneuropathy: Secondary | ICD-10-CM | POA: Diagnosis not present

## 2024-06-21 LAB — POCT GLYCOSYLATED HEMOGLOBIN (HGB A1C): Hemoglobin A1C: 7.1 % — AB (ref 4.0–5.6)

## 2024-06-21 NOTE — Telephone Encounter (Signed)
 Left message for patient to call back. Pt will need to get this Rx refill from PCP.  [place], RN [facility] Triage

## 2024-06-21 NOTE — Telephone Encounter (Signed)
*  STAT* If patient is at the pharmacy, call can be transferred to refill team.   1. Which medications need to be refilled? (please list name of each medication and dose if known) esomeprazole  (NEXIUM ) 20 MG capsule    2. Would you like to learn more about the convenience, safety, & potential cost savings by using the [facility]?     3. Are you open to using the [facility] (Type [facility]. ).   4. Which pharmacy/location (including street and city if local pharmacy) is medication to be sent to? [id number] - [place], [place] - [address]    5. Do they need a 30 day or 90 day supply? 90 day

## 2024-06-21 NOTE — Progress Notes (Signed)
 Outpatient Endocrinology Note Valentin Benney, MD   Patient's Name: Derek [name]    DOB: 1946/01/20    MRN: [id number]                                                    REASON OF VISIT: Follow up for type 2 diabetes mellitus  PCP: Hillman Bare, MD  HISTORY OF PRESENT ILLNESS:   KORON GODEAUX is a 78 y.o. old male with past medical history listed below, is here for follow up for type 2 diabetes mellitus.   Pertinent Diabetes History: Patient was previously seen by Dr. Von and was last time seen in June 2024.  Patient was diagnosed with type 2 diabetes mellitus in 2008.  He was initially treated with metformin  and Januvia  was added in 2011.  He has a relatively controlled type 2 diabetes mellitus.  Chronic Diabetes Complications : Retinopathy: no. Last ophthalmology exam was done on annually 10/2023, note reviewed, following with ophthalmology regularly.  Nephropathy: no Peripheral neuropathy: yes, following with podiatry. Coronary artery disease: no Stroke: no  Relevant comorbidities and cardiovascular risk factors: Obesity: no Body mass index is 25.46 kg/m.  Hypertension: Yes  Hyperlipidemia : Yes, on statin   Current / Home Diabetic regimen includes:  Janumet  50/1000 mg 1 tablet daily.  Prior diabetic medications:  Glycemic data:   Glucometer data from November 25 to December 9 , 2025, average blood sugar 143, blood sugar 139, 151, 130 8 in the morning fasting.   Hypoglycemia: Patient has no hypoglycemic episodes. Patient has hypoglycemia awareness.  Factors modifying glucose control: 1.  Diabetic diet assessment: 2-3 meals a day.   2.  Staying active or exercising: walking and going to gym.   3.  Medication compliance: compliant all of the time.  Interval history  Hemoglobin A1c 7.1% today.  Glucometer data as reviewed above.  He has been taking Janumet .  He denies numbness and tingling to feet.  No vision problem.  No other complaints today.   REVIEW OF  SYSTEMS As per history of present illness.   PAST MEDICAL HISTORY: Past Medical History:  Diagnosis Date   Allergy    Anxiety    Arrhythmia    Asthma    Cataract    bilateral - just watching - no treatment yet   Chronic headaches    COPD (chronic obstructive pulmonary disease) (HCC)    COPD (chronic obstructive pulmonary disease) (HCC)    Depression    Diabetes (HCC)    Diverticulosis    GERD (gastroesophageal reflux disease)    Glaucoma    Hyperlipidemia    Hypertension    IBS (irritable bowel syndrome)    Pneumonia    Sleep apnea    does not use CPAP    PAST SURGICAL HISTORY: Past Surgical History:  Procedure Laterality Date   COLONOSCOPY     ESOPHAGOGASTRODUODENOSCOPY ENDOSCOPY     LUMBAR SPINE SURGERY  2010   ROTATOR CUFF REPAIR  2008   urology surgery      ALLERGIES: Allergies  Allergen Reactions   Penicillins Rash    FAMILY HISTORY:  Family History  Problem Relation Age of Onset   Arthritis Mother    Hypertension Mother    Heart failure Father    Diabetes Other    Colon cancer Neg Hx  Colon polyps Neg Hx    Stomach cancer Neg Hx    Rectal cancer Neg Hx     SOCIAL HISTORY: Social History   Socioeconomic History   Marital status: Married    Spouse name: Not on file   Number of children: 2   Years of education: Not on file   Highest education level: Not on file  Occupational History   Occupation: Retired  Tobacco Use   Smoking status: Never   Smokeless tobacco: Never  Vaping Use   Vaping status: Never Used  Substance and Sexual Activity   Alcohol use: Not Currently    Alcohol/week: 2.0 standard drinks of alcohol    Types: 2 Shots of liquor per week   Drug use: No   Sexual activity: Not on file  Other Topics Concern   Not on file  Social History Narrative   Not on file   Social Drivers of Health   Financial Resource Strain: Not on file  Food Insecurity: Not on file  Transportation Needs: Not on file  Physical Activity: Not  on file  Stress: Not on file  Social Connections: Not on file    MEDICATIONS:  Current Outpatient Medications  Medication Sig Dispense Refill   Accu-Chek FastClix Lancets MISC Use Accu Chek fastclix to check blood sugar once daily. DX:E11.9 100 each 2   aspirin 81 MG tablet Take 81 mg by mouth daily.     Blood Glucose Monitoring Suppl (ACCU-CHEK GUIDE ME) w/Device KIT 1 each by Does not apply route daily. Use to check blood sugar once daily. DX:E11.9 1 kit 0   cetirizine (ZYRTEC ALLERGY) 10 MG tablet      chlordiazePOXIDE (LIBRIUM) 25 MG capsule Take 25 mg by mouth daily as needed.  1   doxycycline  (VIBRA -TABS) 100 MG tablet Take 100 mg by mouth 2 (two) times daily.     esomeprazole  (NEXIUM ) 20 MG capsule Take 20 mg by mouth 2 (two) times daily.  4   glucose blood (ACCU-CHEK GUIDE) test strip Use Accu Chek Guide test strips as instructed to check blood sugar once daily. DX:E11.9 100 each 2   HYDROcodone-acetaminophen (NORCO/VICODIN) 5-325 MG per tablet Take 1 tablet by mouth every 6 (six) hours as needed for pain.     ipratropium (ATROVENT) 0.06 % nasal spray Place 2 sprays into both nostrils 2 (two) times daily as needed for rhinitis.     irbesartan -hydrochlorothiazide  (AVALIDE) 150-12.5 MG tablet Take 1 tablet by mouth daily at 12 noon. 90 tablet 3   LUMIGAN 0.01 % SOLN   11   meclizine (ANTIVERT) 25 MG tablet Take 25 mg by mouth as needed.  4   metoprolol  succinate (TOPROL -XL) 25 MG 24 hr tablet Take 1 tablet (25 mg total) by mouth daily. 90 tablet 3   potassium chloride  (KLOR-CON  M) 10 MEQ tablet Take 1 tablet (10 mEq total) by mouth daily. 90 tablet 3   PROLENSA 0.07 % SOLN Apply to eye.     sildenafil (VIAGRA) 100 MG tablet Take 100 mg by mouth as needed for erectile dysfunction.     simvastatin  (ZOCOR ) 20 MG tablet Take 1 tablet (20 mg total) by mouth daily at 6 PM. 90 tablet 3   sitaGLIPtin -metformin  (JANUMET ) 50-1000 MG tablet Take 1 tablet by mouth daily. 90 tablet 3   SYMBICORT  160-4.5 MCG/ACT inhaler INHALE 2 PUFFS INTO THE LUNGS EVERY DAY  4   tamsulosin (FLOMAX) 0.4 MG CAPS capsule Take 0.4 mg by mouth daily.  triamcinolone  cream (KENALOG ) 0.1 %      triamcinolone  ointment (KENALOG ) 0.1 % Apply 1 application topically 2 (two) times daily. 30 g 0   VENTOLIN HFA 108 (90 BASE) MCG/ACT inhaler   4   zolpidem (AMBIEN) 10 MG tablet Take 10 mg by mouth at bedtime as needed for sleep.     No current facility-administered medications for this visit.    PHYSICAL EXAM: Vitals:   06/21/24 1404  BP: 136/70  Pulse: 89  Resp: 16  SpO2: 95%  Weight: 180 lb (81.6 kg)  Height: 5' 10.5 (1.791 m)    Body mass index is 25.46 kg/m.  Wt Readings from Last 3 Encounters:  06/21/24 180 lb (81.6 kg)  06/20/24 178 lb 9.6 oz (81 kg)  12/21/23 177 lb (80.3 kg)    General: Well developed, well nourished male in no apparent distress.  HEENT: AT/[place], no external lesions.  Eyes: Conjunctiva clear and no icterus. Neck: Neck supple  Lungs: Respirations not labored Neurologic: Alert, oriented, normal speech Extremities / Skin: Dry. Psychiatric: Does not appear depressed or anxious  Diabetic Foot Exam - Simple   Simple Foot Form Diabetic Foot exam was performed with the following findings: Yes 06/21/2024  2:24 PM  Visual Inspection No deformities, no ulcerations, no other skin breakdown bilaterally: Yes See comments: Yes Sensation Testing Intact to touch and monofilament testing bilaterally: Yes Pulse Check Posterior Tibialis and Dorsalis pulse intact bilaterally: Yes Comments Dystrophic nails b/l. Hammer toes bilaterally.     LABS Reviewed Lab Results  Component Value Date   HGBA1C 7.1 (A) 06/21/2024   HGBA1C 6.6 (A) 12/21/2023   HGBA1C 7.0 (A) 07/21/2023   Lab Results  Component Value Date   FRUCTOSAMINE 269 12/10/2022   Lab Results  Component Value Date   CHOL 141 08/26/2022   HDL 36.40 (L) 08/26/2022   LDLCALC 69 08/15/2021   LDLDIRECT 80.0 08/26/2022    TRIG 217.0 (H) 08/26/2022   CHOLHDL 4 08/26/2022   Lab Results  Component Value Date   MICRALBCREAT 7 07/21/2023   Lab Results  Component Value Date   CREATININE 0.94 07/21/2023   Lab Results  Component Value Date   GFR 61.73 12/10/2022    ASSESSMENT / PLAN  1. Controlled diabetes mellitus type 2 with complications (HCC)   2. Pure hypercholesterolemia     Diabetes Mellitus type 2, complicated by peripheral neuropathy. - Diabetic status / severity: Fair control.  Lab Results  Component Value Date   HGBA1C 7.1 (A) 06/21/2024    - Hemoglobin A1c goal : <6.5%  - Medications: No change  I) Janumet  50/1000 mg 1 tablet daily.  - Home glucose testing: In the morning fasting and asked to bring the glucometer in the follow-up visit. - Discussed/ Gave Hypoglycemia treatment plan.  # Consult : not required at this time.   # Annual urine for microalbuminuria/ creatinine ratio, no microalbuminuria currently .  Last  Lab Results  Component Value Date   MICRALBCREAT 7 07/21/2023    # Foot check nightly / neuropathy, follow-up with podiatry.  # Annual dilated diabetic eye exams.   - Diet: Make healthy diabetic food choices - Life style / activity / exercise: Discussed.  2. Blood pressure  -  BP Readings from Last 1 Encounters:  06/21/24 136/70    - Control is in target.  - No change in current plans.  3. Lipid status / Hyperlipidemia - Last  Lab Results  Component Value Date   LDLCALC 86  08/15/2021   - Continue simvastatin  40 mg daily.  Managed by primary care provider.  Diagnoses and all orders for this visit:  Controlled diabetes mellitus type 2 with complications (HCC) -     POCT glycosylated hemoglobin (Hb A1C) -     Basic metabolic panel with GFR -     Microalbumin / creatinine urine ratio  Pure hypercholesterolemia -     Lipid panel    DISPOSITION Follow up in clinic in 6  months suggested.  Labs today as ordered.  All questions answered  and patient verbalized understanding of the plan.  Razi Hickle, MD [facility] [address] [place], [address] [place], [place] [address] Phone # [phone]  At least part of this note was generated using voice recognition software. Inadvertent word errors may have occurred, which were not recognized during the proofreading process.

## 2024-06-22 LAB — LIPID PANEL
Cholesterol: 151 mg/dL (ref <200)
HDL: 44 mg/dL (ref >=40)
LDL Cholesterol (Calc): 74 mg/dL
Non-HDL Cholesterol (Calc): 107 mg/dL (ref <130)
Total CHOL/HDL Ratio: 3.4 (calc) (ref <5.0)
Triglycerides: 241 mg/dL — ABNORMAL HIGH (ref <150)

## 2024-06-22 LAB — BASIC METABOLIC PANEL WITH GFR
BUN: 13 mg/dL (ref 7–25)
CO2: 31 mmol/L (ref 20–32)
Calcium: 9.6 mg/dL (ref 8.6–10.3)
Chloride: 102 mmol/L (ref 98–110)
Creat: 0.83 mg/dL (ref 0.70–1.28)
Glucose, Bld: 159 mg/dL — ABNORMAL HIGH (ref 65–139)
Potassium: 4.2 mmol/L (ref 3.5–5.3)
Sodium: 137 mmol/L (ref 135–146)
eGFR: 89 mL/min/1.73m2 (ref >=60)

## 2024-06-22 LAB — MICROALBUMIN / CREATININE URINE RATIO
Creatinine, Urine: 61 mg/dL (ref 20–320)
Microalb Creat Ratio: 3 mg/g{creat} (ref <30)
Microalb, Ur: 0.2 mg/dL

## 2024-06-22 NOTE — Telephone Encounter (Signed)
 Sent refusal of Nexium  to pharmacy below, letting pt know to get from PCP.

## 2024-06-26 ENCOUNTER — Encounter: Payer: Self-pay | Admitting: Cardiovascular Disease

## 2024-07-05 ENCOUNTER — Encounter: Payer: Self-pay | Admitting: Podiatry

## 2024-07-05 ENCOUNTER — Ambulatory Visit: Admitting: Podiatry

## 2024-07-05 DIAGNOSIS — M79674 Pain in right toe(s): Secondary | ICD-10-CM

## 2024-07-05 DIAGNOSIS — B351 Tinea unguium: Secondary | ICD-10-CM

## 2024-07-05 DIAGNOSIS — E119 Type 2 diabetes mellitus without complications: Secondary | ICD-10-CM | POA: Diagnosis not present

## 2024-07-05 DIAGNOSIS — M79675 Pain in left toe(s): Secondary | ICD-10-CM

## 2024-07-05 NOTE — Progress Notes (Signed)
 This patient returns to my office for at risk foot care.  This patient requires this care by a professional since this patient will be at risk due to having diabetes.  This patient is unable to cut nails himself since the patient cannot reach his nails.These nails are painful walking and wearing shoes.  This patient presents for at risk foot care today.  General Appearance  Alert, conversant and in no acute stress.  Vascular  Dorsalis pedis and posterior tibial  pulses are palpable  bilaterally.  Capillary return is within normal limits  bilaterally. Temperature is within normal limits  bilaterally.  Neurologic  Senn-Weinstein monofilament wire test within normal limits  bilaterally. Muscle power within normal limits bilaterally.  Nails Thick disfigured discolored nails with subungual debris  from second  to fifth toes bilaterally. No evidence of bacterial infection or drainage bilaterally.  Orthopedic  No limitations of motion  feet .  No crepitus or effusions noted.  No bony pathology or digital deformities noted.  Skin  normotropic skin with no porokeratosis noted bilaterally.  No signs of infections or ulcers noted.     Onychomycosis  Pain in right toes  Pain in left toes  Consent was obtained for treatment procedures.   Mechanical debridement of nails 2-5  bilaterally performed with a nail nipper.  Filed with dremel without incident.    Return office visit    3 months.                 Told patient to return for periodic foot care and evaluation due to potential at risk complications.   Cordella Bold DPM tomma

## 2024-10-04 ENCOUNTER — Ambulatory Visit: Admitting: Podiatry

## 2024-12-20 ENCOUNTER — Ambulatory Visit: Admitting: Endocrinology
